# Patient Record
Sex: Female | Born: 1970 | Race: White | Hispanic: No | Marital: Married | Smoking: Former smoker
Health system: Southern US, Community
[De-identification: ages and names within clinical notes are randomized; demographics above are authoritative.]

## PROBLEM LIST (undated history)

## (undated) DIAGNOSIS — F419 Anxiety disorder, unspecified: Secondary | ICD-10-CM

## (undated) DIAGNOSIS — G510 Bell's palsy: Secondary | ICD-10-CM

## (undated) DIAGNOSIS — G43019 Migraine without aura, intractable, without status migrainosus: Principal | ICD-10-CM

## (undated) DIAGNOSIS — F32A Depression, unspecified: Secondary | ICD-10-CM

## (undated) DIAGNOSIS — G709 Myoneural disorder, unspecified: Secondary | ICD-10-CM

## (undated) DIAGNOSIS — F329 Major depressive disorder, single episode, unspecified: Secondary | ICD-10-CM

## (undated) DIAGNOSIS — J45909 Unspecified asthma, uncomplicated: Secondary | ICD-10-CM

## (undated) DIAGNOSIS — M797 Fibromyalgia: Secondary | ICD-10-CM

## (undated) DIAGNOSIS — G500 Trigeminal neuralgia: Secondary | ICD-10-CM

## (undated) DIAGNOSIS — K589 Irritable bowel syndrome without diarrhea: Secondary | ICD-10-CM

## (undated) HISTORY — PX: REDUCTION MAMMAPLASTY: SUR839

## (undated) HISTORY — PX: DILATION AND CURETTAGE OF UTERUS: SHX78

## (undated) HISTORY — PX: COLONOSCOPY: SHX174

## (undated) HISTORY — DX: Fibromyalgia: M79.7

## (undated) HISTORY — PX: WISDOM TOOTH EXTRACTION: SHX21

## (undated) HISTORY — PX: ABLATION: SHX5711

## (undated) HISTORY — DX: Migraine without aura, intractable, without status migrainosus: G43.019

## (undated) HISTORY — PX: OTHER SURGICAL HISTORY: SHX169

---

## 2006-07-11 ENCOUNTER — Emergency Department (HOSPITAL_COMMUNITY): Admission: EM | Admit: 2006-07-11 | Discharge: 2006-07-11 | Payer: Self-pay | Admitting: Emergency Medicine

## 2007-12-29 ENCOUNTER — Inpatient Hospital Stay (HOSPITAL_COMMUNITY): Admission: AD | Admit: 2007-12-29 | Discharge: 2007-12-29 | Payer: Self-pay | Admitting: Obstetrics & Gynecology

## 2008-01-01 ENCOUNTER — Inpatient Hospital Stay (HOSPITAL_COMMUNITY): Admission: AD | Admit: 2008-01-01 | Discharge: 2008-01-01 | Payer: Self-pay | Admitting: Obstetrics

## 2008-01-08 ENCOUNTER — Inpatient Hospital Stay (HOSPITAL_COMMUNITY): Admission: AD | Admit: 2008-01-08 | Discharge: 2008-01-11 | Payer: Self-pay | Admitting: Obstetrics & Gynecology

## 2008-08-03 ENCOUNTER — Encounter: Admission: RE | Admit: 2008-08-03 | Discharge: 2008-08-03 | Payer: Self-pay | Admitting: Obstetrics & Gynecology

## 2008-08-11 ENCOUNTER — Encounter: Admission: RE | Admit: 2008-08-11 | Discharge: 2008-08-11 | Payer: Self-pay | Admitting: Obstetrics & Gynecology

## 2010-04-13 ENCOUNTER — Ambulatory Visit (HOSPITAL_COMMUNITY): Admission: RE | Admit: 2010-04-13 | Discharge: 2010-04-13 | Payer: Self-pay | Admitting: Obstetrics

## 2010-09-20 ENCOUNTER — Other Ambulatory Visit: Payer: Self-pay | Admitting: Family Medicine

## 2010-09-20 ENCOUNTER — Other Ambulatory Visit
Admission: RE | Admit: 2010-09-20 | Discharge: 2010-09-20 | Payer: Self-pay | Source: Home / Self Care | Admitting: Family Medicine

## 2010-11-09 LAB — CBC
HCT: 38.1 % (ref 36.0–46.0)
MCV: 88.7 fL (ref 78.0–100.0)
RDW: 13.4 % (ref 11.5–15.5)

## 2010-11-09 LAB — PREGNANCY, URINE: Preg Test, Ur: NEGATIVE

## 2011-01-08 NOTE — Op Note (Signed)
NAMEHELI, DINO NO.:  [id number]   MEDICAL RECORD NO.:  [id number]          PATIENT TYPE:  INP   LOCATION:  9125                          FACILITY:  [facility]   PHYSICIAN:  Genia Del, M.D.DATE OF BIRTH:  1971/06/24   DATE OF PROCEDURE:  DATE OF DISCHARGE:                               OPERATIVE REPORT   PREOPERATIVE DIAGNOSES:  1. A 39+ weeks' gestation.  2. Spontaneous rupture of membranes.  3. Previous cesarean sections x2.   POSTOPERATIVE DIAGNOSES:  1. A 39+ weeks' gestation.  2. Spontaneous rupture of membranes.  3. Previous cesarean sections x2.   PROCEDURE:  Urgent repeat low-transverse cesarean section.   SURGEON:  Genia Del, MD   ANESTHESIOLOGIST:  Raul Del, MD   PROCEDURE:  Under spinal anesthesia, the patient was in 15-degree left  decubitus position.  She was prepped with Betadine on the abdominal,  suprapubic, vulvar, and vaginal areas.  The bladder was catheterized and  the Foley was left in place, and the patient was draped as usual.  The  level of anesthesia was verified and was adequate.  We infiltrated the  subcutaneous tissue with Marcaine one-quarter plain 20 mL at the site of  the previous C-section.  We make a Pfannenstiel incision at that site  with the scalpel.  We opened the adipose tissue with the electrocautery.  We opened the aponeurosis transversely with the electrocautery and the  Mayo scissors.  The recti muscles were separated from the aponeurosis on  the midline superiorly and inferiorly.  We then opened parietal  peritoneum longitudinally with Metzenbaum scissors.  We then inserted  the disposable circular retractor.  We opened the visceral peritoneum  over the lower uterine segment with Metzenbaum scissors and we reclined  the bladder downward.  A low-transverse hysterotomy was done with the  scalpel.  We extended on each side with dressing scissors.  The amniotic  fluid was clear.  The fetus was  in cephalic presentation.  A loose  nuchal cord was present.  Birth of a baby girl at 2:50.  The cord was  clamped and cut.  The baby was suctioned and given to the neonatal team.  Apgars were 8 and 9, weight was 7 pounds 3 ounces.  Extraction of the  placenta, it was complete with 3-vessel and sent to labor and delivery.  Ancef 1 g IV was given and Pitocin was started and the IV fluids.  The  uterus contracted well.  A uterine revision was done.  We then closed  the hysterotomy in a first locked running suture of Vicryl 0.  A second  plane was done in a mattress fashion with Vicryl 0, and an X stitch was  added at the right angle to complete hemostasis in a figure-of-eight.  We then confirmed that hemostasis was adequate at all levels.  Both  tubes and both ovaries were normal to inspection.  We removed the  retractor.  We then irrigated and suctioned the abdominopelvic cavities.  We closed the aponeurosis in two-half running sutures of Vicryl 0.  We  completed hemostasis at the  adipose  tissue with the electrocautery and reapproximated the skin with  staples.  A dry dressing was applied.  The count of instruments and  sponges was complete x2.  The estimated blood loss was 500 mL.  The  patient was transferred to recovery room in good stable status.  No  complications occurred.      Genia Del, M.D.  Electronically Signed     ML/MEDQ  D:  01/08/2008  T:  01/08/2008  Job:  [id number]

## 2011-01-08 NOTE — Consult Note (Signed)
NAMEJACARI, Shari NO.:  [id number]   MEDICAL RECORD NO.:  [id number]          PATIENT TYPE:  MAT   LOCATION:  MATC                          FACILITY:  [facility]   PHYSICIAN:  [place] B. Earlene Plater, M.D.  DATE OF BIRTH:  [birth year]   DATE OF CONSULTATION:  12/29/2007  DATE OF DISCHARGE:  12/29/2007                                 CONSULTATION   CHIEF COMPLAINT:  Status post fall.   HISTORY OF PRESENT ILLNESS:  A 40 year old white female gravida 4, para  3, presents at 21 weeks, status post fall.  She slept in her garage and  fell to her abdomen left and left knee and hand.  She reports general  soreness in her knee, but otherwise no complaints.  Good fetal  movements.  Occasional contractions, which is mild.  No bleeding or  leakage of fluid.  Prenatal care has otherwise been uncomplicated.   PAST MEDICAL HISTORY:  1. Anemia.  2. Irritable bowel syndrome.  3. Anxiety disorder.   PAST SURGICAL HISTORY:  C-section x2.   MEDICATIONS:  1. Advair.  2. Nexium.  3. Ventolin.  4. Prenatal vitamin.  5. Zyrtec.   ALLERGIES:  None.   SOCIAL HISTORY:  Noncontributory.   OBSTETRICAL HISTORY:  C-section x2.   PHYSICAL EXAMINATION:  The patient is afebrile.  Stable vital signs.  Fetal heart rate tracing shows fetal heart baseline of 140s, moderate  variability, and reactive tracing with an occasional contraction, which  the patient states is not painful.  Abdomen is not bruised.  There is no  fundal tenderness.  Cervix is closed and 50% effaced, vertex  presentation, no blood noted.  Knee is tender just below the patella and  soft tissue with vary subtle soft tissue swelling; otherwise, no  ligamentous tenderness or bony tenderness.  Left ankle is normal in  appearance, nontender.   LABS:  Kleihauer-Betke is negative.  X-rays of the left knee and left  ankle are normal.   ASSESSMENT:  1. Status post fall.  The patient has been monitored for 4 hours  approximately with no worsen signs, also her KB is negative.  She      is discharged home.  Bleeding and pain precautions given.  Follow      up in the office in a week.  2. Left knee and ankle pain.  No evidence of bony injury for      significant ligamentous injury on physical exam or x-rays.      Recommend ice and Tylenol.  If pain persist, recommend evaluation      in urgent care or her primary care physician's office.      Gerri Spore B. Earlene Plater, M.D.  Electronically Signed     WBD/MEDQ  D:  12/29/2007  T:  12/30/2007  Job:  [id number]

## 2011-01-11 NOTE — Discharge Summary (Signed)
NAMEERMELINDA, ECKERT NO.:  [id number]   MEDICAL RECORD NO.:  [id number]          PATIENT TYPE:  INP   LOCATION:  9125                          FACILITY:  [facility]   PHYSICIAN:  Genia Del, M.D.DATE OF BIRTH:  1970/10/10   DATE OF ADMISSION:  01/08/2008  DATE OF DISCHARGE:  01/11/2008                               DISCHARGE SUMMARY   ADMISSION DIAGNOSES:  1. 39+ weeks spontaneous rupture of membranes with spontaneous labor.  2. Previous cesarean section.   DISCHARGE DIAGNOSES:  1. 39+ weeks spontaneous rupture of membranes with spontaneous labor.  2. Previous cesarean section.   INTERVENTION:  Urgent repeat low transverse C-section on May 15,009,  birth of a baby girl.   HOSPITAL COURSE:  The patient had a repeat low transverse C-section  after rupturing her membranes resulting in spontaneous labor.  She had a  baby girl with Apgars of 8 and 9.  Weight was 7 pounds 3 ounces.  The  estimated blood loss during the surgery was 500 mL.  The patient had no  complication.  The postop evaluation was unremarkable.  She remained  stable and afebrile.  She was discharged on postop day #3 in good  status.  Postop advice were given and the patient will come back to  [facility] in 6 weeks.      Genia Del, M.D.  Electronically Signed     ML/MEDQ  D:  02/19/2008  T:  02/20/2008  Job:  [id number]

## 2011-05-22 LAB — CBC
HCT: 25 — ABNORMAL LOW
Hemoglobin: 8.6 — ABNORMAL LOW
Hemoglobin: 9.6 — ABNORMAL LOW
MCHC: 34.3
Platelets: 171
RBC: 2.99 — ABNORMAL LOW
RBC: 3.38 — ABNORMAL LOW
RDW: 12.8
WBC: 13.4 — ABNORMAL HIGH

## 2012-02-12 ENCOUNTER — Other Ambulatory Visit: Payer: Self-pay | Admitting: Family Medicine

## 2012-02-12 DIAGNOSIS — Z1231 Encounter for screening mammogram for malignant neoplasm of breast: Secondary | ICD-10-CM

## 2012-02-24 ENCOUNTER — Ambulatory Visit
Admission: RE | Admit: 2012-02-24 | Discharge: 2012-02-24 | Disposition: A | Discharge status: Home / Self Care | Payer: 59 | Source: Ambulatory Visit | Attending: Family Medicine | Admitting: Family Medicine

## 2012-02-24 DIAGNOSIS — Z1231 Encounter for screening mammogram for malignant neoplasm of breast: Secondary | ICD-10-CM

## 2013-01-21 ENCOUNTER — Other Ambulatory Visit: Payer: Self-pay

## 2013-01-21 DIAGNOSIS — Z1231 Encounter for screening mammogram for malignant neoplasm of breast: Secondary | ICD-10-CM

## 2013-02-24 ENCOUNTER — Ambulatory Visit: Payer: 59

## 2013-03-01 ENCOUNTER — Ambulatory Visit
Admission: RE | Admit: 2013-03-01 | Discharge: 2013-03-01 | Disposition: A | Discharge status: Home / Self Care | Payer: 59 | Source: Ambulatory Visit

## 2013-03-01 DIAGNOSIS — Z1231 Encounter for screening mammogram for malignant neoplasm of breast: Secondary | ICD-10-CM

## 2014-04-15 ENCOUNTER — Other Ambulatory Visit: Payer: Self-pay

## 2014-04-15 DIAGNOSIS — Z1231 Encounter for screening mammogram for malignant neoplasm of breast: Secondary | ICD-10-CM

## 2014-05-11 ENCOUNTER — Ambulatory Visit
Admission: RE | Admit: 2014-05-11 | Discharge: 2014-05-11 | Disposition: A | Discharge status: Home / Self Care | Payer: 59 | Source: Ambulatory Visit

## 2014-05-11 DIAGNOSIS — Z1231 Encounter for screening mammogram for malignant neoplasm of breast: Secondary | ICD-10-CM

## 2014-07-13 ENCOUNTER — Institutional Professional Consult (permissible substitution): Payer: 59 | Admitting: Neurology

## 2014-08-26 HISTORY — PX: ABDOMINAL HYSTERECTOMY: SHX81

## 2014-10-12 ENCOUNTER — Other Ambulatory Visit: Payer: Self-pay | Admitting: Gastroenterology

## 2014-11-04 ENCOUNTER — Other Ambulatory Visit: Payer: Self-pay | Admitting: Gastroenterology

## 2014-11-04 DIAGNOSIS — R1310 Dysphagia, unspecified: Secondary | ICD-10-CM

## 2014-11-21 ENCOUNTER — Ambulatory Visit
Admission: RE | Admit: 2014-11-21 | Discharge: 2014-11-21 | Disposition: A | Discharge status: Home / Self Care | Payer: 59 | Source: Ambulatory Visit | Attending: Gastroenterology | Admitting: Gastroenterology

## 2014-11-21 DIAGNOSIS — R1310 Dysphagia, unspecified: Secondary | ICD-10-CM

## 2015-01-03 ENCOUNTER — Other Ambulatory Visit: Payer: Self-pay | Admitting: Family Medicine

## 2015-01-03 DIAGNOSIS — R1900 Intra-abdominal and pelvic swelling, mass and lump, unspecified site: Secondary | ICD-10-CM

## 2015-01-04 ENCOUNTER — Ambulatory Visit
Admission: RE | Admit: 2015-01-04 | Discharge: 2015-01-04 | Disposition: A | Discharge status: Home / Self Care | Payer: 59 | Source: Ambulatory Visit | Attending: Family Medicine | Admitting: Family Medicine

## 2015-01-04 ENCOUNTER — Other Ambulatory Visit: Payer: Self-pay | Admitting: Family Medicine

## 2015-01-04 DIAGNOSIS — R1900 Intra-abdominal and pelvic swelling, mass and lump, unspecified site: Secondary | ICD-10-CM

## 2015-01-30 ENCOUNTER — Other Ambulatory Visit (HOSPITAL_COMMUNITY)
Admission: RE | Admit: 2015-01-30 | Discharge: 2015-01-30 | Disposition: A | Discharge status: Home / Self Care | Payer: 59 | Source: Ambulatory Visit | Attending: Family Medicine | Admitting: Family Medicine

## 2015-01-30 ENCOUNTER — Other Ambulatory Visit: Payer: Self-pay | Admitting: Family Medicine

## 2015-01-30 DIAGNOSIS — Z124 Encounter for screening for malignant neoplasm of cervix: Secondary | ICD-10-CM | POA: Insufficient documentation

## 2015-02-03 LAB — CYTOLOGY - PAP

## 2015-04-26 ENCOUNTER — Ambulatory Visit
Admission: RE | Admit: 2015-04-26 | Discharge: 2015-04-26 | Disposition: A | Discharge status: Home / Self Care | Payer: PRIVATE HEALTH INSURANCE | Source: Ambulatory Visit | Attending: Family Medicine | Admitting: Family Medicine

## 2015-04-26 ENCOUNTER — Other Ambulatory Visit: Payer: Self-pay | Admitting: Family Medicine

## 2015-04-26 DIAGNOSIS — M25552 Pain in left hip: Secondary | ICD-10-CM

## 2015-04-26 DIAGNOSIS — M545 Low back pain: Secondary | ICD-10-CM

## 2015-05-02 ENCOUNTER — Other Ambulatory Visit: Payer: Self-pay | Admitting: Family Medicine

## 2015-05-02 DIAGNOSIS — M545 Low back pain, unspecified: Secondary | ICD-10-CM

## 2015-05-10 ENCOUNTER — Other Ambulatory Visit: Payer: Self-pay

## 2015-05-17 ENCOUNTER — Ambulatory Visit
Admission: RE | Admit: 2015-05-17 | Discharge: 2015-05-17 | Disposition: A | Discharge status: Home / Self Care | Payer: PRIVATE HEALTH INSURANCE | Source: Ambulatory Visit | Attending: Family Medicine | Admitting: Family Medicine

## 2015-05-17 DIAGNOSIS — M545 Low back pain, unspecified: Secondary | ICD-10-CM

## 2015-05-26 ENCOUNTER — Other Ambulatory Visit: Payer: Self-pay | Admitting: Obstetrics

## 2015-06-07 ENCOUNTER — Encounter (HOSPITAL_COMMUNITY)
Admission: RE | Admit: 2015-06-07 | Discharge: 2015-06-07 | Disposition: A | Discharge status: Home / Self Care | Payer: 59 | Source: Ambulatory Visit | Attending: Obstetrics | Admitting: Obstetrics

## 2015-06-07 ENCOUNTER — Encounter (HOSPITAL_COMMUNITY): Payer: Self-pay

## 2015-06-07 DIAGNOSIS — Z01818 Encounter for other preprocedural examination: Secondary | ICD-10-CM | POA: Insufficient documentation

## 2015-06-07 DIAGNOSIS — R102 Pelvic and perineal pain: Secondary | ICD-10-CM | POA: Insufficient documentation

## 2015-06-07 HISTORY — DX: Bell's palsy: G51.0

## 2015-06-07 HISTORY — DX: Unspecified asthma, uncomplicated: J45.909

## 2015-06-07 HISTORY — DX: Anxiety disorder, unspecified: F41.9

## 2015-06-07 HISTORY — DX: Myoneural disorder, unspecified: G70.9

## 2015-06-07 LAB — TYPE AND SCREEN
ABO/RH(D): O POS
ANTIBODY SCREEN: NEGATIVE

## 2015-06-07 LAB — ABO/RH: ABO/RH(D): O POS

## 2015-06-07 LAB — CBC
HCT: 40.7 % (ref 36.0–46.0)
HEMOGLOBIN: 13.7 g/dL (ref 12.0–15.0)
MCH: 29.8 pg (ref 26.0–34.0)
MCHC: 33.7 g/dL (ref 30.0–36.0)
MCV: 88.5 fL (ref 78.0–100.0)
Platelets: 222 10*3/uL (ref 150–400)
RBC: 4.6 MIL/uL (ref 3.87–5.11)
RDW: 13 % (ref 11.5–15.5)
WBC: 7 10*3/uL (ref 4.0–10.5)

## 2015-06-07 NOTE — Patient Instructions (Signed)
Your procedure is scheduled on:  June 12, 2015  Enter through the Main Entrance of [facility] at: 11:30 am   Pick up the phone at the desk and dial [phone].  Call this number if you have problems the morning of surgery: [phone].  Remember: Do NOT eat food: after midnight on Sunday  Do NOT drink clear liquids after:  9:00 am day of surgery  Take these medicines the morning of surgery with a SIP OF WATER:  paxil    Do NOT wear jewelry (body piercing), metal hair clips/bobby pins, make-up, or nail polish. Do NOT wear lotions, powders, or perfumes.  You may wear deoderant. Do NOT shave for 48 hours prior to surgery. Do NOT bring valuables to the hospital. Contacts, dentures, or bridgework may not be worn into surgery. Leave suitcase in car.  After surgery it may be brought to your room.  For patients admitted to the hospital, checkout time is 11:00 AM the day of discharge.

## 2015-06-11 NOTE — H&P (Signed)
CC: here for TLH and b/l salpingectomy due to persistent LLQ pain and possible hematosalpynx.  HPI: 44 yo [id number] non-pregnant pt who has developed cyclic and severe LLQ pain over the past 5 months. Pt has a history of an endometrial ablation in 2011, after a normal endometrial biopsy. Pt has been contracepting with vasectomy and has been amenorrheic since ablation. Pt had a normal pelvic/ back MRI to evaluate her pain. She also had a pelvic u/s when the cyclic pain was at it's worst with the finding or a 3x3x3 cm thick walled cystic mass in L adnexa, abutting the L ovary. Normal R adnexa and normal uterus 7x4x3 cm, 40 cc. Pt has a normal pap with PCP 6/16. She remains tender in LLQ on exam.   Meds: Paxil. MVI, Zyrtec  All: no latex allergies, Phenergan  PE: Filed Vitals:   06/12/15 1144  BP: 100/68  Pulse: 63  Temp: 97.9 F (36.6 C)  TempSrc: Oral  Resp: 16  SpO2: 100%   Gen: well appearing, no distress Abd: soft, NT, Nt GU: def to OR LE: NT, no edema  CBC    Component Value Date/Time   WBC 7.0 06/07/2015 1125   RBC 4.60 06/07/2015 1125   HGB 13.7 06/07/2015 1125   HCT 40.7 06/07/2015 1125   PLT 222 06/07/2015 1125   MCV 88.5 06/07/2015 1125   MCH 29.8 06/07/2015 1125   MCHC 33.7 06/07/2015 1125   RDW 13.0 06/07/2015 1125      A/P: 44 yo G3P3 with h/o endometrial ablation and now 5 yrs later, development of cyclic LLQ pain and 3 cm thick walled mass in LLQ, 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 R/B surgery d/w pt. Will plan hysterectomy as definitive treatment for future hematometra and cyclic pain due to menstrual cycle. Plan b/l salpingectomy with ovarian retention. If left ovary involved in mass will plan LSO.   Marcelis Wissner A. 06/12/2015 1:15 PM

## 2015-06-12 ENCOUNTER — Ambulatory Visit (HOSPITAL_COMMUNITY): Payer: 59 | Admitting: Anesthesiology

## 2015-06-12 ENCOUNTER — Encounter (HOSPITAL_COMMUNITY): Payer: Self-pay

## 2015-06-12 ENCOUNTER — Ambulatory Visit (HOSPITAL_COMMUNITY)
Admission: AD | Admit: 2015-06-12 | Discharge: 2015-06-13 | Disposition: A | Discharge status: Home / Self Care | Payer: 59 | Source: Ambulatory Visit | Attending: Obstetrics | Admitting: Obstetrics

## 2015-06-12 ENCOUNTER — Encounter (HOSPITAL_COMMUNITY)
Admission: AD | Disposition: A | Discharge status: Home / Self Care | Payer: Self-pay | Source: Ambulatory Visit | Attending: Obstetrics

## 2015-06-12 DIAGNOSIS — N836 Hematosalpinx: Secondary | ICD-10-CM | POA: Insufficient documentation

## 2015-06-12 DIAGNOSIS — N838 Other noninflammatory disorders of ovary, fallopian tube and broad ligament: Secondary | ICD-10-CM | POA: Insufficient documentation

## 2015-06-12 DIAGNOSIS — Z90710 Acquired absence of both cervix and uterus: Secondary | ICD-10-CM | POA: Diagnosis present

## 2015-06-12 DIAGNOSIS — I959 Hypotension, unspecified: Secondary | ICD-10-CM | POA: Diagnosis not present

## 2015-06-12 DIAGNOSIS — R1032 Left lower quadrant pain: Secondary | ICD-10-CM | POA: Diagnosis present

## 2015-06-12 DIAGNOSIS — R102 Pelvic and perineal pain: Secondary | ICD-10-CM | POA: Diagnosis not present

## 2015-06-12 DIAGNOSIS — F17210 Nicotine dependence, cigarettes, uncomplicated: Secondary | ICD-10-CM | POA: Diagnosis not present

## 2015-06-12 HISTORY — PX: CYSTOSCOPY: SHX5120

## 2015-06-12 HISTORY — PX: ROBOTIC ASSISTED LAPAROSCOPIC LYSIS OF ADHESION: SHX6080

## 2015-06-12 LAB — PREGNANCY, URINE: Preg Test, Ur: NEGATIVE

## 2015-06-12 SURGERY — ROBOTIC ASSISTED TOTAL HYSTERECTOMY WITH SALPINGECTOMY
Anesthesia: General | Site: Urethra

## 2015-06-12 MED ORDER — LACTATED RINGERS IV BOLUS (SEPSIS)
500.0000 mL | Freq: Once | INTRAVENOUS | Status: AC
  Administered 2015-06-12: 500 mL via INTRAVENOUS

## 2015-06-12 MED ORDER — ROCURONIUM BROMIDE 100 MG/10ML IV SOLN
INTRAVENOUS | Status: DC | PRN
  Administered 2015-06-12: 10 mg via INTRAVENOUS
  Administered 2015-06-12: 40 mg via INTRAVENOUS
  Administered 2015-06-12 (×3): 10 mg via INTRAVENOUS

## 2015-06-12 MED ORDER — FENTANYL CITRATE (PF) 100 MCG/2ML IJ SOLN
INTRAMUSCULAR | Status: AC
  Filled 2015-06-12: qty 2

## 2015-06-12 MED ORDER — CEFAZOLIN SODIUM-DEXTROSE 2-3 GM-% IV SOLR
2.0000 g | INTRAVENOUS | Status: AC
  Administered 2015-06-12: 2 g via INTRAVENOUS

## 2015-06-12 MED ORDER — ONDANSETRON HCL 4 MG/2ML IJ SOLN
INTRAMUSCULAR | Status: DC | PRN
  Administered 2015-06-12: 4 mg via INTRAVENOUS

## 2015-06-12 MED ORDER — BUPIVACAINE HCL (PF) 0.25 % IJ SOLN
INTRAMUSCULAR | Status: DC | PRN
  Administered 2015-06-12: 7 mL

## 2015-06-12 MED ORDER — SCOPOLAMINE 1 MG/3DAYS TD PT72
1.0000 | MEDICATED_PATCH | Freq: Once | TRANSDERMAL | Status: DC
  Administered 2015-06-12: 1.5 mg via TRANSDERMAL

## 2015-06-12 MED ORDER — ONDANSETRON HCL 4 MG PO TABS: 4.0000 mg | ORAL_TABLET | Freq: Four times a day (QID) | ORAL | Status: DC | PRN

## 2015-06-12 MED ORDER — STERILE WATER FOR IRRIGATION IR SOLN
Status: DC | PRN
  Administered 2015-06-12: 3000 mL

## 2015-06-12 MED ORDER — IBUPROFEN 600 MG PO TABS
600.0000 mg | ORAL_TABLET | Freq: Four times a day (QID) | ORAL | Status: DC | PRN
  Administered 2015-06-13: 600 mg via ORAL
  Filled 2015-06-12: qty 1

## 2015-06-12 MED ORDER — ROPIVACAINE HCL 5 MG/ML IJ SOLN
INTRAMUSCULAR | Status: AC
  Filled 2015-06-12: qty 30

## 2015-06-12 MED ORDER — KETOROLAC TROMETHAMINE 30 MG/ML IJ SOLN: 30.0000 mg | Freq: Four times a day (QID) | INTRAMUSCULAR | Status: DC

## 2015-06-12 MED ORDER — DEXAMETHASONE SODIUM PHOSPHATE 10 MG/ML IJ SOLN
INTRAMUSCULAR | Status: DC | PRN
  Administered 2015-06-12: 4 mg via INTRAVENOUS

## 2015-06-12 MED ORDER — EPHEDRINE 5 MG/ML INJ
INTRAVENOUS | Status: AC
  Filled 2015-06-12: qty 10

## 2015-06-12 MED ORDER — SCOPOLAMINE 1 MG/3DAYS TD PT72
MEDICATED_PATCH | TRANSDERMAL | Status: DC
Start: 2015-06-12 — End: 2015-06-13
  Administered 2015-06-12: 1.5 mg via TRANSDERMAL
  Filled 2015-06-12: qty 1

## 2015-06-12 MED ORDER — LACTATED RINGERS IR SOLN
Status: DC | PRN
  Administered 2015-06-12: 3000 mL

## 2015-06-12 MED ORDER — KETOROLAC TROMETHAMINE 30 MG/ML IJ SOLN
30.0000 mg | Freq: Four times a day (QID) | INTRAMUSCULAR | Status: DC
  Administered 2015-06-12: 30 mg via INTRAVENOUS
  Filled 2015-06-12 (×2): qty 1

## 2015-06-12 MED ORDER — PANTOPRAZOLE SODIUM 40 MG PO TBEC
40.0000 mg | DELAYED_RELEASE_TABLET | Freq: Every day | ORAL | Status: DC
Start: 2015-06-13 — End: 2015-06-13

## 2015-06-12 MED ORDER — SODIUM CHLORIDE 0.9 % IJ SOLN
INTRAMUSCULAR | Status: AC
  Filled 2015-06-12: qty 50

## 2015-06-12 MED ORDER — PROPOFOL 10 MG/ML IV BOLUS
INTRAVENOUS | Status: AC
  Filled 2015-06-12: qty 20

## 2015-06-12 MED ORDER — MIDAZOLAM HCL 2 MG/2ML IJ SOLN
INTRAMUSCULAR | Status: AC
  Filled 2015-06-12: qty 4

## 2015-06-12 MED ORDER — CLONAZEPAM 0.5 MG PO TABS: 0.5000 mg | ORAL_TABLET | Freq: Two times a day (BID) | ORAL | Status: DC | PRN

## 2015-06-12 MED ORDER — LIDOCAINE HCL (CARDIAC) 20 MG/ML IV SOLN
INTRAVENOUS | Status: DC | PRN
  Administered 2015-06-12: 100 mg via INTRAVENOUS
  Administered 2015-06-12: 50 mg via INTRAVENOUS

## 2015-06-12 MED ORDER — LIDOCAINE HCL (CARDIAC) 20 MG/ML IV SOLN
INTRAVENOUS | Status: AC
  Filled 2015-06-12: qty 5

## 2015-06-12 MED ORDER — BUPIVACAINE HCL (PF) 0.75 % IJ SOLN
INTRAMUSCULAR | Status: AC
  Filled 2015-06-12: qty 30

## 2015-06-12 MED ORDER — NEOSTIGMINE METHYLSULFATE 10 MG/10ML IV SOLN
INTRAVENOUS | Status: DC | PRN
  Administered 2015-06-12: 2 mg via INTRAVENOUS
  Administered 2015-06-12: 1 mg via INTRAVENOUS

## 2015-06-12 MED ORDER — KETOROLAC TROMETHAMINE 30 MG/ML IJ SOLN
INTRAMUSCULAR | Status: DC | PRN
  Administered 2015-06-12: 30 mg via INTRAVENOUS

## 2015-06-12 MED ORDER — OXYCODONE-ACETAMINOPHEN 5-325 MG PO TABS
1.0000 | ORAL_TABLET | ORAL | Status: DC | PRN
  Administered 2015-06-13 (×2): 1 via ORAL
  Filled 2015-06-12 (×2): qty 1

## 2015-06-12 MED ORDER — LACTATED RINGERS IV SOLN
INTRAVENOUS | Status: DC
  Administered 2015-06-12 (×3): via INTRAVENOUS

## 2015-06-12 MED ORDER — FENTANYL CITRATE (PF) 100 MCG/2ML IJ SOLN
INTRAMUSCULAR | Status: DC | PRN
  Administered 2015-06-12: 100 ug via INTRAVENOUS
  Administered 2015-06-12 (×2): 50 ug via INTRAVENOUS

## 2015-06-12 MED ORDER — ONDANSETRON HCL 4 MG/2ML IJ SOLN: 4.0000 mg | Freq: Four times a day (QID) | INTRAMUSCULAR | Status: DC | PRN

## 2015-06-12 MED ORDER — EPHEDRINE SULFATE 50 MG/ML IJ SOLN
INTRAMUSCULAR | Status: DC | PRN
  Administered 2015-06-12: 5 mg via INTRAVENOUS
  Administered 2015-06-12 (×2): 10 mg via INTRAVENOUS

## 2015-06-12 MED ORDER — DEXAMETHASONE SODIUM PHOSPHATE 4 MG/ML IJ SOLN
INTRAMUSCULAR | Status: AC
  Filled 2015-06-12: qty 1

## 2015-06-12 MED ORDER — GLYCOPYRROLATE 0.2 MG/ML IJ SOLN
INTRAMUSCULAR | Status: DC | PRN
Start: 2015-06-12 — End: 2015-06-12
  Administered 2015-06-12: 0.1 mg via INTRAVENOUS
  Administered 2015-06-12: 0.2 mg via INTRAVENOUS
  Administered 2015-06-12: 0.4 mg via INTRAVENOUS
  Administered 2015-06-12: 0.1 mg via INTRAVENOUS

## 2015-06-12 MED ORDER — ARTIFICIAL TEARS OP OINT
TOPICAL_OINTMENT | OPHTHALMIC | Status: AC
  Filled 2015-06-12: qty 3.5

## 2015-06-12 MED ORDER — CEFAZOLIN SODIUM-DEXTROSE 2-3 GM-% IV SOLR
INTRAVENOUS | Status: AC
  Filled 2015-06-12: qty 50

## 2015-06-12 MED ORDER — ROCURONIUM BROMIDE 100 MG/10ML IV SOLN
INTRAVENOUS | Status: AC
  Filled 2015-06-12: qty 1

## 2015-06-12 MED ORDER — PROPOFOL 10 MG/ML IV BOLUS
INTRAVENOUS | Status: DC | PRN
  Administered 2015-06-12: 180 mg via INTRAVENOUS
  Administered 2015-06-12: 100 mg via INTRAVENOUS

## 2015-06-12 MED ORDER — BUPIVACAINE HCL (PF) 0.25 % IJ SOLN
INTRAMUSCULAR | Status: AC
  Filled 2015-06-12: qty 30

## 2015-06-12 MED ORDER — SIMETHICONE 80 MG PO CHEW: 80.0000 mg | CHEWABLE_TABLET | Freq: Four times a day (QID) | ORAL | Status: DC | PRN

## 2015-06-12 MED ORDER — ONDANSETRON HCL 4 MG/2ML IJ SOLN
INTRAMUSCULAR | Status: AC
  Filled 2015-06-12: qty 2

## 2015-06-12 MED ORDER — FENTANYL CITRATE (PF) 100 MCG/2ML IJ SOLN
25.0000 ug | INTRAMUSCULAR | Status: DC | PRN
  Administered 2015-06-12 (×2): 25 ug via INTRAVENOUS
  Administered 2015-06-12: 50 ug via INTRAVENOUS

## 2015-06-12 MED ORDER — MIDAZOLAM HCL 2 MG/2ML IJ SOLN
INTRAMUSCULAR | Status: DC | PRN
  Administered 2015-06-12: 2 mg via INTRAVENOUS

## 2015-06-12 MED ORDER — GLYCOPYRROLATE 0.2 MG/ML IJ SOLN
INTRAMUSCULAR | Status: AC
  Filled 2015-06-12: qty 1

## 2015-06-12 MED ORDER — SODIUM CHLORIDE 0.9 % IV SOLN: INTRAVENOUS | Status: DC

## 2015-06-12 MED ORDER — FENTANYL CITRATE (PF) 250 MCG/5ML IJ SOLN
INTRAMUSCULAR | Status: AC
  Filled 2015-06-12: qty 25

## 2015-06-12 MED ORDER — HYDROMORPHONE HCL 1 MG/ML IJ SOLN
1.0000 mg | INTRAMUSCULAR | Status: DC | PRN
  Administered 2015-06-12: 1 mg via INTRAVENOUS
  Filled 2015-06-12: qty 1

## 2015-06-12 MED ORDER — MENTHOL 3 MG MT LOZG
1.0000 | LOZENGE | OROMUCOSAL | Status: DC | PRN
Start: 2015-06-12 — End: 2015-06-13

## 2015-06-12 MED ORDER — ONDANSETRON HCL 4 MG/2ML IJ SOLN: 4.0000 mg | Freq: Once | INTRAMUSCULAR | Status: DC | PRN

## 2015-06-12 SURGICAL SUPPLY — 55 items
BARRIER ADHS 3X4 INTERCEED (GAUZE/BANDAGES/DRESSINGS) ×5 IMPLANT
CATH FOLEY 3WAY  5CC 16FR (CATHETERS) ×2
CATH FOLEY 3WAY 5CC 16FR (CATHETERS) ×3 IMPLANT
CLOTH BEACON ORANGE TIMEOUT ST (SAFETY) ×5 IMPLANT
CONT PATH 16OZ SNAP LID 3702 (MISCELLANEOUS) ×5 IMPLANT
COVER BACK TABLE 60X90IN (DRAPES) ×10 IMPLANT
COVER TIP SHEARS 8 DVNC (MISCELLANEOUS) ×3 IMPLANT
COVER TIP SHEARS 8MM DA VINCI (MISCELLANEOUS) ×2
DECANTER SPIKE VIAL GLASS SM (MISCELLANEOUS) ×5 IMPLANT
DRAPE WARM FLUID 44X44 (DRAPE) ×5 IMPLANT
DURAPREP 26ML APPLICATOR (WOUND CARE) ×5 IMPLANT
ELECT REM PT RETURN 9FT ADLT (ELECTROSURGICAL) ×5
ELECTRODE REM PT RTRN 9FT ADLT (ELECTROSURGICAL) ×3 IMPLANT
GAUZE VASELINE 3X9 (GAUZE/BANDAGES/DRESSINGS) IMPLANT
GLOVE BIO SURGEON STRL SZ 6.5 (GLOVE) ×4 IMPLANT
GLOVE BIO SURGEONS STRL SZ 6.5 (GLOVE) ×1
GLOVE BIOGEL PI IND STRL 7.0 (GLOVE) ×9 IMPLANT
GLOVE BIOGEL PI INDICATOR 7.0 (GLOVE) ×6
HEMOSTAT SURGICEL 2X14 (HEMOSTASIS) ×5 IMPLANT
KIT ACCESSORY DA VINCI DISP (KITS) ×2
KIT ACCESSORY DVNC DISP (KITS) ×3 IMPLANT
LEGGING LITHOTOMY PAIR STRL (DRAPES) ×5 IMPLANT
LIQUID BAND (GAUZE/BANDAGES/DRESSINGS) ×10 IMPLANT
OCCLUDER COLPOPNEUMO (BALLOONS) ×5 IMPLANT
PACK ROBOT WH (CUSTOM PROCEDURE TRAY) ×5 IMPLANT
PACK ROBOTIC GOWN (GOWN DISPOSABLE) ×5 IMPLANT
PAD POSITIONING PINK XL (MISCELLANEOUS) ×5 IMPLANT
PAD PREP 24X48 CUFFED NSTRL (MISCELLANEOUS) ×10 IMPLANT
PORT ACCESS TROCAR AIRSEAL 5 (TROCAR) ×5 IMPLANT
SET CYSTO W/LG BORE CLAMP LF (SET/KITS/TRAYS/PACK) ×5 IMPLANT
SET IRRIG TUBING LAPAROSCOPIC (IRRIGATION / IRRIGATOR) ×5 IMPLANT
SET TRI-LUMEN FLTR TB AIRSEAL (TUBING) ×5 IMPLANT
SUT VIC AB 0 CT1 27 (SUTURE) ×4
SUT VIC AB 0 CT1 27XBRD ANBCTR (SUTURE) ×6 IMPLANT
SUT VIC AB 4-0 PS2 27 (SUTURE) ×10 IMPLANT
SUT VICRYL 0 UR6 27IN ABS (SUTURE) ×10 IMPLANT
SUT VICRYL 4-0 PS2 18IN ABS (SUTURE) ×10 IMPLANT
SUT VLOC 180 0 9IN  GS21 (SUTURE) ×2
SUT VLOC 180 0 9IN GS21 (SUTURE) ×3 IMPLANT
SYR 50ML LL SCALE MARK (SYRINGE) ×5 IMPLANT
SYSTEM CONVERTIBLE TROCAR (TROCAR) ×5 IMPLANT
TIP RUMI ORANGE 6.7MMX12CM (TIP) IMPLANT
TIP UTERINE 5.1X6CM LAV DISP (MISCELLANEOUS) ×5 IMPLANT
TIP UTERINE 6.7X10CM GRN DISP (MISCELLANEOUS) IMPLANT
TIP UTERINE 6.7X6CM WHT DISP (MISCELLANEOUS) IMPLANT
TIP UTERINE 6.7X8CM BLUE DISP (MISCELLANEOUS) IMPLANT
TOWEL OR 17X24 6PK STRL BLUE (TOWEL DISPOSABLE) ×15 IMPLANT
TROCAR 12M 150ML BLUNT (TROCAR) ×5 IMPLANT
TROCAR DISP BLADELESS 8 DVNC (TROCAR) ×3 IMPLANT
TROCAR DISP BLADELESS 8MM (TROCAR) ×2
TROCAR HASSON GELL 12X100 (TROCAR) IMPLANT
TROCAR PORT AIRSEAL 8X100 (TROCAR) ×5 IMPLANT
TROCAR XCEL 12X100 BLDLESS (ENDOMECHANICALS) ×5 IMPLANT
WARMER LAPAROSCOPE (MISCELLANEOUS) ×5 IMPLANT
WATER STERILE IRR 1000ML POUR (IV SOLUTION) ×15 IMPLANT

## 2015-06-12 NOTE — Anesthesia Postprocedure Evaluation (Signed)
  Anesthesia Post-op Note  Patient: Shari Booth  Procedure(s) Performed: Procedure(s) (LRB): ROBOTIC ASSISTED TOTAL HYSTERECTOMY WITH BILATERAL SALPINGECTOMY (Bilateral) CYSTOSCOPY (N/A) ROBOTIC ASSISTED LAPAROSCOPIC LYSIS OF ADHESION  Patient Location: PACU  Anesthesia Type: General  Level of Consciousness: awake and alert   Airway and Oxygen Therapy: Patient Spontanous Breathing  Post-op Pain: mild  Post-op Assessment: Post-op Vital signs reviewed, Patient's Cardiovascular Status Stable, Respiratory Function Stable, Patent Airway and No signs of Nausea or vomiting  Last Vitals:  Filed Vitals:   06/12/15 1815  BP: 91/44  Pulse: 74  Temp:   Resp: 23    Post-op Vital Signs: stable   Complications: No apparent anesthesia complications

## 2015-06-12 NOTE — Anesthesia Preprocedure Evaluation (Addendum)
Anesthesia Evaluation  Patient identified by MRN, date of birth, ID band Patient awake    Reviewed: Allergy & Precautions, NPO status , Patient's Chart, lab work & pertinent test results  History of Anesthesia Complications Negative for: history of anesthetic complications  Airway Mallampati: II  TM Distance: >3 FB Neck ROM: Full    Dental no notable dental hx. (+) Dental Advisory Given   Pulmonary asthma , Current Smoker,    Pulmonary exam normal breath sounds clear to auscultation       Cardiovascular negative cardio ROS Normal cardiovascular exam Rhythm:Regular Rate:Normal     Neuro/Psych negative neurological ROS  negative psych ROS   GI/Hepatic negative GI ROS, Neg liver ROS,   Endo/Other  obesity  Renal/GU negative Renal ROS  negative genitourinary   Musculoskeletal negative musculoskeletal ROS (+)   Abdominal   Peds negative pediatric ROS (+)  Hematology negative hematology ROS (+)   Anesthesia Other Findings   Reproductive/Obstetrics negative OB ROS                            Anesthesia Physical Anesthesia Plan  ASA: II  Anesthesia Plan: General   Post-op Pain Management:    Induction: Intravenous  Airway Management Planned: Oral ETT  Additional Equipment:   Intra-op Plan:   Post-operative Plan: Extubation in OR  Informed Consent: I have reviewed the patients History and Physical, chart, labs and discussed the procedure including the risks, benefits and alternatives for the proposed anesthesia with the patient or authorized representative who has indicated his/her understanding and acceptance.   Dental advisory given  Plan Discussed with: CRNA  Anesthesia Plan Comments:         Anesthesia Quick Evaluation

## 2015-06-12 NOTE — Brief Op Note (Signed)
06/12/2015  5:40 PM  PATIENT:  Shari Booth  44 y.o. female  PRE-OPERATIVE DIAGNOSIS: Cyclic Pelvic Pain, presumed left hematosalpinx,  prior cesarean section 2, ,   POST-OPERATIVE DIAGNOSIS:  Cyclic pelvic pain, left hematosalpinx, bladder adhesions   PROCEDURE:  Procedure(s): ROBOTIC ASSISTED TOTAL HYSTERECTOMY WITH BILATERAL SALPINGECTOMY (Bilateral) CYSTOSCOPY (N/A) ROBOTIC ASSISTED LAPAROSCOPIC LYSIS OF ADHESION  SURGEON:  Surgeon(s) and Role:    * Aloha Gell, MD - Primary    * Azucena Fallen, MD - Assisting  PHYSICIAN ASSISTANT:   ASSISTANTSBenjie Karvonen, MD   ANESTHESIA:   local and general  EBL:  Total I/O In: 2000 [I.V.:2000] Out: 380 [Urine:305; Blood:75]  BLOOD ADMINISTERED:none  DRAINS: Urinary Catheter (Foley)   LOCAL MEDICATIONS USED:  MARCAINE     SPECIMEN:  Source of Specimen:  Uterus cervix bilateral fallopian tubes  DISPOSITION OF SPECIMEN:  PATHOLOGY  COUNTS:  YES  TOURNIQUET:  * No tourniquets in log *  DICTATION: .Note written in EPIC  PLAN OF CARE: Admit for overnight observation  PATIENT DISPOSITION:  PACU - hemodynamically stable.   Delay start of Pharmacological VTE agent (>24hrs) due to surgical blood loss or risk of bleeding: yes

## 2015-06-12 NOTE — Anesthesia Procedure Notes (Signed)
Procedure Name: Intubation Date/Time: 06/12/2015 1:28 PM Performed by: Casimer Lanius A Pre-anesthesia Checklist: Patient identified, Emergency Drugs available, Suction available and Patient being monitored Patient Re-evaluated:Patient Re-evaluated prior to inductionOxygen Delivery Method: Circle system utilized and Simple face mask Preoxygenation: Pre-oxygenation with 100% oxygen Intubation Type: IV induction Ventilation: Mask ventilation without difficulty Laryngoscope Size: Mac and 3 Grade View: Grade II Tube type: Oral Tube size: 7.0 mm Number of attempts: 1 Airway Equipment and Method: Stylet Placement Confirmation: ETT inserted through vocal cords under direct vision,  positive ETCO2 and breath sounds checked- equal and bilateral Secured at: 20 (right lip) cm Tube secured with: Tape Dental Injury: Teeth and Oropharynx as per pre-operative assessment

## 2015-06-12 NOTE — Op Note (Signed)
06/12/2015  5:40 PM  PATIENT:  Shari Booth  44 y.o. female  PRE-OPERATIVE DIAGNOSIS: Cyclic Pelvic Pain, presumed left hematosalpinx,  prior cesarean section 2, ,   POST-OPERATIVE DIAGNOSIS:  Cyclic pelvic pain, left hematosalpinx, bladder adhesions   PROCEDURE:  Procedure(s): ROBOTIC ASSISTED TOTAL HYSTERECTOMY WITH BILATERAL SALPINGECTOMY (Bilateral) CYSTOSCOPY (N/A) ROBOTIC ASSISTED LAPAROSCOPIC LYSIS OF ADHESION  SURGEON:  Surgeon(s) and Role:    * Aloha Gell, MD - Primary    * Azucena Fallen, MD - Assisting  PHYSICIAN ASSISTANT:   ASSISTANTSBenjie Karvonen, MD   ANESTHESIA:   local and general  EBL:  Total I/O In: 2000 [I.V.:2000] Out: 380 [Urine:305; Blood:75]  BLOOD ADMINISTERED:none  DRAINS: Urinary Catheter (Foley)   LOCAL MEDICATIONS USED:  MARCAINE     SPECIMEN:  Source of Specimen:  Uterus cervix bilateral fallopian tubes  DISPOSITION OF SPECIMEN:  PATHOLOGY  COUNTS:  YES  TOURNIQUET:  * No tourniquets in log *  DICTATION: .Note written in EPIC  PLAN OF CARE: Admit for overnight observation  PATIENT DISPOSITION:  PACU - hemodynamically stable.   Delay start of Pharmacological VTE agent (>24hrs) due to surgical blood loss or risk of bleeding: yes  Procedure:Robotic-assisted total laparoscopic hysterectomy, bilateral salpingectomy, lysis of adhesions, cystoscopy Complications: none Antibiotics: 2 g Ancef Findings: nl liver edge, normal uterus with significant anterior adhesions to the bladder and the anterior pelvic wall, normal right tube with simple paratubal cyst and normal right ovary, dilated left tube filled with old blood consistent with mesosalpinx with significant adhesions to the left ovary .  Hemostasis post-procedure with peristalsis of bilateral ureters and bilateral ureteral jets by cystoscopy  Indications: This is a 44 year old multiparous nonpregnant patient status post endometrial ablation who developed cyclic left lower quadrant  pain and ultrasound findings consistent with hematosalpinx. Options of expectant versus hormonal management discussed with patient who wants definitive surgical management.  Procedure: After informed consent and discussion of alternatives to hysterectomy, the patient was taken to the operating room where general anesthesia was initiated without difficulty. She was prepped and draped in normal sterile fashion in the dorsal supine lithotomy position. A Foley catheter was inserted sterilely into the bladder. A bimanual examination was done to assess the size and position of the uterus. A weighted speculum was placed in the vagina and deaver retractors were used on the anterior vaginal wall. Excessive posterior vaginal mucosal tissue was noted which limited easy visualization of the cervix..The cervix was grasped with tenaculum and a suture was placed on the anterior cervical lip to assist in placement of the uterine manipulator. The cervix was sounded to 5 cm. given her prior endometrial ablation significant intrauterine adhesions were noted. The  cervix was assessed to identify the Rumi-Co size.  A small cup and an 6 cm shaft was used. The uterine balloon was inflated but was only able to accommodate 2-1/2 cc of fluid due to intrauterine adhesions.  Gloved were changed. Attention was then turned to the patient's abdomen. 0.5 % marcaine was used prior to all incision. A total of 10 cc of marcaine was used.  A 10 mm incision was made in the umbilicus and blunt and sharp dissection was done until the fascia was identified. This was then grasped with Kocher clamps x2 and entered sharply. A pursestring suture of 0 Vicryl was then placed along the incision and a non-bladed Sheryle Hail was inserted into the peritoneal cavity. Intraperitoneal placement was confirmed with the use of the camera and pneumoperitoneum was created to  15 mm of mercury. The pursestring suture was secured around the port and pneumoperitoneum was  maintained. Brief survey of the abdomen and pelvis was done with findings as above. The abdominal wall was assessed and additional port sites were marked. 8 mm incisions were placed in the right (two) and left lower quadrants (2) and non-bladed ports were placed under direct visualization. The robot was brought to the patient's side and attached with the right side docking. The robotic instruments were placed under direct visualization until proper placement just over the uterus.  I then went to the robotic console. Brief survey of the patient's abdomen and pelvis revealed  findings as above. Significant adhesions were noted between the left tube and ovary and also between the uterus and the anterior abdominal wall. A small adhesion was noted between the left ovary and the bowel. This was transected sharply with the monopolar scissors. Additional adhesions were noted between the left bowel and the left mesial salpinx. These adhesions were dissected with monopolar scissors and limited use of cautery. It was important to dissect this away to allow the bowel to be displaced superiorly and freed from the pelvic organs.   I began on the left side to assess the adhesions between the left tube and ovary. These were fairly skin no clear plane was noted between the left tube, mesial salpinx and left ovary. As it was unclear whether I would be able to preserve the left ovary due to the adhesions I decided to start on the right side of the pelvis knowing that if the right ovary was retained there would be less risk in removing the left ovary.  The right ureter was identified in the right pelvic sidewall. The right tube was serially cauterized with bipolar cautery with the use of the PK device and transected with the monopolar scissors. The  right  utero-ovarian ligament was serially cauterized with the bipolar PK and transected with the monopolar scissors. The right round ligament was cauterized and divided.  The  anterior leaf of the broad ligament was dissected bluntly then monopolar cautery was used to separate the anterior and posterior leafs of the broad ligament. I was only able to carry this dissection a very short distance due to the significant anterior bladder wall adhesions.   The bladder was filled with 300 cc of fluid to better identify the borders of the bladder. Peritoneal adhesions were noted and these were taken down with sharp scissors and monopolar cautery. This allowed the uterus to have somewhat more mobility however the bladder was still densely adherent to the anterior uterine wall. Decision was made to focus on the adnexa and then return for the bladder dissection.  The left ureter was identified. Attempt was made to bluntly dissect between the left tube and the left ovary however these adhesions were quite dense. I then transected the left uterine ovarian ligament with first bipolar cautery then sharp scissors. This allowed a small window to be realized between the left fallopian tube and right ovary. Sharp dissection was done between the tube and the ovary and the mesial salpinx and eventually presented itself. The left infundibulopelvic ligament was followed and the attachment to the left ovary was noted. Starting distal to this connection the mesosalpinx was serially cauterized and transected. The dissection of the left tube was carried out with bipolar then sharp scissors. The mesosalpinx was dissected out to the prior dissection of the uterine ovarian ligament. About 20 minutes was spent dissecting the left tube  from the left ovary in order to allow ovarian retention. The left round ligament was then grasped placed on tension and transected the anterior posterior leafs of the left broad ligament were dissected with cautery and sharp scissors. Similar to the patient's right side this dissection was limited by the significant anterior bladder adhesions.  Placing the uterus on stretch in a  retroverted position the bladder was dissected free from the anterior abdominal wall. The bladder was filled 2 times during this long 45 minute process of freeing the bladder from the anterior uterine wall. Due to her dense scar tissue in prior cesarean section 2 great care was taken to avoid any bladder entry. Once the bladder was adequately dissected inferiorly securing of the uterine vessels was begun. Of Note this dissection took significantly longer than a standard case.    The right then left uterine artery and then serially cauterized with the PK and transected with the monopolar scissors. The uterus was placed on stretch to allow better visualization of the arteries. The bladder flap was further taken down both sharply and with cautery. Cautery was used for hemostasis on several places along the bladder flap. At this point with the pressure inward on the Rumi, the anterior colpotomy was attempted however poor identification of the Rumi cup cause dissection and transection of the cervix in the mid cervix. I then decided to create the posterior  colpotomy was made with the monopolar scissors. The proper level was realized on the posterior colpotomy. This was carried around to the patient's right side. Additional bipolar cautery was used on the  both angles of the cuff- this was carried out with the PK bipolar. The uterus was then positioned  posteriorly  to allow easy access to the  anterior  Colpotomy with great care to keep the dissection above the level of the bladder dissection. This was carried out with the monopolar scissors. Some bleeding was noted along the right angle of the incision and this was controlled with bipolar cautery.  Good hemostasis was then noted along the angles of the incision.  With manipulation of the specimen, the uterus, cervix and bilateral tubes were pulled out through the vagina.   Irrigation was used and the vaginal cuff appeared hemostatic. A 9 inch V-lock suture was  placed though the umbilical port. Instruments were changed to allow a suture cut needle driver through the #1 port and and a long-tipped forceps through the #2 port. The right angle was closed and locked with the V-lock suture. Running suture was carried out tothe L angle.  A more superficial layer of suture was placed travelling back to the right angle. The suture was cut and removed through the left lower quadrant port.    The utero-ovarian ligaments were reassessed also found to be hemostatic. The left ovary was reinspected and found to be hemostatic . All free fluid was removed from the abdomen. The robotic portion was completed. The robot was undocked.   By standard laparoscopy the pelvis was irrigated and evaluated.  The patient was placed in reverse Trendelenburg, all additional fluid was suctioned from the abdomen and pelvis the cuff was reinspected and  found to be hemostatic. A small amount of bleeding was noted along area of bladder dissection. a piece of Surgicel was placed on the area of bladder dissection. All pedicles were also found to be hemostatic.  All pedicles were also found to be hemostatic. Marland Kitchen Under direct visualization the ports were removed. Pneumoperitoneum was released and  the umbilical port was removed.  The  pursestring suture at the umbilicus was then closed. No additional fascial incisions were closed due to the small size and the nondilated nature of these incisions. A 4-0 Vicryl was used to close the additional laparoscopic ports sites. Good hemostasis was noted.  Cystoscopy was then carried out to ensure intact bladder and bilateral ureteral jets.Marland Kitchen    Sponge lap and needle counts were correct x3 the patient was woken from general anesthesia having tolerated the procedure well and taken to the recovery room in a stable fashion.

## 2015-06-12 NOTE — Transfer of Care (Signed)
Immediate Anesthesia Transfer of Care Note  Patient: Shari Booth  Procedure(s) Performed: Procedure(s): ROBOTIC ASSISTED TOTAL HYSTERECTOMY WITH BILATERAL SALPINGECTOMY (Bilateral) CYSTOSCOPY (N/A) ROBOTIC ASSISTED LAPAROSCOPIC LYSIS OF ADHESION  Patient Location: PACU  Anesthesia Type:General  Level of Consciousness: awake  Airway & Oxygen Therapy: Patient Spontanous Breathing  Post-op Assessment: Report given to PACU RN  Post vital signs: stable  Filed Vitals:   06/12/15 1144  BP: 100/68  Pulse: 63  Temp: 36.6 C  Resp: 16    Complications: No apparent anesthesia complications

## 2015-06-13 ENCOUNTER — Encounter (HOSPITAL_COMMUNITY): Payer: Self-pay | Admitting: Obstetrics

## 2015-06-13 DIAGNOSIS — R102 Pelvic and perineal pain: Secondary | ICD-10-CM | POA: Diagnosis not present

## 2015-06-13 LAB — CBC
HCT: 34.7 % — ABNORMAL LOW (ref 36.0–46.0)
HEMOGLOBIN: 11.6 g/dL — AB (ref 12.0–15.0)
MCH: 29.5 pg (ref 26.0–34.0)
MCHC: 33.4 g/dL (ref 30.0–36.0)
MCV: 88.3 fL (ref 78.0–100.0)
PLATELETS: 184 10*3/uL (ref 150–400)
RBC: 3.93 MIL/uL (ref 3.87–5.11)
RDW: 13.2 % (ref 11.5–15.5)
WBC: 10 10*3/uL (ref 4.0–10.5)

## 2015-06-13 MED ORDER — OXYCODONE-ACETAMINOPHEN 5-325 MG PO TABS: 1.0000 | ORAL_TABLET | ORAL | Status: DC | PRN

## 2015-06-13 NOTE — Discharge Summary (Signed)
Shari Booth MRN: [id number] DOB/AGE: May 20, 1971 44 y.o.  Admit date: 06/12/2015 Discharge date: 06/13/15  Admission Diagnoses: Pelvic Pain  Discharge Diagnoses: Pelvic Pain        Active Problems:   S/P laparoscopic hysterectomy   Discharged Condition: stable  Hospital Course: pt admitted for robotic assisted TLH and b/l salpingectomy. Significant bladder adhesions noted and LOA and cystoscopy added to OR plan. Post op did well with stable exam, vitals (asymptomatic hypotension) and CBC.   On POD# 1 pt notes pain controlled with minimal pain med use. No CP/ SOB. Out of bed x 2 w/o problems. Minimal vaginal spotting. Foley just removed and awaiting void. Poor sleep.   PE:  Filed Vitals:   06/12/15 2148 06/12/15 2332 06/13/15 0128 06/13/15 0515  BP: 86/41  93/40 79/45  Pulse: 79  66 66  Temp: 99.2 F (37.3 C)  98.1 F (36.7 C) 98.6 F (37 C)  TempSrc: Oral  Oral Oral  Resp: 20  18 16   Height:  5\' 4"  (1.626 m)    Weight:  181 lb 8 oz (82.328 kg)    SpO2: 94% 95% 98% 98%   Gen: well appearing, no distress CV: RRR Pulm: CTAB Abd: soft, approp tender, ND Inc: C/D/I0 dermabond GU: def LE: NT, no edema  CBC    Component Value Date/Time   WBC 10.0 06/13/2015 0513   RBC 3.93 06/13/2015 0513   HGB 11.6* 06/13/2015 0513   HCT 34.7* 06/13/2015 0513   PLT 184 06/13/2015 0513   MCV 88.3 06/13/2015 0513   MCH 29.5 06/13/2015 0513   MCHC 33.4 06/13/2015 0513   RDW 13.2 06/13/2015 0513      Consults: None  Treatments: surgery: TLH, b/l salpingectomy, LOA, cystoscopy  Disposition: to home     Medication List    TAKE these medications        clonazePAM 0.5 MG tablet  Commonly known as:  KLONOPIN  Take 0.5 mg by mouth daily as needed for anxiety.     ibuprofen 200 MG tablet  Commonly known as:  ADVIL,MOTRIN  Take 400-600 mg by mouth every 6 (six) hours as needed for moderate pain (depends on pain if patient takes 400mg  or 600mg ).     loratadine 10 MG  tablet  Commonly known as:  CLARITIN  Take 10 mg by mouth daily.     multivitamin with minerals Tabs tablet  Take 1 tablet by mouth daily.     oxyCODONE-acetaminophen 5-325 MG tablet  Commonly known as:  PERCOCET/ROXICET  Take 1-2 tablets by mouth every 4 (four) hours as needed for severe pain (moderate to severe pain (when tolerating fluids)).     PARoxetine 10 MG tablet  Commonly known as:  PAXIL  Take 10 mg by mouth daily.     PROBIOTIC PO  Take 1 tablet by mouth daily.         Signed: Ala Dach., MD 06/13/2015, 7:44 AM

## 2015-06-13 NOTE — Progress Notes (Signed)
Pt verbalizes understanding of d/c instructions, medications, follow up appointments, when to seek medical treatment and belongings policy. IV was removed by NT without complications. Pt has no questions at this time. Pt ambulated to main entrance, accompanied by NT. Pts husband is driving her home. Marry Guan

## 2015-06-13 NOTE — Anesthesia Postprocedure Evaluation (Signed)
  Anesthesia Post-op Note  Patient: Shari Booth DOBBIN  Procedure(s) Performed: Procedure(s): ROBOTIC ASSISTED TOTAL HYSTERECTOMY WITH BILATERAL SALPINGECTOMY (Bilateral) CYSTOSCOPY (N/A) ROBOTIC ASSISTED LAPAROSCOPIC LYSIS OF ADHESION  Patient Location: [facility]  Anesthesia Type:General  Level of Consciousness: awake, alert , oriented and patient cooperative  Airway and Oxygen Therapy: Patient Spontanous Breathing  Post-op Pain: mild  Post-op Assessment: Post-op Vital signs reviewed, Patient's Cardiovascular Status Stable, Respiratory Function Stable, Patent Airway, No signs of Nausea or vomiting, Adequate PO intake and Pain level controlled              Post-op Vital Signs: Reviewed and stable  Last Vitals:  Filed Vitals:   06/13/15 0515  BP: 79/45  Pulse: 66  Temp: 37 C  Resp: 16    Complications: No apparent anesthesia complications

## 2015-06-13 NOTE — Addendum Note (Signed)
Addendum  created 06/13/15 7416 by Brock Ra, CRNA   Modules edited: Notes Section   Notes Section:  File: [id number]

## 2015-12-27 ENCOUNTER — Emergency Department (HOSPITAL_BASED_OUTPATIENT_CLINIC_OR_DEPARTMENT_OTHER): Payer: 59

## 2015-12-27 ENCOUNTER — Encounter (HOSPITAL_BASED_OUTPATIENT_CLINIC_OR_DEPARTMENT_OTHER): Payer: Self-pay | Admitting: *Deleted

## 2015-12-27 ENCOUNTER — Emergency Department (HOSPITAL_BASED_OUTPATIENT_CLINIC_OR_DEPARTMENT_OTHER)
Admission: EM | Admit: 2015-12-27 | Discharge: 2015-12-27 | Disposition: A | Discharge status: Home / Self Care | Payer: 59 | Attending: Emergency Medicine | Admitting: Emergency Medicine

## 2015-12-27 DIAGNOSIS — J45909 Unspecified asthma, uncomplicated: Secondary | ICD-10-CM | POA: Insufficient documentation

## 2015-12-27 DIAGNOSIS — R079 Chest pain, unspecified: Secondary | ICD-10-CM | POA: Insufficient documentation

## 2015-12-27 DIAGNOSIS — F17210 Nicotine dependence, cigarettes, uncomplicated: Secondary | ICD-10-CM | POA: Insufficient documentation

## 2015-12-27 LAB — CBC WITH DIFFERENTIAL/PLATELET
Basophils Absolute: 0 10*3/uL (ref 0.0–0.1)
Basophils Relative: 0 %
EOS ABS: 0.3 10*3/uL (ref 0.0–0.7)
Eosinophils Relative: 3 %
HEMATOCRIT: 39.3 % (ref 36.0–46.0)
HEMOGLOBIN: 13.3 g/dL (ref 12.0–15.0)
LYMPHS ABS: 1.8 10*3/uL (ref 0.7–4.0)
LYMPHS PCT: 24 %
MCH: 29.7 pg (ref 26.0–34.0)
MCHC: 33.8 g/dL (ref 30.0–36.0)
MCV: 87.7 fL (ref 78.0–100.0)
Monocytes Absolute: 0.7 10*3/uL (ref 0.1–1.0)
Monocytes Relative: 10 %
NEUTROS ABS: 4.9 10*3/uL (ref 1.7–7.7)
NEUTROS PCT: 63 %
Platelets: 210 10*3/uL (ref 150–400)
RBC: 4.48 MIL/uL (ref 3.87–5.11)
RDW: 13.3 % (ref 11.5–15.5)
WBC: 7.8 10*3/uL (ref 4.0–10.5)

## 2015-12-27 LAB — BASIC METABOLIC PANEL
ANION GAP: 4 — AB (ref 5–15)
BUN: 10 mg/dL (ref 6–20)
CHLORIDE: 107 mmol/L (ref 101–111)
CO2: 27 mmol/L (ref 22–32)
Calcium: 8.6 mg/dL — ABNORMAL LOW (ref 8.9–10.3)
Creatinine, Ser: 0.79 mg/dL (ref 0.44–1.00)
GFR calc non Af Amer: >60 mL/min (ref >60)
Glucose, Bld: 96 mg/dL (ref 65–99)
POTASSIUM: 4.1 mmol/L (ref 3.5–5.1)
SODIUM: 138 mmol/L (ref 135–145)

## 2015-12-27 LAB — TROPONIN I: Troponin I: <0.03 ng/mL (ref <0.031)

## 2015-12-27 NOTE — ED Notes (Signed)
Pt c/o left side chest pain which radiation to back and  left arm x 3 weeks " off and on"

## 2015-12-27 NOTE — ED Provider Notes (Signed)
CSN: [id number]     Arrival date & time 12/27/15  1336 History   First MD Initiated Contact with Patient 12/27/15 1407     Chief Complaint  Patient presents with  . Chest Pain    Shari Booth is a 45 y.o. female who presents to the ED complaining of 3 weeks of intermittent left-sided chest pain. Patient reports she's been having left-sided chest pain and pressure intermittently for 3 weeks. She complains of 3 out of 10 left-sided chest pain currently. She reports sometimes her pain can be worse with walking. She also reports she has some intermittent tingling in her left arm today. No left arm pain. No arm weakness or numbness. She reports she did not have chest pain when she woke up this morning but it returned around 11 AM today. Patient is taking nothing for treatment of her symptoms today. She reports she is a smoker. She has a positive family history with her father having heart attack around the age of 61. She denies fevers, cough, shortness of breath, wheezing, leg pain, leg swelling, recent long travel, history of PEs or DVTs, endogenous estrogen use, neck pain, numbness, weakness, abdominal pain, nausea, vomiting or diarrhea. No personal or close family history of blood clotting disorders such as factor V Leiden, protein C or S deficiency.  The history is provided by the patient. No language interpreter was used.    Past Medical History  Diagnosis Date  . Asthma     with pregnancy only   . Anxiety   . Neuromuscular disorder ([place])     trigeminal nuralga   . Bell's palsy    Past Surgical History  Procedure Laterality Date  . Dilation and curettage of uterus    . Ceserean section x 3    . Ablation    . Cystoscopy N/A 06/12/2015    Procedure: CYSTOSCOPY;  Surgeon: Aloha Gell, MD;  Location: [facility];  Service: Gynecology;  Laterality: N/A;  . Robotic assisted laparoscopic lysis of adhesion  06/12/2015    Procedure: ROBOTIC ASSISTED LAPAROSCOPIC LYSIS OF ADHESION;  Surgeon:  Aloha Gell, MD;  Location: [facility];  Service: Gynecology;;  . Abdominal hysterectomy     History reviewed. No pertinent family history. Social History  Substance Use Topics  . Smoking status: Current Some Day Smoker    Types: Cigarettes  . Smokeless tobacco: Never Used  . Alcohol Use: Yes     Comment: social    OB History    No data available     Review of Systems  Constitutional: Negative for fever and chills.  HENT: Negative for congestion and sore throat.   Eyes: Negative for visual disturbance.  Respiratory: Negative for cough and shortness of breath.   Cardiovascular: Positive for chest pain. Negative for palpitations and leg swelling.  Gastrointestinal: Negative for nausea, vomiting, abdominal pain and diarrhea.  Genitourinary: Negative for dysuria.  Musculoskeletal: Negative for back pain and neck pain.  Skin: Negative for rash.  Neurological: Negative for dizziness, syncope, weakness, light-headedness and headaches.      Allergies  Review of patient's allergies indicates no known allergies.  Home Medications   Prior to Admission medications   Medication Sig Start Date End Date Taking? Authorizing Provider  clonazePAM (KLONOPIN) 0.5 MG tablet Take 0.5 mg by mouth daily as needed for anxiety.    Historical Provider, MD  ibuprofen (ADVIL,MOTRIN) 200 MG tablet Take 400-600 mg by mouth every 6 (six) hours as needed for moderate pain (depends on  pain if patient takes 400mg  or 600mg ).    Historical Provider, MD  loratadine (CLARITIN) 10 MG tablet Take 10 mg by mouth daily.    Historical Provider, MD  Multiple Vitamin (MULTIVITAMIN WITH MINERALS) TABS tablet Take 1 tablet by mouth daily.    Historical Provider, MD  oxyCODONE-acetaminophen (PERCOCET/ROXICET) 5-325 MG tablet Take 1-2 tablets by mouth every 4 (four) hours as needed for severe pain (moderate to severe pain (when tolerating fluids)). 06/13/15   Aloha Gell, MD  PARoxetine (PAXIL) 10 MG tablet Take 10 mg by  mouth daily.    Historical Provider, MD  Probiotic Product (PROBIOTIC PO) Take 1 tablet by mouth daily.    Historical Provider, MD   BP 102/70 mmHg  Pulse 57  Temp(Src) 98.1 F (36.7 C)  Resp 16  Ht 5\' 4"  (1.626 m)  Wt 81.647 kg  BMI 30.88 kg/m2  SpO2 100% Physical Exam  Constitutional: She appears well-developed and well-nourished. No distress.  Nontoxic appearing.  HENT:  Head: Normocephalic and atraumatic.  Mouth/Throat: Oropharynx is clear and moist.  Eyes: Conjunctivae are normal. Pupils are equal, round, and reactive to light. Right eye exhibits no discharge. Left eye exhibits no discharge.  Neck: Normal range of motion. Neck supple. No JVD present. No tracheal deviation present.  Cardiovascular: Normal rate, regular rhythm, normal heart sounds and intact distal pulses.  Exam reveals no gallop and no friction rub.   No murmur heard. Bilateral radial and posterior tibialis pulses are  intact.  Pulmonary/Chest: Effort normal and breath sounds normal. No stridor. No respiratory distress. She has no wheezes. She has no rales. She exhibits no tenderness.  Lungs are clear to auscultation bilaterally. No chest wall tenderness.  Abdominal: Soft. There is no tenderness. There is no guarding.  Musculoskeletal: She exhibits no edema or tenderness.  No lower extremity edema or tenderness.  Lymphadenopathy:    She has no cervical adenopathy.  Neurological: She is alert. Coordination normal.  Skin: Skin is warm and dry. No rash noted. She is not diaphoretic. No erythema. No pallor.  Psychiatric: She has a normal mood and affect. Her behavior is normal.  Nursing note and vitals reviewed.   ED Course  Procedures (including critical care time) Labs Review Labs Reviewed  BASIC METABOLIC PANEL - Abnormal; Notable for the following:    Calcium 8.6 (*)    Anion gap 4 (*)    All other components within normal limits  CBC WITH DIFFERENTIAL/PLATELET  TROPONIN I    Imaging Review Dg  Chest 2 View  12/27/2015  CLINICAL DATA:  Left-sided chest pain for 2-3 hours. EXAM: CHEST  2 VIEW COMPARISON:  None. FINDINGS: The cardiac silhouette, mediastinal and hilar contours are normal. The lungs are clear. No pleural effusion. The bony thorax is intact. IMPRESSION: Normal chest x-ray. Electronically Signed   By: Marijo Sanes M.D.   On: 12/27/2015 14:39   I have personally reviewed and evaluated these images and lab results as part of my medical decision-making.   EKG Interpretation   Date/Time:  Wednesday Dec 27 2015 13:57:50 EDT Ventricular Rate:  68 PR Interval:  174 QRS Duration: 91 QT Interval:  381 QTC Calculation: 405 R Axis:   57 Text Interpretation:  Sinus rhythm RSR' in V1 or V2, probably normal  variant Confirmed by [facility] MD, JASON [phone]) on 12/27/2015 2:41:24 PM      Filed Vitals:   12/27/15 1346 12/27/15 1500 12/27/15 1600  BP: 108/57 94/67 102/70  Pulse: 72 55 57  Temp: 98.1 F (36.7 C)    Resp: 16 17 16   Height: 5\' 4"  (1.626 m)    Weight: 81.647 kg    SpO2: 100% 94% 100%     MDM   Meds given in ED:  Medications - No data to display  New Prescriptions   No medications on file    Final diagnoses:  Chest pain, unspecified chest pain type   This is a 45 y.o. female who presents to the ED complaining of 3 weeks of intermittent left-sided chest pain. Patient reports she's been having left-sided chest pain and pressure intermittently for 3 weeks. She complains of 3 out of 10 left-sided chest pain currently. She reports sometimes her pain can be worse with walking. She also reports she has some intermittent tingling in her left arm today. No left arm pain. No arm weakness or numbness. She reports she did not have chest pain when she woke up this morning but it returned around 11 AM today. On exam the patient is afebrile nontoxic appearing. No tracheal deviation, no JVD or new murmur, RRR, breath sounds equal bilaterally, EKG without acute abnormalities,  negative troponin, and negative CXR. HEART score is 2. She has had no shortness of breath. No tachypnea, tachycardia or hypoxia. I have low suspicion for PE.  As her pain returned today around 11 will check a delta troponin. I suspected the patient still troponin is not elevated she can be discharged with follow-up by primary care and cardiology for stress test.  I discussed this plan with the patient and she agrees.  At shift change patient is awaiting delta troponin, patient care handed off to Domenic Moras, PA-C at shift change who will disposition the patient.   This patient was discussed with Dr. Dayna Barker who agrees with assessment and plan.     Waynetta Pean, PA-C 12/27/15 [id number]  Merrily Pew, MD 12/28/15 1754

## 2015-12-27 NOTE — Discharge Instructions (Signed)
Nonspecific Chest Pain  °Chest pain can be caused by many different conditions. There is always a chance that your pain could be related to something serious, such as a heart attack or a blood clot in your lungs. Chest pain can also be caused by conditions that are not life-threatening. If you have chest pain, it is very important to follow up with your health care provider. °CAUSES  °Chest pain can be caused by: °· Heartburn. °· Pneumonia or bronchitis. °· Anxiety or stress. °· Inflammation around your heart (pericarditis) or lung (pleuritis or pleurisy). °· A blood clot in your lung. °· A collapsed lung (pneumothorax). It can develop suddenly on its own (spontaneous pneumothorax) or from trauma to the chest. °· Shingles infection (varicella-zoster virus). °· Heart attack. °· Damage to the bones, muscles, and cartilage that make up your chest wall. This can include: °¨ Bruised bones due to injury. °¨ Strained muscles or cartilage due to frequent or repeated coughing or overwork. °¨ Fracture to one or more ribs. °¨ Sore cartilage due to inflammation (costochondritis). °RISK FACTORS  °Risk factors for chest pain may include: °· Activities that increase your risk for trauma or injury to your chest. °· Respiratory infections or conditions that cause frequent coughing. °· Medical conditions or overeating that can cause heartburn. °· Heart disease or family history of heart disease. °· Conditions or health behaviors that increase your risk of developing a blood clot. °· Having had chicken pox (varicella zoster). °SIGNS AND SYMPTOMS °Chest pain can feel like: °· Burning or tingling on the surface of your chest or deep in your chest. °· Crushing, pressure, aching, or squeezing pain. °· Dull or sharp pain that is worse when you move, cough, or take a deep breath. °· Pain that is also felt in your back, neck, shoulder, or arm, or pain that spreads to any of these areas. °Your chest pain may come and go, or it may stay  constant. °DIAGNOSIS °Lab tests or other studies may be needed to find the cause of your pain. Your health care provider may have you take a test called an ambulatory ECG (electrocardiogram). An ECG records your heartbeat patterns at the time the test is performed. You may also have other tests, such as: °· Transthoracic echocardiogram (TTE). During echocardiography, sound waves are used to create a picture of all of the heart structures and to look at how blood flows through your heart. °· Transesophageal echocardiogram (TEE). This is a more advanced imaging test that obtains images from inside your body. It allows your health care provider to see your heart in finer detail. °· Cardiac monitoring. This allows your health care provider to monitor your heart rate and rhythm in real time. °· Holter monitor. This is a portable device that records your heartbeat and can help to diagnose abnormal heartbeats. It allows your health care provider to track your heart activity for several days, if needed. °· Stress tests. These can be done through exercise or by taking medicine that makes your heart beat more quickly. °· Blood tests. °· Imaging tests. °TREATMENT  °Your treatment depends on what is causing your chest pain. Treatment may include: °· Medicines. These may include: °¨ Acid blockers for heartburn. °¨ Anti-inflammatory medicine. °¨ Pain medicine for inflammatory conditions. °¨ Antibiotic medicine, if an infection is present. °¨ Medicines to dissolve blood clots. °¨ Medicines to treat coronary artery disease. °· Supportive care for conditions that do not require medicines. This may include: °¨ Resting. °¨ Applying heat   or cold packs to injured areas. °¨ Limiting activities until pain decreases. °HOME CARE INSTRUCTIONS °· If you were prescribed an antibiotic medicine, finish it all even if you start to feel better. °· Avoid any activities that bring on chest pain. °· Do not use any tobacco products, including  cigarettes, chewing tobacco, or electronic cigarettes. If you need help quitting, ask your health care provider. °· Do not drink alcohol. °· Take medicines only as directed by your health care provider. °· Keep all follow-up visits as directed by your health care provider. This is important. This includes any further testing if your chest pain does not go away. °· If heartburn is the cause for your chest pain, you may be told to keep your head raised (elevated) while sleeping. This reduces the chance that acid will go from your stomach into your esophagus. °· Make lifestyle changes as directed by your health care provider. These may include: °¨ Getting regular exercise. Ask your health care provider to suggest some activities that are safe for you. °¨ Eating a heart-healthy diet. A registered dietitian can help you to learn healthy eating options. °¨ Maintaining a healthy weight. °¨ Managing diabetes, if necessary. °¨ Reducing stress. °SEEK MEDICAL CARE IF: °· Your chest pain does not go away after treatment. °· You have a rash with blisters on your chest. °· You have a fever. °SEEK IMMEDIATE MEDICAL CARE IF:  °· Your chest pain is worse. °· You have an increasing cough, or you cough up blood. °· You have severe abdominal pain. °· You have severe weakness. °· You faint. °· You have chills. °· You have sudden, unexplained chest discomfort. °· You have sudden, unexplained discomfort in your arms, back, neck, or jaw. °· You have shortness of breath at any time. °· You suddenly start to sweat, or your skin gets clammy. °· You feel nauseous or you vomit. °· You suddenly feel light-headed or dizzy. °· Your heart begins to beat quickly, or it feels like it is skipping beats. °These symptoms may represent a serious problem that is an emergency. Do not wait to see if the symptoms will go away. Get medical help right away. Call your local emergency services (911 in the [place] Do not drive yourself to the hospital. °  °This  information is not intended to replace advice given to you by your health care provider. Make sure you discuss any questions you have with your health care provider. °  °Document Released: 05/22/2005 Document Revised: 09/02/2014 Document Reviewed: 03/18/2014 °Elsevier Interactive Patient Education ©2016 [employer] ° °

## 2016-01-10 DIAGNOSIS — K219 Gastro-esophageal reflux disease without esophagitis: Secondary | ICD-10-CM | POA: Insufficient documentation

## 2016-01-10 DIAGNOSIS — R5383 Other fatigue: Secondary | ICD-10-CM | POA: Insufficient documentation

## 2016-01-10 DIAGNOSIS — F988 Other specified behavioral and emotional disorders with onset usually occurring in childhood and adolescence: Secondary | ICD-10-CM | POA: Insufficient documentation

## 2016-07-15 ENCOUNTER — Ambulatory Visit: Payer: 59 | Admitting: Neurology

## 2016-07-29 ENCOUNTER — Ambulatory Visit (INDEPENDENT_AMBULATORY_CARE_PROVIDER_SITE_OTHER): Payer: 59 | Admitting: Neurology

## 2016-07-29 ENCOUNTER — Encounter: Payer: Self-pay | Admitting: Neurology

## 2016-07-29 DIAGNOSIS — G43711 Chronic migraine without aura, intractable, with status migrainosus: Secondary | ICD-10-CM | POA: Insufficient documentation

## 2016-07-29 DIAGNOSIS — G43019 Migraine without aura, intractable, without status migrainosus: Secondary | ICD-10-CM | POA: Diagnosis not present

## 2016-07-29 HISTORY — DX: Migraine without aura, intractable, without status migrainosus: G43.019

## 2016-07-29 MED ORDER — TOPIRAMATE 25 MG PO TABS: ORAL_TABLET | ORAL | 3 refills | Status: DC

## 2016-07-29 NOTE — Progress Notes (Signed)
Reason for visit: Headache  Referring physician: Dr. Domenica Reamer is a 45 y.o. female  History of present illness:  [name] is a 45 year old right-handed white female with a history of chronic daily headaches. The patient has been having headaches off and on since she was in her 60s, but the headaches have gone from occurring 3 or 4 times a week to daily over the last 2-3 months. The headaches are bifrontal and bitemporal in nature and are associated with a throbbing aching pain that is better in the morning, worse as the day goes on. The patient may have some photophobia and phonophobia with the headache, but no nausea or vomiting. She may have some difficulty with concentration. She will lie down with the headache, and take Advil which seems to help within about 45 minutes. The patient may miss some work because of the headache, the patient works part-time. The patient does have a family history of migraine in the mother and in the daughter. She indicates that she is sleeping well. 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 The patient denies any neck stiffness, or allergy symptoms. She reports no numbness or weakness of the face, arms, or legs. She denies any issues with balance or difficulty controlling the bowels or the bladder. She is sent to this office for an evaluation.  Past Medical History:  Diagnosis Date  . Anxiety   . Asthma    with pregnancy only   . Bell's palsy   . Fibromyalgia   . Neuromuscular disorder ([place])    trigeminal nuralga     Past Surgical History:  Procedure Laterality Date  . ABDOMINAL HYSTERECTOMY    . ABLATION    . ceserean section x 3    . CYSTOSCOPY N/A 06/12/2015   Procedure: CYSTOSCOPY;  Surgeon: Aloha Gell, MD;  Location: [facility];  Service: Gynecology;  Laterality: N/A;    . DILATION AND CURETTAGE OF UTERUS    . ROBOTIC ASSISTED LAPAROSCOPIC LYSIS OF ADHESION  06/12/2015   Procedure: ROBOTIC ASSISTED LAPAROSCOPIC LYSIS OF ADHESION;  Surgeon: Aloha Gell, MD;  Location: [facility];  Service: Gynecology;;    Family History  Problem Relation Age of Onset  . High blood pressure Mother   . High Cholesterol Father   . Aortic stenosis Father   . Arrhythmia Father     Pacemaker  . Breast cancer Maternal Grandmother   . Breast cancer Maternal Aunt   . Lung cancer Maternal Aunt     Social history:  reports that she has been smoking Cigarettes.  She has never used smokeless tobacco. She reports that she drinks alcohol. She reports that she does not use drugs.  Medications:  Prior to Admission medications   Medication Sig Start Date End Date Taking? Authorizing Provider  clonazePAM (KLONOPIN) 0.5 MG tablet Take 0.5 mg by mouth daily as needed for anxiety.   Yes Historical Provider, MD  ibuprofen (ADVIL,MOTRIN) 200 MG tablet Take 400-600 mg by mouth every 6 (six) hours as needed for moderate pain (depends on pain if patient takes 400mg  or 600mg ).   Yes Historical Provider, MD  loratadine (CLARITIN) 10 MG tablet Take 10 mg by mouth daily.   Yes Historical Provider, MD  Multiple Vitamin (MULTIVITAMIN WITH MINERALS) TABS tablet Take 1 tablet by mouth daily.  Yes Historical Provider, MD  PARoxetine (PAXIL) 10 MG tablet Take 10 mg by mouth daily.   Yes Historical Provider, MD  Probiotic Product (PROBIOTIC PO) Take 1 tablet by mouth daily.   Yes Historical Provider, MD     No Known Allergies  ROS:  Out of a complete 14 system review of symptoms, the patient complains only of the following symptoms, and all other reviewed systems are negative.  Fatigue Feeling hot, cold Achy muscles Allergies Memory loss, headache, numbness, weakness, dizziness Anxiety, too much sleep, not enough sleep, decreased energy Insomnia, sleepiness  Blood pressure 103/73, pulse 61,  height 5' 4.5" (1.638 m), weight 165 lb (74.8 kg).  Physical Exam  General: The patient is alert and cooperative at the time of the examination.  Eyes: Pupils are equal, round, and reactive to light. Discs are flat bilaterally.  Neck: The neck is supple, no carotid bruits are noted.  Respiratory: The respiratory examination is clear.  Cardiovascular: The cardiovascular examination reveals a regular rate and rhythm, no obvious murmurs or rubs are noted.  Skin: Extremities are without significant edema.  Neurologic Exam  Mental status: The patient is alert and oriented x 3 at the time of the examination. The patient has apparent normal recent and remote memory, with an apparently normal attention span and concentration ability.  Cranial nerves: Facial symmetry is present. There is good sensation of the face to pinprick and soft touch bilaterally. The strength of the facial muscles and the muscles to head turning and shoulder shrug are normal bilaterally. Speech is well enunciated, no aphasia or dysarthria is noted. Extraocular movements are full. Visual fields are full. The tongue is midline, and the patient has symmetric elevation of the soft palate. No obvious hearing deficits are noted.  Motor: The motor testing reveals 5 over 5 strength of all 4 extremities. Good symmetric motor tone is noted throughout.  Sensory: Sensory testing is intact to pinprick, soft touch, vibration sensation, and position sense on all 4 extremities. No evidence of extinction is noted.  Coordination: Cerebellar testing reveals good finger-nose-finger and heel-to-shin bilaterally.  Gait and station: Gait is normal. Tandem gait is normal. Romberg is negative. No drift is seen.  Reflexes: Deep tendon reflexes are symmetric and normal bilaterally. Toes are downgoing bilaterally.   Assessment/Plan:  1. Intractable headache, and migraine  2. History of right V3 distribution trigeminal neuralgia  The patient  is having daily headaches at this time, she will be started on Topamax, working up on the dose. The patient is taking Advil for the headache with some benefit, she will continue doing this. She will follow-up in 3 or 4 months, she will call for dose adjustments of the Topamax or if she is not tolerating the medication.  Jill Alexanders MD 07/29/2016 9:00 AM  [facility] [address] [place], [place] [address]  Phone [phone] Fax [phone]

## 2016-07-29 NOTE — Patient Instructions (Signed)
   Topamax (topiramate) is a seizure medication that has an FDA approval for seizures and for migraine headache. Potential side effects of this medication include weight loss, cognitive slowing, tingling in the fingers and toes, and carbonated drinks will taste bad. If any significant side effects are noted on this drug, please contact our office.  

## 2016-08-29 ENCOUNTER — Telehealth: Payer: Self-pay | Admitting: Neurology

## 2016-08-29 MED ORDER — ZONISAMIDE 25 MG PO CAPS: ORAL_CAPSULE | ORAL | 2 refills | Status: DC

## 2016-08-29 NOTE — Telephone Encounter (Signed)
Pt w/ h/o headaches and trigeminal neuralgia was seen 1 month ago and started on Topamax. Now c/o side effects of numbness/tingling in hands and feet.

## 2016-08-29 NOTE — Telephone Encounter (Signed)
Patient called with concerns about Topamax stating she is having finger and toe numbness more on the left side over the past week.  Please call

## 2016-08-29 NOTE — Telephone Encounter (Signed)
I called patient. The patient is getting paresthesias on the Topamax, she feels that she cannot tolerate the side effect. She has already gone down to 1 tablet of Topamax daily, she will stop the medication after 5 days. We'll start Zonegran to replace the Topamax.

## 2016-08-29 NOTE — Addendum Note (Signed)
Addended by: Margette Fast on: 08/29/2016 05:26 PM   Modules accepted: Orders

## 2016-10-10 ENCOUNTER — Other Ambulatory Visit: Payer: Self-pay | Admitting: Family Medicine

## 2016-10-10 DIAGNOSIS — Z1231 Encounter for screening mammogram for malignant neoplasm of breast: Secondary | ICD-10-CM

## 2016-10-14 DIAGNOSIS — L739 Follicular disorder, unspecified: Secondary | ICD-10-CM | POA: Diagnosis not present

## 2016-10-23 ENCOUNTER — Ambulatory Visit
Admission: RE | Admit: 2016-10-23 | Discharge: 2016-10-23 | Disposition: A | Discharge status: Home / Self Care | Payer: 59 | Source: Ambulatory Visit | Attending: Family Medicine | Admitting: Family Medicine

## 2016-10-23 DIAGNOSIS — Z1231 Encounter for screening mammogram for malignant neoplasm of breast: Secondary | ICD-10-CM

## 2017-02-10 DIAGNOSIS — A084 Viral intestinal infection, unspecified: Secondary | ICD-10-CM | POA: Diagnosis not present

## 2017-02-10 DIAGNOSIS — R110 Nausea: Secondary | ICD-10-CM | POA: Diagnosis not present

## 2017-04-01 ENCOUNTER — Encounter: Payer: Self-pay | Admitting: Adult Health

## 2017-04-01 ENCOUNTER — Ambulatory Visit (INDEPENDENT_AMBULATORY_CARE_PROVIDER_SITE_OTHER): Payer: 59 | Admitting: Adult Health

## 2017-04-01 VITALS — BP 102/68 | HR 62 | Ht 64.0 in | Wt 180.0 lb

## 2017-04-01 DIAGNOSIS — R51 Headache: Secondary | ICD-10-CM | POA: Diagnosis not present

## 2017-04-01 DIAGNOSIS — G43719 Chronic migraine without aura, intractable, without status migrainosus: Secondary | ICD-10-CM | POA: Diagnosis not present

## 2017-04-01 DIAGNOSIS — R519 Headache, unspecified: Secondary | ICD-10-CM

## 2017-04-01 NOTE — Progress Notes (Signed)
PATIENT: JOHNELLE TAFOLLA DOB: Nov 12, 1970  REASON FOR VISIT: follow up- chronic daily headache HISTORY FROM: patient  HISTORY OF PRESENT ILLNESS: Today 04/01/17 Ms. Gingerich is a 46 year old female with a history of chronic daily headaches. She returns today for follow-up. She states that she continues to have a daily headache located in the temporal region bilaterally. She states that she also has developed sharp shooting pain in the right side of the head. She states these will occur for only seconds and then resolved. She reports that it does occur daily. She reports that with her headaches she does have photophobia but denies nausea and vomiting. She also has some neck discomfort. She was started on Topamax but developed paresthesias. She was then switched to [place]. However she did not try this medication. She states that she does have some residual numbness on the right side of the face after having 2 episodes of Bell's palsy in the past. She returns today for an evaluation.   HISTORY 07/29/16: Ms. Becht is a 46 year old right-handed white female with a history of chronic daily headaches. The patient has been having headaches off and on since she was in her 13s, but the headaches have gone from occurring 3 or 4 times a week to daily over the last 2-3 months. The headaches are bifrontal and bitemporal in nature and are associated with a throbbing aching pain that is better in the morning, worse as the day goes on. The patient may have some photophobia and phonophobia with the headache, but no nausea or vomiting. She may have some difficulty with concentration. She will lie down with the headache, and take Advil which seems to help within about 45 minutes. The patient may miss some work because of the headache, the patient works part-time. The patient does have a family history of migraine in the mother and in the daughter. She indicates that she is sleeping well. She has a history of  trigeminal neuralgia on the right V3 distribution, this has responded in the past to gabapentin, she may have an occasional twinge of pain, but this is not a daily issue for her. The patient denies any neck stiffness, or allergy symptoms. She reports no numbness or weakness of the face, arms, or legs. She denies any issues with balance or difficulty controlling the bowels or the bladder. She is sent to this office for an evaluation.   REVIEW OF SYSTEMS: Out of a complete 14 system review of symptoms, the patient complains only of the following symptoms, and all other reviewed systems are negative.  See history of present illness  ALLERGIES: Allergies  Allergen Reactions  . Topamax [Topiramate]     Tingling    HOME MEDICATIONS: Outpatient Medications Prior to Visit  Medication Sig Dispense Refill  . clonazePAM (KLONOPIN) 0.5 MG tablet Take 0.5 mg by mouth daily as needed for anxiety.    Marland Kitchen ibuprofen (ADVIL,MOTRIN) 200 MG tablet Take 400-600 mg by mouth every 6 (six) hours as needed for moderate pain (depends on pain if patient takes 400mg  or 600mg ).    . loratadine (CLARITIN) 10 MG tablet Take 10 mg by mouth daily.    . Multiple Vitamin (MULTIVITAMIN WITH MINERALS) TABS tablet Take 1 tablet by mouth daily.    . Probiotic Product (PROBIOTIC PO) Take 1 tablet by mouth daily.    Marland Kitchen PARoxetine (PAXIL) 10 MG tablet Take 10 mg by mouth daily.    Marland Kitchen zonisamide (ZONEGRAN) 25 MG capsule One capsule twice daily  for one week, then take 2 capsules twice daily 120 capsule 2   No facility-administered medications prior to visit.     PAST MEDICAL HISTORY: Past Medical History:  Diagnosis Date  . Anxiety   . Asthma    with pregnancy only   . Bell's palsy   . Common migraine with intractable migraine 07/29/2016  . Fibromyalgia   . Neuromuscular disorder ([place])    trigeminal nuralga     PAST SURGICAL HISTORY: Past Surgical History:  Procedure Laterality Date  . ABDOMINAL HYSTERECTOMY    .  ABLATION    . ceserean section x 3    . CYSTOSCOPY N/A 06/12/2015   Procedure: CYSTOSCOPY;  Surgeon: Aloha Gell, MD;  Location: [facility];  Service: Gynecology;  Laterality: N/A;  . DILATION AND CURETTAGE OF UTERUS    . ROBOTIC ASSISTED LAPAROSCOPIC LYSIS OF ADHESION  06/12/2015   Procedure: ROBOTIC ASSISTED LAPAROSCOPIC LYSIS OF ADHESION;  Surgeon: Aloha Gell, MD;  Location: [facility];  Service: Gynecology;;    FAMILY HISTORY: Family History  Problem Relation Age of Onset  . High blood pressure Mother   . Migraines Mother   . High Cholesterol Father   . Aortic stenosis Father   . Arrhythmia Father        Pacemaker  . Breast cancer Maternal Grandmother   . Breast cancer Maternal Aunt   . Lung cancer Maternal Aunt   . Mental retardation Daughter     SOCIAL HISTORY: Social History   Social History  . Marital status: Married    Spouse name: N/A  . Number of children: 4  . Years of education: Master's   Occupational History  . [employer]    Social History Main Topics  . Smoking status: Current Some Day Smoker    Types: Cigarettes  . Smokeless tobacco: Never Used  . Alcohol use Yes     Comment: 1-2 glasses per week  . Drug use: No  . Sexual activity: Yes    Birth control/ protection: Surgical   Other Topics Concern  . Not on file   Social History Narrative   Lives at home w/ her husband and children   Right-handed   Caffeine: 1-2 cups per day      PHYSICAL EXAM  Vitals:   04/01/17 0743  BP: 102/68  Pulse: 62  Weight: 180 lb (81.6 kg)  Height: 5\' 4"  (1.626 m)   Body mass index is 30.9 kg/m.  Generalized: Well developed, in no acute distress   Neurological examination  Mentation: Alert oriented to time, place, history taking. Follows all commands speech and language fluent Cranial nerve II-XII: Pupils were equal round reactive to light. Extraocular movements were full, visual field were full on confrontational test. Facial  sensation and strength were normal. Uvula tongue midline. Head turning and shoulder shrug  were normal and symmetric. Motor: The motor testing reveals 5 over 5 strength of all 4 extremities. Good symmetric motor tone is noted throughout.  Sensory: Sensory testing is intact to soft touch on all 4 extremities. No evidence of extinction is noted.  Coordination: Cerebellar testing reveals good finger-nose-finger and heel-to-shin bilaterally.  Gait and station: Gait is normal. Tandem gait is normal. Romberg is negative. No drift is seen.  Reflexes: Deep tendon reflexes are symmetric and normal bilaterally.   DIAGNOSTIC DATA (LABS, IMAGING, TESTING) - I reviewed patient records, labs, notes, testing and imaging myself where available.  Lab Results  Component Value Date   WBC  7.8 12/27/2015   HGB 13.3 12/27/2015   HCT 39.3 12/27/2015   MCV 87.7 12/27/2015   PLT 210 12/27/2015      Component Value Date/Time   NA 138 12/27/2015 1430   K 4.1 12/27/2015 1430   CL 107 12/27/2015 1430   CO2 27 12/27/2015 1430   GLUCOSE 96 12/27/2015 1430   BUN 10 12/27/2015 1430   CREATININE 0.79 12/27/2015 1430   CALCIUM 8.6 (L) 12/27/2015 1430   GFRNONAA >60 12/27/2015 1430   GFRAA >60 12/27/2015 1430      ASSESSMENT AND PLAN 46 y.o. year old female  has a past medical history of Anxiety; Asthma; Bell's palsy; Common migraine with intractable migraine (07/29/2016); Fibromyalgia; and Neuromuscular disorder ([place]). here with:  1. Chronic daily headache 2. Intractable migraine  Patient will try Zonegran. Because her headache quality has changed we will send her for an MRI of the brain with and without contrast. Patient is amenable to this plan. If her symptoms worsen or she develops new symptoms she she'll let us know. She will follow-up in 6 months or sooner if needed.    Ward Givens, MSN, NP-C 04/01/2017, 7:59 AM [facility] [address] [place], [place] [address] [phone]

## 2017-04-01 NOTE — Patient Instructions (Signed)
Start Zonegran MRI brain  If your symptoms worsen or you develop new symptoms please let us know.

## 2017-04-01 NOTE — Progress Notes (Signed)
I have read the note, and I agree with the clinical assessment and plan.  Shari Booth,Shari Booth   

## 2017-04-09 ENCOUNTER — Ambulatory Visit (INDEPENDENT_AMBULATORY_CARE_PROVIDER_SITE_OTHER): Payer: 59

## 2017-04-09 DIAGNOSIS — G43719 Chronic migraine without aura, intractable, without status migrainosus: Secondary | ICD-10-CM

## 2017-04-09 DIAGNOSIS — R519 Headache, unspecified: Secondary | ICD-10-CM

## 2017-04-09 DIAGNOSIS — R51 Headache: Secondary | ICD-10-CM

## 2017-04-09 MED ORDER — GADOPENTETATE DIMEGLUMINE 469.01 MG/ML IV SOLN: 15.0000 mL | Freq: Once | INTRAVENOUS | Status: DC | PRN

## 2017-04-14 ENCOUNTER — Telehealth: Payer: Self-pay | Admitting: *Deleted

## 2017-04-14 NOTE — Telephone Encounter (Signed)
Advised patient of normal MRI per previous message. Patient would like to know what the next step will be.

## 2017-04-14 NOTE — Telephone Encounter (Signed)
LMVM for pt on mobile that MRI result normal study.  She is to call back if questions.

## 2017-04-14 NOTE — Telephone Encounter (Signed)
Yes she can try Zonegran

## 2017-04-14 NOTE — Telephone Encounter (Signed)
-----   Message from Ward Givens, NP sent at 04/14/2017 11:11 AM EDT ----- MRI is normal. Please call patient.

## 2017-04-15 MED ORDER — PREDNISONE 5 MG PO TABS: ORAL_TABLET | ORAL | 0 refills | Status: DC

## 2017-04-15 NOTE — Addendum Note (Signed)
Addended by: Trudie Buckler on: 04/15/2017 05:07 PM   Modules accepted: Orders

## 2017-04-15 NOTE — Telephone Encounter (Signed)
I called the patient. She continues to have a daily headache. Within the last week she's worked up to Zonegran 50 mg twice a day. She reports that she keeps a generalized headache. She reports that her headache typically is on the right side in the temporal and parietal region. She reports photophobia and phonophobia. At this point I do not think we need to do an MRI of the cervical spine. She should continue on Zonegran 50 mg twice a day. I will give her a prednisone Dosepak to hopefully break the cycle of her headache. If her symptoms worsen or she develops new symptoms she will give [place] a call.

## 2017-04-15 NOTE — Telephone Encounter (Signed)
Called pt and relayed that zonegran was the next thing to try and she has been on this since seen last 04-01-17 and she has not noted difference.  She states that she asking about getting an MRI cervical spine, which her neck might be  (cause of her headaches). I told her that would be glad to ask.

## 2017-04-16 NOTE — Telephone Encounter (Signed)
Pt called the clinic she has been itching all over for the past 3 days. She did not realize it could be the medication until this morning. Please call to discuss

## 2017-04-16 NOTE — Telephone Encounter (Signed)
Spoke to pt and she is having increased generalized itching for the last 3 days, no rash. No change in laundry detergents. She has been on a new antidepressant (trintellix) for the last month.  She feels like the itching is related to the increased dose of zonegran.  I relayed that per MM/NP since she has been on this for since 04-01-17 with no issues, does not feel like it is related to zonegran.  Pt is having the pharmacist check to see if any drug interactions.  She does not want to continue to take this if causing itching.  I instructed that the prednisone will help (since anti-inflammatory) , will cause increased appetite, energy and insomnia.  I relayed that she can decrease dose of zonegran to lower dose and see if improvement if not can go off completely and see if improvement.  If improvement, she may start again , as a  test , and see if causes same reaction.  She will let us know how she does.

## 2017-04-16 NOTE — Telephone Encounter (Signed)
Called pt and LMVM for her that received message about itching for the last 3 days.

## 2017-04-16 NOTE — Telephone Encounter (Signed)
Patient called office returning RN's call.  Please call °

## 2017-04-16 NOTE — Telephone Encounter (Signed)
noted 

## 2017-04-21 ENCOUNTER — Encounter: Payer: Self-pay | Admitting: Adult Health

## 2017-04-21 MED ORDER — GABAPENTIN 100 MG PO CAPS: ORAL_CAPSULE | ORAL | 5 refills | Status: DC

## 2017-04-21 NOTE — Telephone Encounter (Signed)
I called the patient. She reports that her itching has subsided since she stopped Zonegran. She continues to have a daily headache. We will try gabapentin 100 mg at bedtime for 1 week then increasing to 200 mg the second week and then 300 mg thereafter. She has been on gabapentin for and tolerating it well. However that time she was taking it for trigeminal neuralgia. She is advised of the side effects that are associated with gabapentin.

## 2017-04-23 ENCOUNTER — Telehealth: Payer: Self-pay | Admitting: Adult Health

## 2017-04-23 NOTE — Telephone Encounter (Signed)
I called her back and relayed to take the gabapetin as she suggested.  She states she can't wait until this builds in her system.  She is asking if there is something else?  She has oxycontin that she has from some surgery in the past, I told her that I cannot tell her to take this, as we did not prescribe.

## 2017-04-23 NOTE — Telephone Encounter (Signed)
Spoke to pt after consulting with MM/NP about pt and her request.  Ok to come in and try depacon infusion 500mg  IV and repeat x 1.  Start taking gapentin tonight.  Pt verbalized understanding and will be here tomorrow at 0900 for infusion with Otila Kluver in [facility].

## 2017-04-23 NOTE — Telephone Encounter (Signed)
Patient should try gabapentin as I suggested. If gabapentin is not helpful then we can try infusion

## 2017-04-23 NOTE — Telephone Encounter (Signed)
Pt called she's had a HA for the past weekend with no relief. She said Jinny Blossom has talked about an infusion. She has not tried Neurontin yet, she jsut picked it up today. She is wanting something to take effect right away the HA is interfering with her job. Please call

## 2017-04-23 NOTE — Telephone Encounter (Signed)
Spoke to pt and she has had since weekend migraine headache that won't go way.  Level 7-8.  Does not have much nausea.  Has photo and phono sensitivity.  She stated that steroids did not work the last time she got them.  She stated you spoke of an infusion.  She just picked up her gabapentin and has not started this.  Would like to try infusion.

## 2017-04-24 ENCOUNTER — Telehealth: Payer: Self-pay

## 2017-04-24 ENCOUNTER — Encounter: Payer: Self-pay | Admitting: Adult Health

## 2017-04-24 MED ORDER — BUTALBITAL-APAP-CAFFEINE 50-325-40 MG PO TABS: 1.0000 | ORAL_TABLET | Freq: Four times a day (QID) | ORAL | 0 refills | Status: DC | PRN

## 2017-04-24 NOTE — Telephone Encounter (Signed)
I called the patient. She reports that she is back in discomfort. She states she feels as if her trigeminal neuralgia is returning. I spoke to Dr. Lavell Anchors. She will come in tomorrow at 12:00 for trigger point injections with Dr. Jaynee Eagles. I will also call in Fioricet for the patient. She states she will only use this if the trigger point injection does not offer her any benefit. I reviewed side effects of Fioricet with patient. She is advised that if her symptoms worsen she should let us know. Of course if she has a severe headache she should go to the  emergency room

## 2017-04-24 NOTE — Telephone Encounter (Signed)
Rx of floricet fax to [facility] at [phone]. Fax confirmed.It was fax twice and confirmed.

## 2017-04-24 NOTE — Telephone Encounter (Signed)
Pt calling back re: the infusion that she had earlier, she said it did help her to sleep but that she is having pain .  Its on her left side of neck and face.  Pt asking for a call back as to what to do at this point

## 2017-04-25 ENCOUNTER — Encounter: Payer: Self-pay | Admitting: Neurology

## 2017-04-25 ENCOUNTER — Ambulatory Visit (INDEPENDENT_AMBULATORY_CARE_PROVIDER_SITE_OTHER): Payer: 59 | Admitting: Neurology

## 2017-04-25 VITALS — BP 115/69 | HR 61 | Ht 64.0 in | Wt 176.6 lb

## 2017-04-25 DIAGNOSIS — M542 Cervicalgia: Secondary | ICD-10-CM | POA: Diagnosis not present

## 2017-04-25 DIAGNOSIS — R519 Headache, unspecified: Secondary | ICD-10-CM

## 2017-04-25 DIAGNOSIS — R51 Headache: Secondary | ICD-10-CM | POA: Diagnosis not present

## 2017-04-25 DIAGNOSIS — G8929 Other chronic pain: Secondary | ICD-10-CM

## 2017-04-29 ENCOUNTER — Encounter: Payer: Self-pay | Admitting: Neurology

## 2017-04-29 ENCOUNTER — Other Ambulatory Visit: Payer: Self-pay | Admitting: Adult Health

## 2017-04-29 DIAGNOSIS — M542 Cervicalgia: Secondary | ICD-10-CM | POA: Diagnosis not present

## 2017-04-29 DIAGNOSIS — R51 Headache: Secondary | ICD-10-CM | POA: Diagnosis not present

## 2017-04-29 DIAGNOSIS — R6884 Jaw pain: Secondary | ICD-10-CM | POA: Diagnosis not present

## 2017-04-29 NOTE — Progress Notes (Signed)
Performed by Dr. Jaynee Eagles M.D. Lidocaine 1%,Marcaine 0.5% was used. All procedures a documented below were medically necessary, reasonable and appropriate based on the patient's history, medical diagnosis and physician opinion. Verbal informed consent was obtained from the patient, patient was informed of potential risk of procedure, including bruising, bleeding, hematoma formation, infection, muscle weakness, muscle pain, numbness, transient hypertension, transient hyperglycemia and transient insomnia among others. All areas injected were topically clean with isopropyl rubbing alcohol. Nonsterile nonlatex gloves were worn during the procedure.  1. Auriculotemporal nerve block (78478): The Auriculotemporal nerve site was identified along the posterior margin of the sternocleidomastoid muscle toward the base of the ear. Medication was injected into the left and right radicular temporal nerve areas. Patient's condition is associated with inflammation of the Auriculotemporal Nerve and associated muscle groups. Injection was deemed medically necessary, reasonable and appropriate. Injection represents a separate and unique surgical service.  2. Supraorbital nerve block (64400): Supraorbital nerve site was identified along the incision of the frontal bone on the orbital/supraorbital ridge. Medication was injected into the left and right supraorbital nerve areas. Patient's condition is associated with inflammation of the supraorbital and associated muscle groups. Injection was deemed medically necessary, reasonable and appropriate. Injection represents a separate and unique surgical service.  5. Trigger point injections (41282): Bilateral trapezius, semispinalis capitis, levator scapula, paraspinals.

## 2017-04-30 ENCOUNTER — Encounter: Payer: Self-pay | Admitting: Adult Health

## 2017-04-30 ENCOUNTER — Encounter: Payer: Self-pay | Admitting: Neurology

## 2017-04-30 NOTE — Telephone Encounter (Signed)
I will not refill fioricet as it can cause rebound headaches. It was just prescribed a week ago. Not sure that a rescue medication is going to be helpful if she is having a continuous headache? Maybe try increasing gabapentin first. I will include Dr. Jaynee Eagles on the message for her input as well.

## 2017-05-01 ENCOUNTER — Telehealth: Payer: Self-pay | Admitting: *Deleted

## 2017-05-01 MED ORDER — SUMATRIPTAN SUCCINATE 100 MG PO TABS: 100.0000 mg | ORAL_TABLET | Freq: Once | ORAL | 2 refills | Status: DC | PRN

## 2017-05-01 NOTE — Telephone Encounter (Signed)
Shari Booth, at this point if she is still having migraines after having infusions, nerve blocks, prednisone packs then she needs to be admitted to the hospital for DHE protocol. Shari Booth can you call patient and get an update on her condition? We could try another infusion.

## 2017-05-01 NOTE — Telephone Encounter (Signed)
Spoke to pt relating to appointment per Dr. Jaynee Eagles request and placed appt for 05-05-17 at 1630.  Pt to arrive 1615.  Pt also agreeable for imitrex prescription.  Prescription placed for [facility] in [facility] for imitrex 100mg  tabs Take one onset of migraine prn and may repeat in 2 hours if partial relief.  # 10 with 2 refills. (max of 2 tabs in 24 hours).

## 2017-05-02 NOTE — Telephone Encounter (Signed)
Fax confirmation received imitrex [facility] [phone].  Appt made for pt on 05-05-17 at 1630. Pt confirmed.

## 2017-05-02 NOTE — Telephone Encounter (Signed)
Spoke to pt yesterday 05-01-17 and made appt for Monday 05-05-17 at 1630.  Medication, imitrex prescription was faxed to [facility] yesterday for pt.  She was doing better, but still had headache.

## 2017-05-05 ENCOUNTER — Ambulatory Visit (INDEPENDENT_AMBULATORY_CARE_PROVIDER_SITE_OTHER): Payer: 59 | Admitting: Neurology

## 2017-05-05 ENCOUNTER — Encounter: Payer: Self-pay | Admitting: Neurology

## 2017-05-05 ENCOUNTER — Telehealth: Payer: Self-pay | Admitting: Neurology

## 2017-05-05 VITALS — BP 105/67 | HR 59 | Ht 64.5 in | Wt 180.0 lb

## 2017-05-05 DIAGNOSIS — G43711 Chronic migraine without aura, intractable, with status migrainosus: Secondary | ICD-10-CM | POA: Diagnosis not present

## 2017-05-05 MED ORDER — GABAPENTIN (ONCE-DAILY) 300 & 600 MG PO MISC: 600.0000 mg | Freq: Every day | ORAL | 11 refills | Status: DC

## 2017-05-05 MED ORDER — ELETRIPTAN HYDROBROMIDE 40 MG PO TABS: 40.0000 mg | ORAL_TABLET | ORAL | 11 refills | Status: DC | PRN

## 2017-05-05 NOTE — Progress Notes (Signed)
[facility]    Provider:  Dr Jaynee Eagles Referring Provider: Kathyrn Lass, MD Primary Care Physician:  Kathyrn Lass, MD  CC:  Migraines  HPI:  Shari Booth is a 46 y.o. female here as a referral from Dr. Sabra Heck for chronic daily headache. Headaches daily since she was in her 20s headaches are bifrontal and bitemporal in nature and associated with a throbbing aching pain that is better in the morning, worse as the day goes on. Endorses photophobia and phonophobia but no nausea or vomiting. Advil appears to help. She also has a past medical history of trigeminal neuralgia on the right V3 distribution and has responded in the past to gabapentin. Started on Topamax by Dr. Jannifer Franklin. Patient could not tolerate the side effects of Topamax. Zonegran was started. Marland Kitchen MRI of the brain was normal. She has daily headaches. At least half of them are migrainous (15-20 in a month). No medication overuse. No aura. Migraines and headaches can last up to 24 hours and have frequently lasted will days in a row. Has had daily frequency for over a year.Headaches can last up to 24 hours.Worsening of migraines. One cup of coffee daily, no other caffeine, no artificial sweeteners, she has examined her diet, stays well-hydrated, sleeps well.  Medications tried: Topamax (did not tolerate due to paresthesias), Zonegran to 50 mg twice daily but developed itching on increased dose, prednisone dosepak, gabapentin, Imitrex(had reaction and pain), lexapro, propranolol, paxil  Reviewed notes, labs and imaging from outside physicians, which showed:  CBC and BMP unremarkable May 2017  Personally reviewed MRI of the brain images which were normal  Personally reviewed notes from [facility], this is documented in the history of present illness.   Review of Systems: Patient complains of symptoms per HPI as well as the following symptoms: no CP, no SOB. Pertinent negatives and positives per HPI.  All others negative.   Social History   Social History  . Marital status: Married    Spouse name: N/A  . Number of children: 4  . Years of education: Master's   Occupational History  . [employer]    Social History Main Topics  . Smoking status: Current Some Day Smoker    Types: Cigarettes  . Smokeless tobacco: Never Used  . Alcohol use Yes     Comment: 1-2 glasses per week  . Drug use: No  . Sexual activity: Yes    Birth control/ protection: Surgical   Other Topics Concern  . Not on file   Social History Narrative   Lives at home w/ her husband and children   Right-handed   Caffeine: 1-2 cups per day    Family History  Problem Relation Age of Onset  . High blood pressure Mother   . Migraines Mother   . High Cholesterol Father   . Aortic stenosis Father   . Arrhythmia Father        Pacemaker  . Breast cancer Maternal Grandmother   . Breast cancer Maternal Aunt   . Lung cancer Maternal Aunt   . Mental retardation Daughter     Past Medical History:  Diagnosis Date  . Anxiety   . Asthma    with pregnancy only   . Bell's palsy   . Common migraine with intractable migraine 07/29/2016  . Fibromyalgia   . Neuromuscular disorder ([place])    trigeminal nuralga     Past Surgical History:  Procedure Laterality Date  . ABDOMINAL HYSTERECTOMY    .  ABLATION    . ceserean section x 3    . CYSTOSCOPY N/A 06/12/2015   Procedure: CYSTOSCOPY;  Surgeon: Aloha Gell, MD;  Location: [facility];  Service: Gynecology;  Laterality: N/A;  . DILATION AND CURETTAGE OF UTERUS    . ROBOTIC ASSISTED LAPAROSCOPIC LYSIS OF ADHESION  06/12/2015   Procedure: ROBOTIC ASSISTED LAPAROSCOPIC LYSIS OF ADHESION;  Surgeon: Aloha Gell, MD;  Location: [facility];  Service: Gynecology;;    Current Outpatient Prescriptions  Medication Sig Dispense Refill  . clonazePAM (KLONOPIN) 0.5 MG tablet Take 0.5 mg by mouth daily as needed for anxiety.    Marland Kitchen ibuprofen (ADVIL,MOTRIN)  200 MG tablet Take 400-600 mg by mouth every 6 (six) hours as needed for moderate pain (depends on pain if patient takes 400mg  or 600mg ).    . loratadine (CLARITIN) 10 MG tablet Take 10 mg by mouth daily.    . Multiple Vitamin (MULTIVITAMIN WITH MINERALS) TABS tablet Take 1 tablet by mouth daily.    . Probiotic Product (PROBIOTIC PO) Take 1 tablet by mouth daily.    . TRINTELLIX 10 MG TABS Take 5 mg by mouth daily.     Marland Kitchen eletriptan (RELPAX) 40 MG tablet Take 1 tablet (40 mg total) by mouth as needed for migraine or headache. May repeat in 2 hours if headache persists or recurs. 10 tablet 11  . Gabapentin, Once-Daily, (GRALISE STARTER) 300 & 600 MG MISC Take 600 mg by mouth at bedtime. 30 each 11   No current facility-administered medications for this visit.    Facility-Administered Medications Ordered in Other Visits  Medication Dose Route Frequency Provider Last Rate Last Dose  . gadopentetate dimeglumine (MAGNEVIST) injection 15 mL  15 mL Intravenous Once PRN Ward Givens, NP        Allergies as of 05/05/2017 - Review Complete 05/05/2017  Allergen Reaction Noted  . Topamax [topiramate]  08/29/2016  . Zonegran [zonisamide]  04/23/2017    Vitals: BP 105/67   Pulse (!) 59   Ht 5' 4.5" (1.638 m)   Wt 180 lb (81.6 kg)   BMI 30.42 kg/m  Last Weight:  Wt Readings from Last 1 Encounters:  05/05/17 180 lb (81.6 kg)   Last Height:   Ht Readings from Last 1 Encounters:  05/05/17 5' 4.5" (1.638 m)   Physical exam: Exam: Gen: NAD, conversant, well nourised, obese, well groomed                     CV: RRR, no MRG. No Carotid Bruits. No peripheral edema, warm, nontender Eyes: Conjunctivae clear without exudates or hemorrhage  Neuro: Detailed Neurologic Exam  Speech:    Speech is normal; fluent and spontaneous with normal comprehension.  Cognition:    The patient is oriented to person, place, and time;     recent and remote memory intact;     language fluent;     normal  attention, concentration,     fund of knowledge Cranial Nerves:    The pupils are equal, round, and reactive to light. The fundi are normal and spontaneous venous pulsations are present. Visual fields are full to finger confrontation. Extraocular movements are intact. Trigeminal sensation is intact and the muscles of mastication are normal. The face is symmetric. The palate elevates in the midline. Hearing intact. Voice is normal. Shoulder shrug is normal. The tongue has normal motion without fasciculations.   Coordination:    Normal finger to nose and heel to shin. Normal rapid alternating movements.  Gait:    Heel-toe and tandem gait are normal.   Motor Observation:    No asymmetry, no atrophy, and no involuntary movements noted. Tone:    Normal muscle tone.    Posture:    Posture is normal. normal erect    Strength:    Strength is V/V in the upper and lower limbs.      Sensation: intact to LT     Reflex Exam:  DTR's:    Deep tendon reflexes in the upper and lower extremities are normal bilaterally.   Toes:    The toes are downgoing bilaterally.   Clonus:    Clonus is absent.    Assessment/Plan:  46 year old patient with chronic migraines without aura intractable with status migrainosus who has failed multiple preventative medications, no medication overuse, daily headaches with at least 20 a month migrainous for over a year. Had a long discussion about migraine management including acute and chronic management. At this point I do feel that she would be a good candidate for Botox having failed multiple medications and medication classes. Also discussed with new CGRP medications for migraine preventative. At this point we'll increase her gabapentin in an extended release formulation start with 300 mg and increase to 600 mg at bedtime. Also discussed possibly starting candesartan which has recently shown strong evidence as migraine preventative however her blood pressure is on the  lower side, we'll need to watch that. Carefully, discussed side effects such as lightheadedness, dizziness, chest pain and risks for bradycardia and arrhythmias.  Preventatives: Botox for migraines: will request Aimovig: discussed, something to consider Gralise instead of gabapentin, stop generic gabapentin and start extended release Gralisein the evenings before bed Candesartan 4mg  We'll check a TSH as hyperthyroidism can cause chronic headaches  Acute Management: Not using acute medications more than 10 times a month, patient denies any medication overuse We'll try Relpax acutely  Discussed: To prevent or relieve headaches, try the following: Cool Compress. Lie down and place a cool compress on your head.  Avoid headache triggers. If certain foods or odors seem to have triggered your migraines in the past, avoid them. A headache diary might help you identify triggers.  Include physical activity in your daily routine. Try a daily walk or other moderate aerobic exercise.  Manage stress. Find healthy ways to cope with the stressors, such as delegating tasks on your to-do list.  Practice relaxation techniques. Try deep breathing, yoga, massage and visualization.  Eat regularly. Eating regularly scheduled meals and maintaining a healthy diet might help prevent headaches. Also, drink plenty of fluids.  Follow a regular sleep schedule. Sleep deprivation might contribute to headaches Consider biofeedback. With this mind-body technique, you learn to control certain bodily functions - such as muscle tension, heart rate and blood pressure - to prevent headaches or reduce headache pain.    Proceed to emergency room if you experience new or worsening symptoms or symptoms do not resolve, if you have new neurologic symptoms or if headache is severe, or for any concerning symptom.   Provided education and documentation from American headache Society toolbox including articles on: chronic migraine  medication overuse headache, chronic migraines, prevention of migraines, behavioral and other nonpharmacologic treatments for headache.    Sarina Ill, MD  [facility] [address] [place], [place] [address]  Phone [phone] Fax [phone]  A total of 40  minutes was spent face-to-face with this patient. Over half this time was spent on counseling patient on the chronic  migraine diagnosis and different diagnostic and therapeutic options available.

## 2017-05-05 NOTE — Patient Instructions (Signed)
Candesartan tablets What is this medicine? CANDESARTAN (kan des AR tan) is used to treat high blood pressure in children and adults. This drug is also used to treat adults with heart failure. This medicine may be used for other purposes; ask your health care provider or pharmacist if you have questions. COMMON BRAND NAME(S): Atacand What should I tell my health care provider before I take this medicine? They need to know if you have any of these conditions: -heart failure -kidney or liver disease -if you are on a special diet, such as a low salt diet -an unusual or allergic reaction to candesartan, other medicines, foods, dyes, or preservatives -pregnant or trying to get pregnant -breast-feeding How should I use this medicine? Take this medicine by mouth with a glass of water. Follow the directions on the prescription label. This medicine can be taken with or without food. Take your doses at regular intervals. Do not take your medicine more often than directed. Talk to your pediatrician regarding the use of this medicine in children. While this drug may be prescribed for children as young as 1 year for selected conditions, precautions do apply. For children who are unable to swallow tablets, the pharmacist can prepare the medicine in an oral suspension. Overdosage: If you think you have taken too much of this medicine contact a poison control center or emergency room at once. NOTE: This medicine is only for you. Do not share this medicine with others. What if I miss a dose? If you miss a dose, take it as soon as you can. If it is almost time for your next dose, take only that dose. Do not take double or extra doses. What may interact with this medicine? -blood pressure medicines -diuretics, especially triamterene, spironolactone, or amiloride -lithium -NSAIDs, medicines for pain and inflammation, like ibuprofen or naproxen -potassium salts or potassium supplements This list may not  describe all possible interactions. Give your health care provider a list of all the medicines, herbs, non-prescription drugs, or dietary supplements you use. Also tell them if you smoke, drink alcohol, or use illegal drugs. Some items may interact with your medicine. What should I watch for while using this medicine? Visit your doctor or health care professional for regular checks on your progress. Check your blood pressure as directed. Ask your doctor or health care professional what your blood pressure should be and when you should contact him or her. Call your doctor or health care professional if you notice an irregular or fast heart beat. Women should inform their doctor if they wish to become pregnant or think they might be pregnant. There is a potential for serious side effects to an unborn child, particularly in the second or third trimester. Talk to your health care professional or pharmacist for more information. You may get drowsy or dizzy. Do not drive, use machinery, or do anything that needs mental alertness until you know how this drug affects you. Do not stand or sit up quickly, especially if you are an older patient. This reduces the risk of dizzy or fainting spells. Avoid salt substitutes unless you are told otherwise by your doctor or health care professional. Do not treat yourself for coughs, colds, or pain while you are taking this medicine without asking your doctor or health care professional for advice. Some ingredients may increase your blood pressure. What side effects may I notice from receiving this medicine? Side effects that you should report to your doctor or health care professional  as soon as possible: -allergic reactions like skin rash, itching or hives, swelling of the face, lips, or tongue -breathing problems -chest pain -decreased amount of urine passed -fast or irregular heart beat -feeling faint or lightheaded, falls -swelling of your hands or feet Side effects  that usually do not require medical attention (report to your doctor or health care professional if they continue or are bothersome): -change in sex drive or performance -cough -headache -nausea or stomach pain This list may not describe all possible side effects. Call your doctor for medical advice about side effects. You may report side effects to FDA at 1-800-FDA-1088. Where should I keep my medicine? Keep out of the reach of children. Store at room temperature between 15 and 30 degrees C (59 and 86 degrees F). Protect from light. Keep container tightly closed. Throw away any unused medicine after the expiration date. NOTE: This sheet is a summary. It may not cover all possible information. If you have questions about this medicine, talk to your doctor, pharmacist, or health care provider.  2018 Elsevier/Gold Standard (2008-06-21 10:07:40)    Gabapentin extended-release tablets (Gralise) What is this medicine? GABAPENTIN (GA ba pen tin) is used to treat certain types of nerve pain. This medicine may be used for other purposes; ask your health care provider or pharmacist if you have questions. COMMON BRAND NAME(S): Gralise What should I tell my health care provider before I take this medicine? They need to know if you have any of these conditions: -kidney disease -seizures -suicidal thoughts, plans, or attempt; a previous suicide attempt by you or a family member -an unusual or allergic reaction to gabapentin, other medicines, foods, dyes, or preservatives -pregnant or trying to get pregnant -breast-feeding How should I use this medicine? Take this medicine by mouth with a glass of water. Follow the directions on the prescription label. Take this medicine with your evening meal. Do not cut, crush, or chew this medicine. Take your medicine at regular intervals. Do not take it more often than directed. Do not stop taking except on your doctor's advice. A special MedGuide will be given  to you by the pharmacist with each prescription and refill. Be sure to read this information carefully each time. Talk to your pediatrician regarding the use of this medicine in children. Special care may be needed. Overdosage: If you think you have taken too much of this medicine contact a poison control center or emergency room at once. NOTE: This medicine is only for you. Do not share this medicine with others. What if I miss a dose? If you miss a dose, take it as soon as you can. If it is almost time for your next dose, take only that dose. Do not take double or extra doses. What may interact with this medicine? Do not take this medicine with any of the following medications: -other gabapentin products (Horizant, Neurontin) This medicine may also interact with the following medications: -alcohol -antacids -antihistamines for allergy, cough and cold -certain medicines for anxiety or sleep -certain medicines for depression or psychotic disturbances -homatropine; hydrocodone -naproxen -narcotic medicines (opiates) for pain -phenothiazines like chlorpromazine, mesoridazine, prochlorperazine, thioridazine This list may not describe all possible interactions. Give your health care provider a list of all the medicines, herbs, non-prescription drugs, or dietary supplements you use. Also tell them if you smoke, drink alcohol, or use illegal drugs. Some items may interact with your medicine. What should I watch for while using this medicine? Tell your doctor or healthcare  professional if your symptoms do not start to get better or if they get worse. Do not stop taking except on your doctor's advice. You may develop a severe reaction. Your doctor will tell you how much medicine to take. You may get drowsy or dizzy. Do not drive, use machinery, or do anything that needs mental alertness until you know how this medicine affects you. Do not stand or sit up quickly, especially if you are an older patient.  This reduces the risk of dizzy or fainting spells. Alcohol may interfere with the effect of this medicine. Avoid alcoholic drinks. The use of this medicine may increase the chance of suicidal thoughts or actions. Pay special attention to how you are responding while on this medicine. Any worsening of mood, or thoughts of suicide or dying should be reported to your health care professional right away. Your mouth may get dry. Chewing sugarless gum or sucking hard candy, and drinking plenty of water may help. Contact your doctor if the problem does not go away or is severe. What side effects may I notice from receiving this medicine? Side effects that you should report to your doctor or health care professional as soon as possible: -allergic reactions like skin rash, itching or hives, swelling of the face, lips, or tongue -suicidal thoughts or other mood changes Side effects that usually do not require medical attention (report to your doctor or health care professional if they continue or are bothersome): -confusion -diarrhea -dizziness -dry mouth -loss of balance or coordination -nausea -tiredness -tremors -weight gain This list may not describe all possible side effects. Call your doctor for medical advice about side effects. You may report side effects to FDA at 1-800-FDA-1088. Where should I keep my medicine? Keep out of the reach of children. This medicine may cause accidental overdose and death if it taken by other adults, children, or pets. Mix any unused medicine with a substance like cat litter or coffee grounds. Then throw the medicine away in a sealed container like a sealed bag or a coffee can with a lid. Do not use the medicine after the expiration date. Store at room temperature between 15 and 30 degrees C (59 and 86 degrees F). NOTE: This sheet is a summary. It may not cover all possible information. If you have questions about this medicine, talk to your doctor, pharmacist, or  health care provider.  2018 Elsevier/Gold Standard (2016-01-26 10:14:51)

## 2017-05-05 NOTE — Telephone Encounter (Signed)
Danielle, I am requesting botox for migraine for patient G43.711. Would you give her a call please and discuss?  Lovey Newcomer, could you give me a botox request form please tomorrow? thanks

## 2017-05-06 NOTE — Telephone Encounter (Signed)
Shari Booth, left form on your desk not sure if you spoke with patient but I believe you did that day thanks

## 2017-05-07 NOTE — Telephone Encounter (Signed)
I called and spoke with the patient, scheduled botox injection.

## 2017-05-08 DIAGNOSIS — M542 Cervicalgia: Secondary | ICD-10-CM | POA: Diagnosis not present

## 2017-05-08 DIAGNOSIS — R51 Headache: Secondary | ICD-10-CM | POA: Diagnosis not present

## 2017-05-08 DIAGNOSIS — R6884 Jaw pain: Secondary | ICD-10-CM | POA: Diagnosis not present

## 2017-05-14 DIAGNOSIS — G43711 Chronic migraine without aura, intractable, with status migrainosus: Secondary | ICD-10-CM | POA: Diagnosis not present

## 2017-05-14 NOTE — Telephone Encounter (Signed)
Noted, thank you

## 2017-05-14 NOTE — Telephone Encounter (Signed)
Shari Booth/[facility] [phone] called said the botox shipment will go out today and will rec'd at clinic tomorrow between 8-12

## 2017-05-15 ENCOUNTER — Ambulatory Visit (INDEPENDENT_AMBULATORY_CARE_PROVIDER_SITE_OTHER): Payer: 59 | Admitting: Neurology

## 2017-05-15 ENCOUNTER — Encounter: Payer: Self-pay | Admitting: Neurology

## 2017-05-15 VITALS — BP 118/80 | HR 59

## 2017-05-15 DIAGNOSIS — G43711 Chronic migraine without aura, intractable, with status migrainosus: Secondary | ICD-10-CM

## 2017-05-15 NOTE — Progress Notes (Signed)
Botox 100units, 2 vials. Expires 11/2019.Lot number is [id number]. [id number]

## 2017-05-16 DIAGNOSIS — R6884 Jaw pain: Secondary | ICD-10-CM | POA: Diagnosis not present

## 2017-05-16 DIAGNOSIS — N644 Mastodynia: Secondary | ICD-10-CM | POA: Diagnosis not present

## 2017-05-16 DIAGNOSIS — R51 Headache: Secondary | ICD-10-CM | POA: Diagnosis not present

## 2017-05-16 DIAGNOSIS — M542 Cervicalgia: Secondary | ICD-10-CM | POA: Diagnosis not present

## 2017-05-18 NOTE — Progress Notes (Signed)

## 2017-05-19 DIAGNOSIS — R6884 Jaw pain: Secondary | ICD-10-CM | POA: Diagnosis not present

## 2017-05-19 DIAGNOSIS — M542 Cervicalgia: Secondary | ICD-10-CM | POA: Diagnosis not present

## 2017-05-19 DIAGNOSIS — R51 Headache: Secondary | ICD-10-CM | POA: Diagnosis not present

## 2017-05-22 DIAGNOSIS — R51 Headache: Secondary | ICD-10-CM | POA: Diagnosis not present

## 2017-05-22 DIAGNOSIS — M542 Cervicalgia: Secondary | ICD-10-CM | POA: Diagnosis not present

## 2017-05-22 DIAGNOSIS — R6884 Jaw pain: Secondary | ICD-10-CM | POA: Diagnosis not present

## 2017-05-28 DIAGNOSIS — R6884 Jaw pain: Secondary | ICD-10-CM | POA: Diagnosis not present

## 2017-05-28 DIAGNOSIS — R51 Headache: Secondary | ICD-10-CM | POA: Diagnosis not present

## 2017-05-28 DIAGNOSIS — M542 Cervicalgia: Secondary | ICD-10-CM | POA: Diagnosis not present

## 2017-05-29 DIAGNOSIS — R51 Headache: Secondary | ICD-10-CM | POA: Diagnosis not present

## 2017-05-29 DIAGNOSIS — R6884 Jaw pain: Secondary | ICD-10-CM | POA: Diagnosis not present

## 2017-05-29 DIAGNOSIS — M542 Cervicalgia: Secondary | ICD-10-CM | POA: Diagnosis not present

## 2017-06-02 ENCOUNTER — Telehealth: Payer: Self-pay | Admitting: Neurology

## 2017-06-02 DIAGNOSIS — R6884 Jaw pain: Secondary | ICD-10-CM | POA: Diagnosis not present

## 2017-06-02 DIAGNOSIS — M542 Cervicalgia: Secondary | ICD-10-CM | POA: Diagnosis not present

## 2017-06-02 DIAGNOSIS — R51 Headache: Secondary | ICD-10-CM | POA: Diagnosis not present

## 2017-06-02 MED ORDER — GABAPENTIN (ONCE-DAILY) 600 MG PO TABS: 600.0000 mg | ORAL_TABLET | Freq: Every day | ORAL | 2 refills | Status: DC

## 2017-06-02 NOTE — Telephone Encounter (Signed)
We usually send it through specialty pharmacy to get this approved, if she is denied they give it ot her anyway. See me tomorrow and I will show you and you can call patient about it thanks!!

## 2017-06-02 NOTE — Telephone Encounter (Signed)
Pt request new RX for Gabapentin, Once-Daily, (GRALISE STARTER) 300 & 600 MG MISC sent to [facility]. She is taking 600mg /day. Pharmacist has advised her he has known of one other patient that was able to get this medication approved thru insurance.

## 2017-06-03 ENCOUNTER — Other Ambulatory Visit: Payer: Self-pay | Admitting: *Deleted

## 2017-06-03 MED ORDER — GABAPENTIN (ONCE-DAILY) 600 MG PO TABS: 600.0000 mg | ORAL_TABLET | Freq: Every day | ORAL | 2 refills | Status: DC

## 2017-06-03 NOTE — Telephone Encounter (Signed)
Per vo by Dr. Jaynee Eagles, escribe rx to [facility].  She was informed if insurance will not cover that they can still provide rx at low to no cost.  Patient is aware to expect a call from [facility] for patient enrollment and instructions on home delivery.

## 2017-06-04 NOTE — Telephone Encounter (Signed)
Received a PA from [facility] for Gralise 600 mg tablets. We are under the assumption that [facility] will be supplying this medication. I called and LVM w/ [facility] [place] to check the status.

## 2017-06-09 DIAGNOSIS — R51 Headache: Secondary | ICD-10-CM | POA: Diagnosis not present

## 2017-06-09 DIAGNOSIS — M542 Cervicalgia: Secondary | ICD-10-CM | POA: Diagnosis not present

## 2017-06-09 DIAGNOSIS — R6884 Jaw pain: Secondary | ICD-10-CM | POA: Diagnosis not present

## 2017-06-11 ENCOUNTER — Ambulatory Visit: Payer: 59 | Admitting: Neurology

## 2017-06-19 DIAGNOSIS — R6884 Jaw pain: Secondary | ICD-10-CM | POA: Diagnosis not present

## 2017-06-19 DIAGNOSIS — R51 Headache: Secondary | ICD-10-CM | POA: Diagnosis not present

## 2017-06-19 DIAGNOSIS — M542 Cervicalgia: Secondary | ICD-10-CM | POA: Diagnosis not present

## 2017-06-25 DIAGNOSIS — Z13220 Encounter for screening for lipoid disorders: Secondary | ICD-10-CM | POA: Diagnosis not present

## 2017-06-25 DIAGNOSIS — Z23 Encounter for immunization: Secondary | ICD-10-CM | POA: Diagnosis not present

## 2017-06-25 DIAGNOSIS — Z0000 Encounter for general adult medical examination without abnormal findings: Secondary | ICD-10-CM | POA: Diagnosis not present

## 2017-06-25 DIAGNOSIS — Z131 Encounter for screening for diabetes mellitus: Secondary | ICD-10-CM | POA: Diagnosis not present

## 2017-06-25 DIAGNOSIS — Z01419 Encounter for gynecological examination (general) (routine) without abnormal findings: Secondary | ICD-10-CM | POA: Diagnosis not present

## 2017-06-27 ENCOUNTER — Other Ambulatory Visit: Payer: Self-pay | Admitting: Obstetrics

## 2017-06-27 DIAGNOSIS — R51 Headache: Secondary | ICD-10-CM | POA: Diagnosis not present

## 2017-06-27 DIAGNOSIS — N644 Mastodynia: Secondary | ICD-10-CM

## 2017-06-27 DIAGNOSIS — M542 Cervicalgia: Secondary | ICD-10-CM | POA: Diagnosis not present

## 2017-06-27 DIAGNOSIS — R6884 Jaw pain: Secondary | ICD-10-CM | POA: Diagnosis not present

## 2017-07-02 ENCOUNTER — Ambulatory Visit: Payer: 59 | Admitting: Adult Health

## 2017-07-03 ENCOUNTER — Ambulatory Visit
Admission: RE | Admit: 2017-07-03 | Discharge: 2017-07-03 | Disposition: A | Discharge status: Home / Self Care | Payer: 59 | Source: Ambulatory Visit | Attending: Obstetrics | Admitting: Obstetrics

## 2017-07-03 DIAGNOSIS — N644 Mastodynia: Secondary | ICD-10-CM | POA: Diagnosis not present

## 2017-07-03 DIAGNOSIS — R928 Other abnormal and inconclusive findings on diagnostic imaging of breast: Secondary | ICD-10-CM | POA: Diagnosis not present

## 2017-07-08 DIAGNOSIS — R6884 Jaw pain: Secondary | ICD-10-CM | POA: Diagnosis not present

## 2017-07-08 DIAGNOSIS — M542 Cervicalgia: Secondary | ICD-10-CM | POA: Diagnosis not present

## 2017-07-08 DIAGNOSIS — R51 Headache: Secondary | ICD-10-CM | POA: Diagnosis not present

## 2017-07-15 DIAGNOSIS — R51 Headache: Secondary | ICD-10-CM | POA: Diagnosis not present

## 2017-07-15 DIAGNOSIS — R6884 Jaw pain: Secondary | ICD-10-CM | POA: Diagnosis not present

## 2017-07-15 DIAGNOSIS — M542 Cervicalgia: Secondary | ICD-10-CM | POA: Diagnosis not present

## 2017-07-22 DIAGNOSIS — R51 Headache: Secondary | ICD-10-CM | POA: Diagnosis not present

## 2017-07-22 DIAGNOSIS — M542 Cervicalgia: Secondary | ICD-10-CM | POA: Diagnosis not present

## 2017-07-22 DIAGNOSIS — R6884 Jaw pain: Secondary | ICD-10-CM | POA: Diagnosis not present

## 2017-07-24 DIAGNOSIS — R51 Headache: Secondary | ICD-10-CM | POA: Diagnosis not present

## 2017-07-24 DIAGNOSIS — M542 Cervicalgia: Secondary | ICD-10-CM | POA: Diagnosis not present

## 2017-07-24 DIAGNOSIS — R6884 Jaw pain: Secondary | ICD-10-CM | POA: Diagnosis not present

## 2017-07-31 DIAGNOSIS — R51 Headache: Secondary | ICD-10-CM | POA: Diagnosis not present

## 2017-07-31 DIAGNOSIS — M542 Cervicalgia: Secondary | ICD-10-CM | POA: Diagnosis not present

## 2017-07-31 DIAGNOSIS — R6884 Jaw pain: Secondary | ICD-10-CM | POA: Diagnosis not present

## 2017-08-06 ENCOUNTER — Telehealth: Payer: Self-pay | Admitting: Neurology

## 2017-08-06 DIAGNOSIS — M542 Cervicalgia: Secondary | ICD-10-CM | POA: Diagnosis not present

## 2017-08-06 DIAGNOSIS — R6884 Jaw pain: Secondary | ICD-10-CM | POA: Diagnosis not present

## 2017-08-06 DIAGNOSIS — R51 Headache: Secondary | ICD-10-CM | POA: Diagnosis not present

## 2017-08-06 NOTE — Telephone Encounter (Signed)
Shari Booth/[facility] [phone] scheduled delivery of botox for 08/12/17.

## 2017-08-07 DIAGNOSIS — G43711 Chronic migraine without aura, intractable, with status migrainosus: Secondary | ICD-10-CM | POA: Diagnosis not present

## 2017-08-07 NOTE — Telephone Encounter (Signed)
Noted, thank you

## 2017-08-14 ENCOUNTER — Encounter: Payer: Self-pay | Admitting: Neurology

## 2017-08-14 ENCOUNTER — Ambulatory Visit (INDEPENDENT_AMBULATORY_CARE_PROVIDER_SITE_OTHER): Payer: 59 | Admitting: Neurology

## 2017-08-14 ENCOUNTER — Telehealth: Payer: Self-pay | Admitting: Neurology

## 2017-08-14 VITALS — BP 108/67 | HR 76

## 2017-08-14 DIAGNOSIS — G43711 Chronic migraine without aura, intractable, with status migrainosus: Secondary | ICD-10-CM

## 2017-08-14 NOTE — Progress Notes (Signed)
Botox- 100 units x 2 vials Lot: [id number] Expiration: 11/2019 NDC: 8891-6945-03   Bacteriostatic 0.9% Sodium Chloride- 15mL total Lot: [id number] Expiration: 12/25/2018 NDC: 0349-1791-50  Dx: V69.794 S/P //BCrn

## 2017-08-14 NOTE — Telephone Encounter (Signed)
Pt. Needs botox in 12 weeks.

## 2017-08-15 DIAGNOSIS — M542 Cervicalgia: Secondary | ICD-10-CM | POA: Diagnosis not present

## 2017-08-15 DIAGNOSIS — R51 Headache: Secondary | ICD-10-CM | POA: Diagnosis not present

## 2017-08-15 DIAGNOSIS — R6884 Jaw pain: Secondary | ICD-10-CM | POA: Diagnosis not present

## 2017-08-15 NOTE — Telephone Encounter (Signed)
I called and scheduled the patient for her next injection.  °

## 2017-08-20 NOTE — Progress Notes (Signed)

## 2017-08-26 HISTORY — PX: OTHER SURGICAL HISTORY: SHX169

## 2017-09-02 DIAGNOSIS — Z111 Encounter for screening for respiratory tuberculosis: Secondary | ICD-10-CM | POA: Diagnosis not present

## 2017-09-10 ENCOUNTER — Telehealth: Payer: Self-pay | Admitting: Neurology

## 2017-09-10 NOTE — Telephone Encounter (Signed)
Yes please refill. thanks 

## 2017-09-10 NOTE — Telephone Encounter (Signed)
Patient requesting refill of Gralise called to [facility].

## 2017-09-11 ENCOUNTER — Encounter: Payer: Self-pay | Admitting: *Deleted

## 2017-09-11 MED ORDER — GABAPENTIN (ONCE-DAILY) 600 MG PO TABS: 600.0000 mg | ORAL_TABLET | Freq: Every day | ORAL | 2 refills | Status: DC

## 2017-09-11 NOTE — Addendum Note (Signed)
Addended by: Gildardo Griffes on: 09/11/2017 08:20 AM   Modules accepted: Orders

## 2017-09-11 NOTE — Telephone Encounter (Signed)
Gralise 600 mg tablet sent to [facility] (no longer using [facility] for [employer] [employer]). Receipt confirmed by pharmacy.

## 2017-09-17 DIAGNOSIS — Z111 Encounter for screening for respiratory tuberculosis: Secondary | ICD-10-CM | POA: Diagnosis not present

## 2017-10-14 DIAGNOSIS — J0120 Acute ethmoidal sinusitis, unspecified: Secondary | ICD-10-CM | POA: Diagnosis not present

## 2017-10-15 DIAGNOSIS — H31093 Other chorioretinal scars, bilateral: Secondary | ICD-10-CM | POA: Diagnosis not present

## 2017-10-15 DIAGNOSIS — H43391 Other vitreous opacities, right eye: Secondary | ICD-10-CM | POA: Diagnosis not present

## 2017-10-15 DIAGNOSIS — H33321 Round hole, right eye: Secondary | ICD-10-CM | POA: Diagnosis not present

## 2017-10-20 DIAGNOSIS — M545 Low back pain: Secondary | ICD-10-CM | POA: Diagnosis not present

## 2017-10-20 DIAGNOSIS — R1032 Left lower quadrant pain: Secondary | ICD-10-CM | POA: Diagnosis not present

## 2017-10-25 DIAGNOSIS — M542 Cervicalgia: Secondary | ICD-10-CM | POA: Diagnosis not present

## 2017-10-25 DIAGNOSIS — M545 Low back pain: Secondary | ICD-10-CM | POA: Diagnosis not present

## 2017-10-29 ENCOUNTER — Other Ambulatory Visit: Payer: Self-pay | Admitting: Orthopedic Surgery

## 2017-10-29 DIAGNOSIS — M545 Low back pain, unspecified: Secondary | ICD-10-CM

## 2017-10-29 DIAGNOSIS — M542 Cervicalgia: Secondary | ICD-10-CM

## 2017-10-30 DIAGNOSIS — G43711 Chronic migraine without aura, intractable, with status migrainosus: Secondary | ICD-10-CM | POA: Diagnosis not present

## 2017-10-31 DIAGNOSIS — H33321 Round hole, right eye: Secondary | ICD-10-CM | POA: Diagnosis not present

## 2017-11-03 DIAGNOSIS — M545 Low back pain: Secondary | ICD-10-CM | POA: Diagnosis not present

## 2017-11-03 DIAGNOSIS — M542 Cervicalgia: Secondary | ICD-10-CM | POA: Diagnosis not present

## 2017-11-07 DIAGNOSIS — M533 Sacrococcygeal disorders, not elsewhere classified: Secondary | ICD-10-CM | POA: Diagnosis not present

## 2017-11-07 DIAGNOSIS — M542 Cervicalgia: Secondary | ICD-10-CM | POA: Diagnosis not present

## 2017-11-07 DIAGNOSIS — M545 Low back pain: Secondary | ICD-10-CM | POA: Diagnosis not present

## 2017-11-11 ENCOUNTER — Other Ambulatory Visit: Payer: Self-pay | Admitting: Orthopedic Surgery

## 2017-11-11 DIAGNOSIS — M533 Sacrococcygeal disorders, not elsewhere classified: Secondary | ICD-10-CM

## 2017-11-13 ENCOUNTER — Ambulatory Visit: Payer: 59 | Admitting: Neurology

## 2017-11-17 ENCOUNTER — Ambulatory Visit (INDEPENDENT_AMBULATORY_CARE_PROVIDER_SITE_OTHER): Payer: 59 | Admitting: Neurology

## 2017-11-17 ENCOUNTER — Telehealth: Payer: Self-pay | Admitting: Neurology

## 2017-11-17 VITALS — BP 106/72 | HR 90 | Ht 64.5 in

## 2017-11-17 DIAGNOSIS — R109 Unspecified abdominal pain: Secondary | ICD-10-CM | POA: Diagnosis not present

## 2017-11-17 DIAGNOSIS — M542 Cervicalgia: Secondary | ICD-10-CM | POA: Diagnosis not present

## 2017-11-17 DIAGNOSIS — M545 Low back pain: Secondary | ICD-10-CM | POA: Diagnosis not present

## 2017-11-17 DIAGNOSIS — R1032 Left lower quadrant pain: Secondary | ICD-10-CM | POA: Diagnosis not present

## 2017-11-17 DIAGNOSIS — G43711 Chronic migraine without aura, intractable, with status migrainosus: Secondary | ICD-10-CM | POA: Diagnosis not present

## 2017-11-17 MED ORDER — GABAPENTIN (ONCE-DAILY) 300 & 600 MG PO MISC: 1200.0000 mg | Freq: Every day | ORAL | 0 refills | Status: DC

## 2017-11-17 MED ORDER — FREMANEZUMAB-VFRM 225 MG/1.5ML ~~LOC~~ SOSY: 225.0000 mg | PREFILLED_SYRINGE | SUBCUTANEOUS | 0 refills | Status: DC

## 2017-11-17 MED ORDER — GABAPENTIN (ONCE-DAILY) 600 MG PO TABS: 1200.0000 mg | ORAL_TABLET | Freq: Every day | ORAL | 11 refills | Status: DC

## 2017-11-17 NOTE — Progress Notes (Signed)
Interval history:  Baseline headache frequency is She has daily headaches. At least half of them are migrainous (15-20 in a month).  She has done tremendously well with only 5-10 headache days a month, headaches can be migrainous or tension headache and shooting pain on the right.  >50% reduction in frequency and severity of headaches and migraines. +masseters, +eyes, +lateral corrugators, +levator scapulae.      Consent Form Botulism Toxin Injection For Chronic Migraine  Botulism toxin has been approved by the Federal drug administration for treatment of chronic migraine. Botulism toxin does not cure chronic migraine and it may not be effective in some patients.  The administration of botulism toxin is accomplished by injecting a small amount of toxin into the muscles of the neck and head. Dosage must be titrated for each individual. Any benefits resulting from botulism toxin tend to wear off after 3 months with a repeat injection required if benefit is to be maintained. Injections are usually done every 3-4 months with maximum effect peak achieved by about 2 or 3 weeks. Botulism toxin is expensive and you should be sure of what costs you will incur resulting from the injection.  The side effects of botulism toxin use for chronic migraine may include:   -Transient, and usually mild, facial weakness with facial injections  -Transient, and usually mild, head or neck weakness with head/neck injections  -Reduction or loss of forehead facial animation due to forehead muscle              weakness  -Eyelid drooping  -Dry eye  -Pain at the site of injection or bruising at the site of injection  -Double vision  -Potential unknown long term risks  Contraindications: You should not have Botox if you are pregnant, nursing, allergic to albumin, have an infection, skin condition, or muscle weakness at the site of the injection, or have myasthenia gravis, Lambert-Eaton syndrome, or ALS.  It is also  possible that as with any injection, there may be an allergic reaction or no effect from the medication. Reduced effectiveness after repeated injections is sometimes seen and rarely infection at the injection site may occur. All care will be taken to prevent these side effects. If therapy is given over a long time, atrophy and wasting in the muscle injected may occur. Occasionally the patient's become refractory to treatment because they develop antibodies to the toxin. In this event, therapy needs to be modified.  I have read the above information and consent to the administration of botulism toxin.    ______________  _____   _________________  Patient signature     Date   Witness signature       BOTOX PROCEDURE NOTE FOR MIGRAINE HEADACHE    Contraindications and precautions discussed with patient(above). Aseptic procedure was observed and patient tolerated procedure. Procedure performed by Dr. Georgia Dom  The condition has existed for more than 6 months, and pt does not have a diagnosis of ALS, Myasthenia Gravis or Lambert-Eaton Syndrome. Risks and benefits of injections discussed and pt agrees to proceed with the procedure. Written consent obtained  These injections are medically necessary. He receives good benefits from these injections. These injections do not cause sedations or hallucinations which the oral therapies may cause.  Indication/Diagnosis: chronic migraine BOTOX(J0585) injection was performed according to protocol by [employer]. 200 units of BOTOX was dissolved into 4 cc NS.  NDC: [address]-40  Type of toxin: Botox  Description of procedure:  The patient was placed in  a sitting position. The standard protocol was used for Botox as follows, with 5 units of Botox injected at each site:   -Procerus muscle, midline injection  -Corrugator muscle, bilateral injection  -Frontalis muscle, bilateral injection, with 2 sites each side, medial injection was performed in  the upper one third of the frontalis muscle, in the region vertical from the medial inferior edge of the superior orbital rim. The lateral injection was again in the upper one third of the forehead vertically above the lateral limbus of the cornea, 1.5 cm lateral to the medial injection site.  -Temporalis muscle injection, 4 sites, bilaterally. The first injection was 3 cm above the tragus of the ear, second injection site was 1.5 cm to 3 cm up from the first injection site in line with the tragus of the ear. The third injection site was 1.5-3 cm forward between the first 2 injection sites. The fourth injection site was 1.5 cm posterior to the second injection site.  -Occipitalis muscle injection, 3 sites, bilaterally. The first injection was done one half way between the occipital protuberance and the tip of the mastoid process behind the ear. The second injection site was done lateral and superior to the first, 1 fingerbreadth from the first injection. The third injection site was 1 fingerbreadth superiorly and medially from the first injection site.  -Cervical paraspinal muscle injection, 2 sites, bilateral knee first injection site was 1 cm from the midline of the cervical spine, 3 cm inferior to the lower border of the occipital protuberance. The second injection site was 1.5 cm superiorly and laterally to the first injection site.  -Trapezius muscle injection was performed at 3 sites, bilaterally. The first injection site was in the upper trapezius muscle halfway between the inflection point of the neck, and the acromion. The second injection site was one half way between the acromion and the first injection site. The third injection was done between the first injection site and the inflection point of the neck.   Will return for repeat injection in 3 months.   A 200 unit sof Botox was used, 155 units were injected, the rest of the Botox was wasted. The patient tolerated the procedure well, there  were no complications of the above procedure.

## 2017-11-17 NOTE — Progress Notes (Signed)
Botox- 100 units x 2 vials Lot: [id number] Expiration: 03/2020 NDC: 7096-4383-81  0.9% Sodium Chloride- 25mL total Lot: [id number] Expiration: 02/2019 HKG:67703-403-52 Dx: Y81.859 S/P

## 2017-11-17 NOTE — Telephone Encounter (Signed)
Patient needs 12 week Botox appointment.

## 2017-11-18 NOTE — Telephone Encounter (Signed)
I called to schedule the patient, she did not answer so I left a VM asking her to call back.

## 2017-11-24 ENCOUNTER — Ambulatory Visit
Admission: RE | Admit: 2017-11-24 | Discharge: 2017-11-24 | Disposition: A | Discharge status: Home / Self Care | Payer: 59 | Source: Ambulatory Visit | Attending: Orthopedic Surgery | Admitting: Orthopedic Surgery

## 2017-11-24 ENCOUNTER — Other Ambulatory Visit: Payer: Self-pay

## 2017-11-24 DIAGNOSIS — M533 Sacrococcygeal disorders, not elsewhere classified: Secondary | ICD-10-CM

## 2017-11-25 ENCOUNTER — Other Ambulatory Visit: Payer: Self-pay | Admitting: Orthopedic Surgery

## 2017-11-25 DIAGNOSIS — M533 Sacrococcygeal disorders, not elsewhere classified: Secondary | ICD-10-CM

## 2017-11-26 ENCOUNTER — Other Ambulatory Visit: Payer: Self-pay

## 2017-11-26 ENCOUNTER — Other Ambulatory Visit: Payer: Self-pay | Admitting: Obstetrics

## 2017-11-26 ENCOUNTER — Telehealth: Payer: Self-pay | Admitting: Neurology

## 2017-11-26 DIAGNOSIS — Z1231 Encounter for screening mammogram for malignant neoplasm of breast: Secondary | ICD-10-CM

## 2017-11-26 NOTE — Telephone Encounter (Signed)
Pt requesting a refill for Fremanezumab-vfrm (AJOVY) 225 MG/1.5ML SOSY sent to [facility]. Also stating Avella will need a note stating why pt will need a double dose of Gralise.

## 2017-11-27 ENCOUNTER — Other Ambulatory Visit: Payer: Self-pay | Admitting: Neurology

## 2017-11-27 MED ORDER — FREMANEZUMAB-VFRM 225 MG/1.5ML ~~LOC~~ SOSY: 225.0000 mg | PREFILLED_SYRINGE | SUBCUTANEOUS | 11 refills | Status: DC

## 2017-11-27 NOTE — Telephone Encounter (Signed)
Spoke with the patient. She is aware that Ajovy was prescribed to her pharmacy. She also stated that she is doing well on the Gralise 1200 mg and Avella needed office notes to support the increase in the dose from 600 mg. She stated that [facility] faxed [place] twice. Have not seen any faxes.

## 2017-11-27 NOTE — Telephone Encounter (Signed)
Yes, I'll order the Ajovy. If she is doing well on the 600 she can stay on the 600 thanks

## 2017-11-28 DIAGNOSIS — R200 Anesthesia of skin: Secondary | ICD-10-CM | POA: Diagnosis not present

## 2017-11-28 DIAGNOSIS — R1032 Left lower quadrant pain: Secondary | ICD-10-CM | POA: Diagnosis not present

## 2017-11-28 DIAGNOSIS — N390 Urinary tract infection, site not specified: Secondary | ICD-10-CM | POA: Diagnosis not present

## 2017-11-28 DIAGNOSIS — R109 Unspecified abdominal pain: Secondary | ICD-10-CM | POA: Diagnosis not present

## 2017-12-01 DIAGNOSIS — M542 Cervicalgia: Secondary | ICD-10-CM | POA: Diagnosis not present

## 2017-12-01 DIAGNOSIS — M545 Low back pain: Secondary | ICD-10-CM | POA: Diagnosis not present

## 2017-12-01 NOTE — Telephone Encounter (Addendum)
Faxed recent office procedure note to [facility] for support of increase in Gralise dose to 1200 mg daily at bedtime vs. 600 mg daily at bedtime. Received a receipt of confirmation.

## 2017-12-03 DIAGNOSIS — M542 Cervicalgia: Secondary | ICD-10-CM | POA: Diagnosis not present

## 2017-12-03 DIAGNOSIS — M545 Low back pain: Secondary | ICD-10-CM | POA: Diagnosis not present

## 2017-12-05 ENCOUNTER — Other Ambulatory Visit: Payer: Self-pay | Admitting: Gastroenterology

## 2017-12-05 DIAGNOSIS — R1032 Left lower quadrant pain: Secondary | ICD-10-CM

## 2017-12-05 DIAGNOSIS — R634 Abnormal weight loss: Secondary | ICD-10-CM

## 2017-12-08 ENCOUNTER — Ambulatory Visit
Admission: RE | Admit: 2017-12-08 | Discharge: 2017-12-08 | Disposition: A | Discharge status: Home / Self Care | Payer: 59 | Source: Ambulatory Visit | Attending: Orthopedic Surgery | Admitting: Orthopedic Surgery

## 2017-12-08 DIAGNOSIS — M533 Sacrococcygeal disorders, not elsewhere classified: Secondary | ICD-10-CM

## 2017-12-08 MED ORDER — METHYLPREDNISOLONE ACETATE 40 MG/ML INJ SUSP (RADIOLOG: 120.0000 mg | Freq: Once | INTRAMUSCULAR | Status: DC

## 2017-12-09 DIAGNOSIS — R109 Unspecified abdominal pain: Secondary | ICD-10-CM | POA: Diagnosis not present

## 2017-12-09 DIAGNOSIS — R350 Frequency of micturition: Secondary | ICD-10-CM | POA: Diagnosis not present

## 2017-12-09 DIAGNOSIS — R319 Hematuria, unspecified: Secondary | ICD-10-CM | POA: Diagnosis not present

## 2017-12-10 ENCOUNTER — Other Ambulatory Visit: Payer: Self-pay | Admitting: Obstetrics

## 2017-12-10 ENCOUNTER — Ambulatory Visit
Admission: RE | Admit: 2017-12-10 | Discharge: 2017-12-10 | Disposition: A | Discharge status: Home / Self Care | Payer: 59 | Source: Ambulatory Visit | Attending: Gastroenterology | Admitting: Gastroenterology

## 2017-12-10 DIAGNOSIS — R1032 Left lower quadrant pain: Secondary | ICD-10-CM

## 2017-12-10 DIAGNOSIS — R3129 Other microscopic hematuria: Secondary | ICD-10-CM | POA: Diagnosis not present

## 2017-12-10 DIAGNOSIS — R634 Abnormal weight loss: Secondary | ICD-10-CM

## 2017-12-10 MED ORDER — IOPAMIDOL (ISOVUE-300) INJECTION 61%
100.0000 mL | Freq: Once | INTRAVENOUS | Status: AC | PRN
  Administered 2017-12-10: 100 mL via INTRAVENOUS

## 2017-12-15 ENCOUNTER — Other Ambulatory Visit: Payer: Self-pay

## 2017-12-17 DIAGNOSIS — M542 Cervicalgia: Secondary | ICD-10-CM | POA: Diagnosis not present

## 2017-12-17 DIAGNOSIS — M545 Low back pain: Secondary | ICD-10-CM | POA: Diagnosis not present

## 2017-12-29 ENCOUNTER — Ambulatory Visit
Admission: RE | Admit: 2017-12-29 | Discharge: 2017-12-29 | Disposition: A | Discharge status: Home / Self Care | Payer: 59 | Source: Ambulatory Visit | Attending: Obstetrics | Admitting: Obstetrics

## 2017-12-29 DIAGNOSIS — Z1231 Encounter for screening mammogram for malignant neoplasm of breast: Secondary | ICD-10-CM | POA: Diagnosis not present

## 2017-12-30 ENCOUNTER — Other Ambulatory Visit: Payer: Self-pay | Admitting: Obstetrics

## 2017-12-30 DIAGNOSIS — R928 Other abnormal and inconclusive findings on diagnostic imaging of breast: Secondary | ICD-10-CM

## 2018-01-01 ENCOUNTER — Ambulatory Visit
Admission: RE | Admit: 2018-01-01 | Discharge: 2018-01-01 | Disposition: A | Discharge status: Home / Self Care | Payer: 59 | Source: Ambulatory Visit | Attending: Obstetrics | Admitting: Obstetrics

## 2018-01-01 DIAGNOSIS — N6001 Solitary cyst of right breast: Secondary | ICD-10-CM | POA: Diagnosis not present

## 2018-01-01 DIAGNOSIS — R922 Inconclusive mammogram: Secondary | ICD-10-CM | POA: Diagnosis not present

## 2018-01-01 DIAGNOSIS — R928 Other abnormal and inconclusive findings on diagnostic imaging of breast: Secondary | ICD-10-CM

## 2018-01-05 ENCOUNTER — Other Ambulatory Visit: Payer: Self-pay | Admitting: Obstetrics

## 2018-01-06 NOTE — Patient Instructions (Addendum)
Your procedure is scheduled on:  Monday, May 20  Enter through the [employer] [facility] at: [place] up the phone at the desk and dial [phone].  Call this number if you have problems the morning of surgery: [phone].  Remember: Do NOT eat food after midnight Sunday  Do NOT drink clear liquids (including water) after: 7 am Monday, day of surgery.  Take these medicines the morning of surgery with a SIP OF WATER: cymbalta  Stop herbal medications, vitamin supplements, NSAIDS and ibuprofen at this time.  Do NOT wear jewelry (body piercing), metal hair clips/bobby pins, make-up, or nail polish. Do NOT wear lotions, powders, or perfumes.  You may wear deoderant. Do NOT shave for 48 hours prior to surgery. Do NOT bring valuables to the hospital.   Have a responsible adult drive you home and stay with you for 24 hours after your procedure.  Home with Husband Shari Booth cell [phone] or Parents Mr. Shari Booth cell [phone].

## 2018-01-08 ENCOUNTER — Other Ambulatory Visit: Payer: Self-pay | Admitting: Orthopedic Surgery

## 2018-01-08 DIAGNOSIS — M533 Sacrococcygeal disorders, not elsewhere classified: Secondary | ICD-10-CM

## 2018-01-09 ENCOUNTER — Ambulatory Visit (HOSPITAL_COMMUNITY)
Admission: RE | Admit: 2018-01-09 | Discharge: 2018-01-09 | Disposition: A | Discharge status: Home / Self Care | Payer: 59 | Source: Ambulatory Visit | Attending: Obstetrics | Admitting: Obstetrics

## 2018-01-09 ENCOUNTER — Other Ambulatory Visit (HOSPITAL_COMMUNITY): Payer: Self-pay

## 2018-01-09 ENCOUNTER — Encounter (HOSPITAL_COMMUNITY): Payer: Self-pay

## 2018-01-09 ENCOUNTER — Other Ambulatory Visit: Payer: Self-pay

## 2018-01-09 DIAGNOSIS — N736 Female pelvic peritoneal adhesions (postinfective): Secondary | ICD-10-CM | POA: Diagnosis not present

## 2018-01-09 DIAGNOSIS — R102 Pelvic and perineal pain: Secondary | ICD-10-CM | POA: Diagnosis present

## 2018-01-09 DIAGNOSIS — M545 Low back pain: Secondary | ICD-10-CM | POA: Diagnosis not present

## 2018-01-09 DIAGNOSIS — M542 Cervicalgia: Secondary | ICD-10-CM | POA: Diagnosis not present

## 2018-01-09 HISTORY — DX: Major depressive disorder, single episode, unspecified: F32.9

## 2018-01-09 HISTORY — DX: Irritable bowel syndrome, unspecified: K58.9

## 2018-01-09 HISTORY — DX: Depression, unspecified: F32.A

## 2018-01-09 LAB — CBC
HEMATOCRIT: 42.1 % (ref 36.0–46.0)
Hemoglobin: 13.8 g/dL (ref 12.0–15.0)
MCH: 29.9 pg (ref 26.0–34.0)
MCHC: 32.8 g/dL (ref 30.0–36.0)
MCV: 91.1 fL (ref 78.0–100.0)
Platelets: 262 10*3/uL (ref 150–400)
RBC: 4.62 MIL/uL (ref 3.87–5.11)
RDW: 13.2 % (ref 11.5–15.5)
WBC: 5.8 10*3/uL (ref 4.0–10.5)

## 2018-01-09 LAB — TYPE AND SCREEN
ABO/RH(D): O POS
Antibody Screen: NEGATIVE

## 2018-01-09 LAB — BASIC METABOLIC PANEL
Anion gap: 9 (ref 5–15)
BUN: 11 mg/dL (ref 6–20)
CHLORIDE: 103 mmol/L (ref 101–111)
CO2: 28 mmol/L (ref 22–32)
CREATININE: 0.81 mg/dL (ref 0.44–1.00)
Calcium: 8.9 mg/dL (ref 8.9–10.3)
GFR calc non Af Amer: >60 mL/min (ref >60)
Glucose, Bld: 79 mg/dL (ref 65–99)
POTASSIUM: 4 mmol/L (ref 3.5–5.1)
Sodium: 140 mmol/L (ref 135–145)

## 2018-01-11 NOTE — H&P (Signed)
CC: LLQ pain  HPI: 47 yo [id number] pt, s/p hysterectomy in 2016 presents with continued LLQ pain since her hysterectomy. 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 She proceeded to [facility] with resolution of pain for some time. At time of surgery adhesions were noted in LLQ. Sine then, slow progression of LLQ pain, which continues to worsen. Pt had nl pelvic MRI in 2016. 4/19 nl CT abdomen and pelvis and normal pelvic u/s. She has seen GI, orthopedics, PT. Her pain failed to resolve with trial of Uribel (pt with intermittent urinary frequency and hematuria), SI joint injections and dietary modification. She needs narcotics to control the pain.   Since last seen, pain still the same.    Past Medical History:  Diagnosis Date  . Anxiety   . Asthma    Hx with pregnancy only - No inhaler- no current problems  . Bell's palsy    at age 54 & age 85, no current problems  . Common migraine with intractable migraine 07/29/2016  . Depression   . Fibromyalgia   . IBS (irritable bowel syndrome)    diet controlled  . Neuromuscular disorder ([place])    trigeminal nuralga - not currently active    Past Surgical History:  Procedure Laterality Date  . ABDOMINAL HYSTERECTOMY  2016  . ABLATION    . ceserean section x 3    . COLONOSCOPY    . CYSTOSCOPY N/A 06/12/2015   Procedure: CYSTOSCOPY;  Surgeon: Aloha Gell, MD;  Location: [facility];  Service: Gynecology;  Laterality: N/A;  . DILATION AND CURETTAGE OF UTERUS    . ROBOTIC ASSISTED LAPAROSCOPIC LYSIS OF ADHESION  06/12/2015   Procedure: ROBOTIC ASSISTED LAPAROSCOPIC LYSIS OF ADHESION;  Surgeon: Aloha Gell, MD;  Location: [facility];  Service: Gynecology;;  . Arnetha Courser TOOTH EXTRACTION       All: Phenergan  PE: Vitals:   01/12/18 1215  BP: 105/65  Pulse: 75  Resp: 16  Temp: 98.1 F (36.7 C)  TempSrc: Oral  SpO2: 98%  Weight: 78.5 kg (173 lb)  Height: 5\' 4"  (1.626 m)    Gen: well appearing, no distress Abd: soft, NT, ND GU: def to OR LE: NT, no edema  A/P: 47 yo pt with continued LLQ pain for diagnostic l'scope, possible USO, possible cystoscopy, possible LOA. Pt aware R/B or surgery and that surgery may not be diagnostic or curative.

## 2018-01-12 ENCOUNTER — Encounter (HOSPITAL_COMMUNITY): Payer: Self-pay | Admitting: Emergency Medicine

## 2018-01-12 ENCOUNTER — Ambulatory Visit (HOSPITAL_COMMUNITY): Payer: 59 | Admitting: Anesthesiology

## 2018-01-12 ENCOUNTER — Encounter (HOSPITAL_COMMUNITY)
Admission: RE | Disposition: A | Discharge status: Home / Self Care | Payer: Self-pay | Source: Ambulatory Visit | Attending: Obstetrics

## 2018-01-12 ENCOUNTER — Other Ambulatory Visit: Payer: Self-pay

## 2018-01-12 ENCOUNTER — Ambulatory Visit (HOSPITAL_COMMUNITY)
Admission: RE | Admit: 2018-01-12 | Discharge: 2018-01-12 | Disposition: A | Discharge status: Home / Self Care | Payer: 59 | Source: Ambulatory Visit | Attending: Obstetrics | Admitting: Obstetrics

## 2018-01-12 DIAGNOSIS — Z8709 Personal history of other diseases of the respiratory system: Secondary | ICD-10-CM | POA: Insufficient documentation

## 2018-01-12 DIAGNOSIS — Z87891 Personal history of nicotine dependence: Secondary | ICD-10-CM | POA: Diagnosis not present

## 2018-01-12 DIAGNOSIS — F329 Major depressive disorder, single episode, unspecified: Secondary | ICD-10-CM | POA: Insufficient documentation

## 2018-01-12 DIAGNOSIS — G43011 Migraine without aura, intractable, with status migrainosus: Secondary | ICD-10-CM | POA: Diagnosis not present

## 2018-01-12 DIAGNOSIS — Z79899 Other long term (current) drug therapy: Secondary | ICD-10-CM | POA: Diagnosis not present

## 2018-01-12 DIAGNOSIS — R102 Pelvic and perineal pain: Secondary | ICD-10-CM | POA: Insufficient documentation

## 2018-01-12 DIAGNOSIS — Z90710 Acquired absence of both cervix and uterus: Secondary | ICD-10-CM | POA: Insufficient documentation

## 2018-01-12 DIAGNOSIS — F419 Anxiety disorder, unspecified: Secondary | ICD-10-CM | POA: Insufficient documentation

## 2018-01-12 DIAGNOSIS — R1032 Left lower quadrant pain: Secondary | ICD-10-CM | POA: Diagnosis not present

## 2018-01-12 DIAGNOSIS — K660 Peritoneal adhesions (postprocedural) (postinfection): Secondary | ICD-10-CM | POA: Diagnosis not present

## 2018-01-12 DIAGNOSIS — N994 Postprocedural pelvic peritoneal adhesions: Secondary | ICD-10-CM | POA: Insufficient documentation

## 2018-01-12 DIAGNOSIS — K589 Irritable bowel syndrome without diarrhea: Secondary | ICD-10-CM | POA: Diagnosis not present

## 2018-01-12 DIAGNOSIS — G500 Trigeminal neuralgia: Secondary | ICD-10-CM | POA: Diagnosis not present

## 2018-01-12 HISTORY — PX: LAPAROSCOPIC LYSIS OF ADHESIONS: SHX5905

## 2018-01-12 HISTORY — PX: LAPAROSCOPY: SHX197

## 2018-01-12 SURGERY — LAPAROSCOPY OPERATIVE
Anesthesia: General | Site: Abdomen

## 2018-01-12 SURGERY — LYSIS, ADHESIONS, ROBOT-ASSISTED, LAPAROSCOPIC
Anesthesia: General

## 2018-01-12 MED ORDER — ONDANSETRON HCL 4 MG/2ML IJ SOLN
INTRAMUSCULAR | Status: DC | PRN
  Administered 2018-01-12: 4 mg via INTRAVENOUS

## 2018-01-12 MED ORDER — DEXAMETHASONE SODIUM PHOSPHATE 10 MG/ML IJ SOLN
INTRAMUSCULAR | Status: AC
  Filled 2018-01-12: qty 1

## 2018-01-12 MED ORDER — DEXAMETHASONE SODIUM PHOSPHATE 10 MG/ML IJ SOLN
INTRAMUSCULAR | Status: DC | PRN
  Administered 2018-01-12: 10 mg via INTRAVENOUS

## 2018-01-12 MED ORDER — SCOPOLAMINE 1 MG/3DAYS TD PT72
1.0000 | MEDICATED_PATCH | Freq: Once | TRANSDERMAL | Status: DC
  Administered 2018-01-12: 1.5 mg via TRANSDERMAL

## 2018-01-12 MED ORDER — SODIUM CHLORIDE 0.9 % IJ SOLN
INTRAMUSCULAR | Status: AC
  Filled 2018-01-12: qty 20

## 2018-01-12 MED ORDER — SUGAMMADEX SODIUM 500 MG/5ML IV SOLN
INTRAVENOUS | Status: AC
  Filled 2018-01-12: qty 5

## 2018-01-12 MED ORDER — FENTANYL CITRATE (PF) 100 MCG/2ML IJ SOLN
25.0000 ug | INTRAMUSCULAR | Status: DC | PRN
  Administered 2018-01-12: 25 ug via INTRAVENOUS

## 2018-01-12 MED ORDER — PROPOFOL 10 MG/ML IV BOLUS
INTRAVENOUS | Status: DC | PRN
  Administered 2018-01-12: 150 mg via INTRAVENOUS

## 2018-01-12 MED ORDER — GLYCOPYRROLATE 0.2 MG/ML IV SOSY
PREFILLED_SYRINGE | INTRAVENOUS | Status: DC | PRN
  Administered 2018-01-12: .2 mg via INTRAVENOUS

## 2018-01-12 MED ORDER — LACTATED RINGERS IV SOLN
INTRAVENOUS | Status: DC
  Administered 2018-01-12 (×2): via INTRAVENOUS

## 2018-01-12 MED ORDER — KETOROLAC TROMETHAMINE 30 MG/ML IJ SOLN
INTRAMUSCULAR | Status: DC | PRN
  Administered 2018-01-12: 30 mg via INTRAVENOUS

## 2018-01-12 MED ORDER — FENTANYL CITRATE (PF) 100 MCG/2ML IJ SOLN
INTRAMUSCULAR | Status: AC
  Administered 2018-01-12: 25 ug via INTRAVENOUS
  Filled 2018-01-12: qty 2

## 2018-01-12 MED ORDER — SODIUM CHLORIDE 0.9 % IR SOLN
Status: DC | PRN
  Administered 2018-01-12: 3000 mL

## 2018-01-12 MED ORDER — FENTANYL CITRATE (PF) 100 MCG/2ML IJ SOLN
INTRAMUSCULAR | Status: DC | PRN
  Administered 2018-01-12 (×2): 50 ug via INTRAVENOUS

## 2018-01-12 MED ORDER — EPHEDRINE SULFATE 50 MG/ML IJ SOLN
5.0000 mg | Freq: Once | INTRAMUSCULAR | Status: AC
  Administered 2018-01-12: 5 mg via INTRAVENOUS

## 2018-01-12 MED ORDER — SODIUM CHLORIDE 0.9 % IJ SOLN
INTRAMUSCULAR | Status: DC | PRN
  Administered 2018-01-12: 10 mL via INTRAVENOUS

## 2018-01-12 MED ORDER — EPHEDRINE SULFATE 50 MG/ML IJ SOLN
INTRAMUSCULAR | Status: AC
  Administered 2018-01-12: 5 mg via INTRAVENOUS
  Filled 2018-01-12: qty 1

## 2018-01-12 MED ORDER — PROPOFOL 10 MG/ML IV BOLUS
INTRAVENOUS | Status: AC
  Filled 2018-01-12: qty 20

## 2018-01-12 MED ORDER — OXYCODONE HCL 5 MG PO TABS: 5.0000 mg | ORAL_TABLET | Freq: Once | ORAL | Status: DC | PRN

## 2018-01-12 MED ORDER — LIDOCAINE HCL (PF) 1 % IJ SOLN
INTRAMUSCULAR | Status: AC
  Filled 2018-01-12: qty 5

## 2018-01-12 MED ORDER — BUPIVACAINE HCL (PF) 0.25 % IJ SOLN
INTRAMUSCULAR | Status: AC
  Filled 2018-01-12: qty 30

## 2018-01-12 MED ORDER — ROCURONIUM BROMIDE 100 MG/10ML IV SOLN
INTRAVENOUS | Status: AC
  Filled 2018-01-12: qty 1

## 2018-01-12 MED ORDER — MIDAZOLAM HCL 2 MG/2ML IJ SOLN
INTRAMUSCULAR | Status: AC
  Filled 2018-01-12: qty 2

## 2018-01-12 MED ORDER — OXYCODONE HCL 5 MG/5ML PO SOLN: 5.0000 mg | Freq: Once | ORAL | Status: DC | PRN

## 2018-01-12 MED ORDER — ONDANSETRON HCL 4 MG/2ML IJ SOLN
INTRAMUSCULAR | Status: AC
  Filled 2018-01-12: qty 2

## 2018-01-12 MED ORDER — LIDOCAINE 2% (20 MG/ML) 5 ML SYRINGE
INTRAMUSCULAR | Status: DC | PRN
  Administered 2018-01-12: 40 mg via INTRAVENOUS

## 2018-01-12 MED ORDER — OXYCODONE-ACETAMINOPHEN 5-325 MG PO TABS: 1.0000 | ORAL_TABLET | ORAL | 0 refills | Status: AC | PRN

## 2018-01-12 MED ORDER — SUGAMMADEX SODIUM 200 MG/2ML IV SOLN
INTRAVENOUS | Status: DC | PRN
  Administered 2018-01-12: 200 mg via INTRAVENOUS

## 2018-01-12 MED ORDER — BUPIVACAINE HCL (PF) 0.25 % IJ SOLN
INTRAMUSCULAR | Status: DC | PRN
  Administered 2018-01-12: 14 mL
  Administered 2018-01-12: 16 mL

## 2018-01-12 MED ORDER — MIDAZOLAM HCL 5 MG/5ML IJ SOLN
INTRAMUSCULAR | Status: DC | PRN
  Administered 2018-01-12: 2 mg via INTRAVENOUS

## 2018-01-12 MED ORDER — EPHEDRINE SULFATE-NACL 50-0.9 MG/10ML-% IV SOSY
PREFILLED_SYRINGE | INTRAVENOUS | Status: DC | PRN
  Administered 2018-01-12 (×2): 5 mg via INTRAVENOUS

## 2018-01-12 MED ORDER — EPHEDRINE 5 MG/ML INJ
INTRAVENOUS | Status: AC
  Filled 2018-01-12: qty 10

## 2018-01-12 MED ORDER — ROCURONIUM BROMIDE 10 MG/ML (PF) SYRINGE
PREFILLED_SYRINGE | INTRAVENOUS | Status: DC | PRN
  Administered 2018-01-12: 5 mg via INTRAVENOUS
  Administered 2018-01-12: 35 mg via INTRAVENOUS

## 2018-01-12 MED ORDER — FENTANYL CITRATE (PF) 100 MCG/2ML IJ SOLN
INTRAMUSCULAR | Status: AC
  Filled 2018-01-12: qty 2

## 2018-01-12 MED ORDER — SCOPOLAMINE 1 MG/3DAYS TD PT72
MEDICATED_PATCH | TRANSDERMAL | Status: AC
  Administered 2018-01-12: 1.5 mg via TRANSDERMAL
  Filled 2018-01-12: qty 1

## 2018-01-12 MED ORDER — PROMETHAZINE HCL 25 MG/ML IJ SOLN: 6.2500 mg | INTRAMUSCULAR | Status: DC | PRN

## 2018-01-12 MED ORDER — KETOROLAC TROMETHAMINE 30 MG/ML IJ SOLN
INTRAMUSCULAR | Status: AC
  Filled 2018-01-12: qty 1

## 2018-01-12 MED ORDER — GLYCOPYRROLATE 0.2 MG/ML IJ SOLN
INTRAMUSCULAR | Status: AC
  Filled 2018-01-12: qty 1

## 2018-01-12 SURGICAL SUPPLY — 34 items
CABLE HIGH FREQUENCY MONO STRZ (ELECTRODE) ×5 IMPLANT
DECANTER SPIKE VIAL GLASS SM (MISCELLANEOUS) ×5 IMPLANT
DERMABOND ADVANCED (GAUZE/BANDAGES/DRESSINGS) ×2
DERMABOND ADVANCED .7 DNX12 (GAUZE/BANDAGES/DRESSINGS) ×3 IMPLANT
DRSG OPSITE POSTOP 3X4 (GAUZE/BANDAGES/DRESSINGS) ×5 IMPLANT
DURAPREP 26ML APPLICATOR (WOUND CARE) ×5 IMPLANT
ELECT REM PT RETURN 9FT ADLT (ELECTROSURGICAL) ×5
ELECTRODE REM PT RTRN 9FT ADLT (ELECTROSURGICAL) ×3 IMPLANT
FORCEPS CUTTING 33CM 5MM (CUTTING FORCEPS) IMPLANT
GLOVE BIO SURGEON STRL SZ 6.5 (GLOVE) ×4 IMPLANT
GLOVE BIO SURGEONS STRL SZ 6.5 (GLOVE) ×1
GLOVE BIOGEL PI IND STRL 7.0 (GLOVE) ×12 IMPLANT
GLOVE BIOGEL PI INDICATOR 7.0 (GLOVE) ×8
GOWN STRL REUS W/TWL LRG LVL3 (GOWN DISPOSABLE) ×10 IMPLANT
MANIPULATOR UTERINE 4.5 ZUMI (MISCELLANEOUS) ×5 IMPLANT
NEEDLE INSUFFLATION 120MM (ENDOMECHANICALS) ×5 IMPLANT
PACK LAPAROSCOPY BASIN (CUSTOM PROCEDURE TRAY) ×5 IMPLANT
PACK TRENDGUARD 450 HYBRID PRO (MISCELLANEOUS) ×3 IMPLANT
PENCIL BUTTON HOLSTER BLD 10FT (ELECTRODE) IMPLANT
POUCH SPECIMEN RETRIEVAL 10MM (ENDOMECHANICALS) IMPLANT
PROTECTOR NERVE ULNAR (MISCELLANEOUS) ×10 IMPLANT
SCISSORS LAP 5X35 DISP (ENDOMECHANICALS) ×5 IMPLANT
SET IRRIG TUBING LAPAROSCOPIC (IRRIGATION / IRRIGATOR) ×5 IMPLANT
SLEEVE XCEL OPT CAN 5 100 (ENDOMECHANICALS) ×10 IMPLANT
SOLUTION ELECTROLUBE (MISCELLANEOUS) IMPLANT
SUT VICRYL 0 UR6 27IN ABS (SUTURE) ×5 IMPLANT
SUT VICRYL 4-0 PS2 18IN ABS (SUTURE) ×5 IMPLANT
TOWEL OR 17X24 6PK STRL BLUE (TOWEL DISPOSABLE) ×10 IMPLANT
TRAY FOLEY W/BAG SLVR 14FR (SET/KITS/TRAYS/PACK) ×5 IMPLANT
TRENDGUARD 450 HYBRID PRO PACK (MISCELLANEOUS) ×5
TROCAR BALLN 12MMX100 BLUNT (TROCAR) IMPLANT
TROCAR XCEL NON-BLD 11X100MML (ENDOMECHANICALS) ×5 IMPLANT
TROCAR XCEL NON-BLD 5MMX100MML (ENDOMECHANICALS) ×5 IMPLANT
WARMER LAPAROSCOPE (MISCELLANEOUS) ×5 IMPLANT

## 2018-01-12 NOTE — Progress Notes (Signed)
7741 Umbilical incision with Honeycomb dressing dry & intact, no drainage noted. Rt lower abdominal puncture site dry & intact with Dermabond. Lt lower abdominal puncture site dry & intact with Dermabond. 4239 Umbilical incision with Honeycomb dressing dry & intact, no drainage noted. Rt lower abdominal puncture site dry & intact with Dermabond.  Lt lower abdominal puncture site dry & intact with Dermabond.

## 2018-01-12 NOTE — Discharge Instructions (Signed)
DISCHARGE INSTRUCTIONS: Diagnostic Laparoscopy with Lysis of Adhesions  The following instructions have been prepared to help you care for yourself upon your return home today.  **You may begin taking Ibuprofen or other NSAIDs after 6:00 pm today**  Wound care:  Do not get the incision wet for the first 24 hours. The incision should be kept clean and dry.  The belly button bandage may be removed Wednesday.  The other 2 incisions have skin glue that will begin to flake off after a week.  Should the incision become sore, red, and swollen after the first week, check with your doctor.  Personal hygiene:  Shower the day after your procedure.  Activity and limitations:  Do NOT drive or operate any equipment today.  Do NOT lift anything more than 15 pounds for 2-3 weeks after surgery.  Do NOT rest in bed all day.  Walking is encouraged. Walk each day, starting slowly with 5-minute walks 3 or 4 times a day. Slowly increase the length of your walks.  Walk up and down stairs slowly.  Do NOT do strenuous activities, such as golfing, playing tennis, bowling, running, biking, weight lifting, gardening, mowing, or vacuuming for 2-4 weeks. Ask your doctor when it is okay to start.  Diet: Eat a light meal as desired this evening. You may resume your usual diet tomorrow.  Return to work: This is dependent on the type of work you do. For the most part you can return to a desk job within a week of surgery. If you are more active at work, please discuss this with your doctor.  What to expect after your surgery: You may have a slight burning sensation when you urinate on the first day. You may have a very small amount of blood in the urine. Expect to have a small amount of vaginal discharge/light bleeding for 1-2 weeks. It is not unusual to have abdominal soreness and bruising for up to 2 weeks. You may be tired and need more rest for about 1 week. You may experience shoulder pain for 24-72 hours.  Lying flat in bed may relieve it.  Call your doctor for any of the following:  Develop a fever of 100.4 or greater  Inability to urinate 6 hours after discharge from hospital  Severe pain not relieved by pain medications  Persistent of heavy bleeding at incision site  Redness or swelling around incision site after a week  Increasing nausea or vomiting  Patient Signature________________________________________  Nurse Signature_________________________________________  Support person's signature_______________________________

## 2018-01-12 NOTE — Anesthesia Postprocedure Evaluation (Signed)
Anesthesia Post Note  Patient: Shari Booth  Procedure(s) Performed: Diagnostic Laparoscopy (N/A Abdomen) LAPAROSCOPIC LYSIS OF ADHESIONS     Patient location during evaluation: PACU Anesthesia Type: General Level of consciousness: awake and alert Pain management: pain level controlled Vital Signs Assessment: post-procedure vital signs reviewed and stable Respiratory status: spontaneous breathing, nonlabored ventilation and respiratory function stable Cardiovascular status: blood pressure returned to baseline and stable Postop Assessment: no apparent nausea or vomiting Anesthetic complications: no    Last Vitals:  Vitals:   01/12/18 1645 01/12/18 1700  BP: (!) 91/57 100/61  Pulse: 72 80  Resp: 14 14  Temp:    SpO2: 97% 99%    Last Pain:  Vitals:   01/12/18 1655  TempSrc:   PainSc: 2    Pain Goal: Patients Stated Pain Goal: 3 (01/12/18 1215)               Audry Pili

## 2018-01-12 NOTE — Brief Op Note (Signed)
01/12/2018  3:24 PM  PATIENT:  Shari Booth  47 y.o. female  PRE-OPERATIVE DIAGNOSIS:  Pelvic Pain  POST-OPERATIVE DIAGNOSIS:  pelvic pain  PROCEDURE:  Procedure(s): Diagnostic Laparoscopy (N/A) LAPAROSCOPIC LYSIS OF ADHESIONS  enterolysis  SURGEON:  Surgeon(s) and Role:    * Aloha Gell, MD - Primary    * Almquist, Earlyne Iba, MD - Assisting  PHYSICIAN ASSISTANT:   ASSISTANTSMurrell Redden, MD   ANESTHESIA:   local and general  EBL:  20 mL   BLOOD ADMINISTERED:none  DRAINS: Urinary Catheter (Foley) and removed in the OR   LOCAL MEDICATIONS USED:  MARCAINE     SPECIMEN:  No Specimen  DISPOSITION OF SPECIMEN:  N/A  COUNTS:  YES  TOURNIQUET:  * No tourniquets in log *  DICTATION: .Note written in EPIC  PLAN OF CARE: Discharge to home after PACU  PATIENT DISPOSITION:  PACU - hemodynamically stable.   Delay start of Pharmacological VTE agent (>24hrs) due to surgical blood loss or risk of bleeding: yes

## 2018-01-12 NOTE — Transfer of Care (Signed)
Immediate Anesthesia Transfer of Care Note  Patient: Shari Booth  Procedure(s) Performed: Diagnostic Laparoscopy (N/A Abdomen) LAPAROSCOPIC LYSIS OF ADHESIONS  Patient Location: PACU  Anesthesia Type:General  Level of Consciousness: sedated  Airway & Oxygen Therapy: Patient Spontanous Breathing  Post-op Assessment: Report given to RN  Post vital signs: Reviewed and stable  Last Vitals:  Vitals Value Taken Time  BP    Temp    Pulse    Resp 10 01/12/2018  3:21 PM  SpO2    Vitals shown include unvalidated device data.  Last Pain:  Vitals:   01/12/18 1215  TempSrc: Oral  PainSc: 4       Patients Stated Pain Goal: 3 (91/50/41 3643)  Complications: No apparent anesthesia complications

## 2018-01-12 NOTE — Op Note (Signed)
01/12/2018  3:24 PM  PATIENT:  Shari Booth  47 y.o. female  PRE-OPERATIVE DIAGNOSIS:  Pelvic Pain  POST-OPERATIVE DIAGNOSIS:  pelvic pain  PROCEDURE:  Procedure(s): Diagnostic Laparoscopy (N/A) LAPAROSCOPIC LYSIS OF ADHESIONS  enterolysis  SURGEON:  Surgeon(s) and Role:    * Aloha Gell, MD - Primary    * Almquist, Earlyne Iba, MD - Assisting  PHYSICIAN ASSISTANT:   ASSISTANTSMurrell Redden, MD   ANESTHESIA:   local and general  EBL:  20 mL   BLOOD ADMINISTERED:none  DRAINS: Urinary Catheter (Foley) and removed in the OR   LOCAL MEDICATIONS USED:  MARCAINE     SPECIMEN:  No Specimen  DISPOSITION OF SPECIMEN:  N/A  COUNTS:  YES  TOURNIQUET:  * No tourniquets in log *  DICTATION: .Note written in EPIC  PLAN OF CARE: Discharge to home after PACU  PATIENT DISPOSITION:  PACU - hemodynamically stable.   Delay start of Pharmacological VTE agent (>24hrs) due to surgical blood loss or risk of bleeding: yes    Antibiotics: none Complications: none  INDICATIONS: 47 year old [id number] 3 year status post robotic assisted total laparoscopic hysterectomy with bilateral salpingectomy presents with continued left lower quadrant pain. Patient has had for evaluation by radiology, GI, orthopedics, PT and has failed to find any solution to her left lower quadrant pain. Patient presents for diagnostic laparoscopy with possible  lysis of adhesions, and possible LSO or cystoscopy if indicated. Risks of procedure discussed with patient including bleeding, infection, damage to surrounding organs.     FINDINGS:  Normal liver edge, normal gallbladder, normal stomach, normal intestines and omentum. Unable to visualize the appendix. Normal right ovary. Left ovary never visualized despite extensive dissection and lysis adhesions. Presumably left ovary is in the retroperitoneal space. Adhesions between the omentum, bowel, bladder and left pelvic sidewall. Separation of the rectus diastasis.     TECHNIQUE:  The patient was taken to the operating room where general anesthesia was obtained without difficulty.  She was then placed in the dorsal lithotomy position and prepared and draped in sterile fashion.  After an adequate timeout was performed, a foley catheter was placed.   Attention was then turned to the patient's abdomen where a 5-mm skin incision was made on the umbilical fold after first injecting with half percent Marcaine.  The Veress needle was carefully introduced into the peritoneal cavity through the abdominal wall.  Intraperitoneal placement was confirmed with a saline filled syringe and by drop in intraabdominal pressure with insufflation of carbon dioxide gas.  Adequate pneumoperitoneum was obtained, and the 5-mm non-bladed optiview trocar and sleeve were then advanced without difficulty into the abdomen where intraabdominal placement was confirmed by the operative laparoscope. A survey of the patient's pelvis and abdomen revealed findings as above.  5 mm skin incisions were made in the left and right lower quadrant after injecting with anasthetic. Non-bladed trocars were placed under direct visualization. Using blunt and grasping instruments, anatomy was surveyed with findings as above.  Using countertraction and sharp scissors enteroclysis was carried out. Filmy adhesions between the bowel and pelvic sidewall were lysed. Additional adhesions between the bowel and the bladder edge were lysed. Cautious use of cautery was used. Thicker omental adhesions were removed after ensuring no bowel contents. Cautery was used on the sites. The right ovary was seen easily. After lysis of adhesions the cul-de-sac was visualized. The posterior cul-de-sac was free of adhesions. The anterior cul-de-sac and left pelvic sidewall had all of the adhesions removed. The left  ovary was not visualized. The peritoneal edge on the left was elevated to see if there was a clear dissection path to further  explore for the left ovary but none was found. The decision was made to not perform a retroperitoneal pelvic dissection in order to see the left ovary. MRI report was reviewed which stated "unremarkable ovaries". Patient had a preop pelvic ultrasound which did not see the left ovary but noted increased bowel gas in the area where the left ovary would be located. Patient has no history of left oophorectomy. Pathology from her prior hysterectomy did not show removal of the left ovary.  Copious irrigation with then a shot suction-irrigator was done. No bleeding was noted. 14 cc of half percent Marcaine mixed with 16 cc of saline was infused into the pelvis along the cut edges.  Pneumoperitoneum removed, sites still hemostatic. Instruments and ports removed.   Umbilical fascial incision was not closed to to its small size. 4-0 Monocryll used for skin incisions. Dermabond applied by nurse.   Zumi removed, Catheter removed.   The patient tolerated the procedure well.  Sponge, lap, and needle counts were correct times two.  The patient was then taken to the recovery room awake, extubated and  in stable condition.  Raekwan Spelman A. 08/10/2013 12:19 PM

## 2018-01-12 NOTE — Anesthesia Preprocedure Evaluation (Addendum)
Anesthesia Evaluation  Patient identified by MRN, date of birth, ID band Patient awake    Reviewed: Allergy & Precautions, NPO status , Patient's Chart, lab work & pertinent test results  History of Anesthesia Complications Negative for: history of anesthetic complications  Airway Mallampati: II  TM Distance: >3 FB Neck ROM: Full    Dental no notable dental hx. (+) Dental Advisory Given   Pulmonary asthma , former smoker,    Pulmonary exam normal breath sounds clear to auscultation       Cardiovascular negative cardio ROS Normal cardiovascular exam Rhythm:Regular Rate:Normal     Neuro/Psych  Headaches, Anxiety Depression Trigeminal neuralgia; hx Bell's Palsy with no residual deficits  Neuromuscular disease    GI/Hepatic Neg liver ROS, IBS   Endo/Other  negative endocrine ROS  Renal/GU negative Renal ROS  negative genitourinary   Musculoskeletal  (+) Fibromyalgia -  Abdominal   Peds negative pediatric ROS (+)  Hematology negative hematology ROS (+)   Anesthesia Other Findings   Reproductive/Obstetrics S/p hysterectomy                            Anesthesia Physical  Anesthesia Plan  ASA: II  Anesthesia Plan: General   Post-op Pain Management:    Induction: Intravenous  PONV Risk Score and Plan: 3 and Treatment may vary due to age or medical condition, Ondansetron, Dexamethasone, Midazolam and Scopolamine patch - Pre-op  Airway Management Planned: Oral ETT  Additional Equipment: None  Intra-op Plan:   Post-operative Plan: Extubation in OR  Informed Consent: I have reviewed the patients History and Physical, chart, labs and discussed the procedure including the risks, benefits and alternatives for the proposed anesthesia with the patient or authorized representative who has indicated his/her understanding and acceptance.   Dental advisory given  Plan Discussed with: CRNA  and Anesthesiologist  Anesthesia Plan Comments:         Anesthesia Quick Evaluation

## 2018-01-12 NOTE — Anesthesia Procedure Notes (Signed)
Procedure Name: Intubation Date/Time: 01/12/2018 1:48 PM Performed by: Verlin Grills, CRNA Pre-anesthesia Checklist: Patient identified, Emergency Drugs available, Suction available and Patient being monitored Patient Re-evaluated:Patient Re-evaluated prior to induction Oxygen Delivery Method: Circle system utilized Preoxygenation: Pre-oxygenation with 100% oxygen Induction Type: IV induction Ventilation: Mask ventilation without difficulty Laryngoscope Size: Miller and 2 Grade View: Grade I Tube type: Oral Tube size: 7.0 mm Number of attempts: 1 Airway Equipment and Method: Stylet Placement Confirmation: ETT inserted through vocal cords under direct vision,  positive ETCO2 and breath sounds checked- equal and bilateral Secured at: 21 cm Tube secured with: Tape Dental Injury: Teeth and Oropharynx as per pre-operative assessment  Comments: Atraumatic, teeth/lips as pre op

## 2018-01-13 ENCOUNTER — Encounter (HOSPITAL_COMMUNITY): Payer: Self-pay | Admitting: Obstetrics

## 2018-01-30 ENCOUNTER — Telehealth: Payer: Self-pay

## 2018-01-30 NOTE — Telephone Encounter (Signed)
Ajovy denied by insurance. Pt will be mailed the co payment card.

## 2018-01-30 NOTE — Telephone Encounter (Signed)
PA done on cover my meds for Ajovy.

## 2018-01-30 NOTE — Telephone Encounter (Signed)
Co payment card for [employer].

## 2018-01-30 NOTE — Telephone Encounter (Signed)
Rn call patient that her insurance denied. 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 Rn verified address of  [address] [place] [address]. Pt verbalized understanding.

## 2018-02-04 DIAGNOSIS — R109 Unspecified abdominal pain: Secondary | ICD-10-CM | POA: Diagnosis not present

## 2018-02-04 NOTE — Telephone Encounter (Signed)
As long as she can use the copay card for free

## 2018-02-09 ENCOUNTER — Ambulatory Visit
Admission: RE | Admit: 2018-02-09 | Discharge: 2018-02-09 | Disposition: A | Discharge status: Home / Self Care | Payer: 59 | Source: Ambulatory Visit | Attending: Orthopedic Surgery | Admitting: Orthopedic Surgery

## 2018-02-09 DIAGNOSIS — M533 Sacrococcygeal disorders, not elsewhere classified: Secondary | ICD-10-CM | POA: Diagnosis not present

## 2018-02-09 MED ORDER — METHYLPREDNISOLONE ACETATE 40 MG/ML INJ SUSP (RADIOLOG: 120.0000 mg | Freq: Once | INTRAMUSCULAR | Status: DC

## 2018-02-11 ENCOUNTER — Telehealth: Payer: Self-pay | Admitting: Neurology

## 2018-02-11 NOTE — Telephone Encounter (Signed)
I called to request a refill for the patients botox but it was a refill request too soon. They will initiate again in July.

## 2018-02-16 DIAGNOSIS — G43711 Chronic migraine without aura, intractable, with status migrainosus: Secondary | ICD-10-CM | POA: Diagnosis not present

## 2018-02-20 DIAGNOSIS — M533 Sacrococcygeal disorders, not elsewhere classified: Secondary | ICD-10-CM | POA: Diagnosis not present

## 2018-03-04 ENCOUNTER — Encounter: Payer: Self-pay | Admitting: Neurology

## 2018-03-04 ENCOUNTER — Ambulatory Visit (INDEPENDENT_AMBULATORY_CARE_PROVIDER_SITE_OTHER): Payer: 59 | Admitting: Neurology

## 2018-03-04 VITALS — BP 105/69 | HR 76

## 2018-03-04 DIAGNOSIS — G43711 Chronic migraine without aura, intractable, with status migrainosus: Secondary | ICD-10-CM

## 2018-03-04 NOTE — Progress Notes (Signed)
Botox- 100 units x 2 vials Lot: [id number] Expiration: 06/2020 NDC: 2620-3559-74  Bacteriostatic 0.9% Sodium Chloride- 10mL total Lot: [id number] Expiration: 12/25/2018 NDC: 3646-8032-12  Dx: Y48.250 S/P

## 2018-03-04 NOTE — Progress Notes (Signed)
Interval history:  She has done tremendously well with 1 headache days a month,. Baseline headache frequency is daily headaches with 15 migrainous.    >90% reduction in frequency and severity of headaches and migraines. +masseters, +eyes, +temples, +lateral corrugators, +levator scapulae, +spock brows, + behind ear auricular temporal.    Consent Form Botulism Toxin Injection For Chronic Migraine  Botulism toxin has been approved by the Federal drug administration for treatment of chronic migraine. Botulism toxin does not cure chronic migraine and it may not be effective in some patients.  The administration of botulism toxin is accomplished by injecting a small amount of toxin into the muscles of the neck and head. Dosage must be titrated for each individual. Any benefits resulting from botulism toxin tend to wear off after 3 months with a repeat injection required if benefit is to be maintained. Injections are usually done every 3-4 months with maximum effect peak achieved by about 2 or 3 weeks. Botulism toxin is expensive and you should be sure of what costs you will incur resulting from the injection.  The side effects of botulism toxin use for chronic migraine may include:   -Transient, and usually mild, facial weakness with facial injections  -Transient, and usually mild, head or neck weakness with head/neck injections  -Reduction or loss of forehead facial animation due to forehead muscle              weakness  -Eyelid drooping  -Dry eye  -Pain at the site of injection or bruising at the site of injection  -Double vision  -Potential unknown long term risks  Contraindications: You should not have Botox if you are pregnant, nursing, allergic to albumin, have an infection, skin condition, or muscle weakness at the site of the injection, or have myasthenia gravis, Lambert-Eaton syndrome, or ALS.  It is also possible that as with any injection, there may be an allergic reaction or no  effect from the medication. Reduced effectiveness after repeated injections is sometimes seen and rarely infection at the injection site may occur. All care will be taken to prevent these side effects. If therapy is given over a long time, atrophy and wasting in the muscle injected may occur. Occasionally the patient's become refractory to treatment because they develop antibodies to the toxin. In this event, therapy needs to be modified.  I have read the above information and consent to the administration of botulism toxin.    ______________  _____   _________________  Patient signature     Date   Witness signature       BOTOX PROCEDURE NOTE FOR MIGRAINE HEADACHE    Contraindications and precautions discussed with patient(above). Aseptic procedure was observed and patient tolerated procedure. Procedure performed by Dr. Georgia Dom  The condition has existed for more than 6 months, and pt does not have a diagnosis of ALS, Myasthenia Gravis or Lambert-Eaton Syndrome. Risks and benefits of injections discussed and pt agrees to proceed with the procedure. Written consent obtained  These injections are medically necessary. He receives good benefits from these injections. These injections do not cause sedations or hallucinations which the oral therapies may cause.  Indication/Diagnosis: chronic migraine BOTOX(J0585) injection was performed according to protocol by [employer]. 200 units of BOTOX was dissolved into 4 cc NS.  NDC: [address]-27  Type of toxin: Botox  Description of procedure:  The patient was placed in a sitting position. The standard protocol was used for Botox as follows, with 5 units of Botox  injected at each site:   -Procerus muscle, midline injection  -Corrugator muscle, bilateral injection  -Frontalis muscle, bilateral injection, with 2 sites each side, medial injection was performed in the upper one third of the frontalis muscle, in the region vertical from the  medial inferior edge of the superior orbital rim. The lateral injection was again in the upper one third of the forehead vertically above the lateral limbus of the cornea, 1.5 cm lateral to the medial injection site.  -Temporalis muscle injection, 4 sites, bilaterally. The first injection was 3 cm above the tragus of the ear, second injection site was 1.5 cm to 3 cm up from the first injection site in line with the tragus of the ear. The third injection site was 1.5-3 cm forward between the first 2 injection sites. The fourth injection site was 1.5 cm posterior to the second injection site.  -Occipitalis muscle injection, 3 sites, bilaterally. The first injection was done one half way between the occipital protuberance and the tip of the mastoid process behind the ear. The second injection site was done lateral and superior to the first, 1 fingerbreadth from the first injection. The third injection site was 1 fingerbreadth superiorly and medially from the first injection site.  -Cervical paraspinal muscle injection, 2 sites, bilateral knee first injection site was 1 cm from the midline of the cervical spine, 3 cm inferior to the lower border of the occipital protuberance. The second injection site was 1.5 cm superiorly and laterally to the first injection site.  -Trapezius muscle injection was performed at 3 sites, bilaterally. The first injection site was in the upper trapezius muscle halfway between the inflection point of the neck, and the acromion. The second injection site was one half way between the acromion and the first injection site. The third injection was done between the first injection site and the inflection point of the neck.   Will return for repeat injection in 3 months.   A 200 unit sof Botox was used, 155 units were injected, the rest of the Botox was wasted. The patient tolerated the procedure well, there were no complications of the above procedure.

## 2018-03-10 DIAGNOSIS — M461 Sacroiliitis, not elsewhere classified: Secondary | ICD-10-CM | POA: Diagnosis not present

## 2018-03-23 DIAGNOSIS — M533 Sacrococcygeal disorders, not elsewhere classified: Secondary | ICD-10-CM | POA: Diagnosis not present

## 2018-04-20 DIAGNOSIS — M533 Sacrococcygeal disorders, not elsewhere classified: Secondary | ICD-10-CM | POA: Diagnosis not present

## 2018-04-29 DIAGNOSIS — R531 Weakness: Secondary | ICD-10-CM | POA: Diagnosis not present

## 2018-04-29 DIAGNOSIS — M545 Low back pain: Secondary | ICD-10-CM | POA: Diagnosis not present

## 2018-04-29 DIAGNOSIS — M62838 Other muscle spasm: Secondary | ICD-10-CM | POA: Diagnosis not present

## 2018-05-03 ENCOUNTER — Other Ambulatory Visit: Payer: Self-pay | Admitting: Neurology

## 2018-05-04 DIAGNOSIS — M62838 Other muscle spasm: Secondary | ICD-10-CM | POA: Diagnosis not present

## 2018-05-04 DIAGNOSIS — R531 Weakness: Secondary | ICD-10-CM | POA: Diagnosis not present

## 2018-05-04 DIAGNOSIS — M545 Low back pain: Secondary | ICD-10-CM | POA: Diagnosis not present

## 2018-05-06 DIAGNOSIS — R531 Weakness: Secondary | ICD-10-CM | POA: Diagnosis not present

## 2018-05-06 DIAGNOSIS — M545 Low back pain: Secondary | ICD-10-CM | POA: Diagnosis not present

## 2018-05-06 DIAGNOSIS — M62838 Other muscle spasm: Secondary | ICD-10-CM | POA: Diagnosis not present

## 2018-05-11 DIAGNOSIS — M545 Low back pain: Secondary | ICD-10-CM | POA: Diagnosis not present

## 2018-05-11 DIAGNOSIS — R531 Weakness: Secondary | ICD-10-CM | POA: Diagnosis not present

## 2018-05-11 DIAGNOSIS — M62838 Other muscle spasm: Secondary | ICD-10-CM | POA: Diagnosis not present

## 2018-05-13 DIAGNOSIS — M545 Low back pain: Secondary | ICD-10-CM | POA: Diagnosis not present

## 2018-05-13 DIAGNOSIS — M62838 Other muscle spasm: Secondary | ICD-10-CM | POA: Diagnosis not present

## 2018-05-13 DIAGNOSIS — R531 Weakness: Secondary | ICD-10-CM | POA: Diagnosis not present

## 2018-05-20 DIAGNOSIS — M545 Low back pain: Secondary | ICD-10-CM | POA: Diagnosis not present

## 2018-05-20 DIAGNOSIS — R531 Weakness: Secondary | ICD-10-CM | POA: Diagnosis not present

## 2018-05-20 DIAGNOSIS — M62838 Other muscle spasm: Secondary | ICD-10-CM | POA: Diagnosis not present

## 2018-05-25 DIAGNOSIS — M545 Low back pain: Secondary | ICD-10-CM | POA: Diagnosis not present

## 2018-05-25 DIAGNOSIS — R531 Weakness: Secondary | ICD-10-CM | POA: Diagnosis not present

## 2018-05-25 DIAGNOSIS — M62838 Other muscle spasm: Secondary | ICD-10-CM | POA: Diagnosis not present

## 2018-05-27 DIAGNOSIS — M545 Low back pain: Secondary | ICD-10-CM | POA: Diagnosis not present

## 2018-05-27 DIAGNOSIS — M62838 Other muscle spasm: Secondary | ICD-10-CM | POA: Diagnosis not present

## 2018-05-27 DIAGNOSIS — R531 Weakness: Secondary | ICD-10-CM | POA: Diagnosis not present

## 2018-06-03 DIAGNOSIS — M545 Low back pain: Secondary | ICD-10-CM | POA: Diagnosis not present

## 2018-06-03 DIAGNOSIS — M62838 Other muscle spasm: Secondary | ICD-10-CM | POA: Diagnosis not present

## 2018-06-03 DIAGNOSIS — G43711 Chronic migraine without aura, intractable, with status migrainosus: Secondary | ICD-10-CM | POA: Diagnosis not present

## 2018-06-03 DIAGNOSIS — M25551 Pain in right hip: Secondary | ICD-10-CM | POA: Diagnosis not present

## 2018-06-03 DIAGNOSIS — R531 Weakness: Secondary | ICD-10-CM | POA: Diagnosis not present

## 2018-06-09 ENCOUNTER — Ambulatory Visit (INDEPENDENT_AMBULATORY_CARE_PROVIDER_SITE_OTHER): Payer: 59 | Admitting: Neurology

## 2018-06-09 DIAGNOSIS — G43711 Chronic migraine without aura, intractable, with status migrainosus: Secondary | ICD-10-CM | POA: Diagnosis not present

## 2018-06-09 NOTE — Progress Notes (Signed)
Interval history:  She has done tremendously well with NO MIGRAINES since last botox injection. Baseline headache frequency is daily headaches with 15 migrainous.  100% reduction in frequency and severity of headaches and migraines. +masseters, +eyes, +temples, +lateral corrugators, +levator scapulae, +spock brows, + behind ear auricular temporal. Also she has large breasts, we have discussed in the past a breast reduction may help with her cervicalgia, happy to document this for her should she need it.   Consent Form Botulism Toxin Injection For Chronic Migraine  Botulism toxin has been approved by the Federal drug administration for treatment of chronic migraine. Botulism toxin does not cure chronic migraine and it may not be effective in some patients.  The administration of botulism toxin is accomplished by injecting a small amount of toxin into the muscles of the neck and head. Dosage must be titrated for each individual. Any benefits resulting from botulism toxin tend to wear off after 3 months with a repeat injection required if benefit is to be maintained. Injections are usually done every 3-4 months with maximum effect peak achieved by about 2 or 3 weeks. Botulism toxin is expensive and you should be sure of what costs you will incur resulting from the injection.  The side effects of botulism toxin use for chronic migraine may include:   -Transient, and usually mild, facial weakness with facial injections  -Transient, and usually mild, head or neck weakness with head/neck injections  -Reduction or loss of forehead facial animation due to forehead muscle              weakness  -Eyelid drooping  -Dry eye  -Pain at the site of injection or bruising at the site of injection  -Double vision  -Potential unknown long term risks  Contraindications: You should not have Botox if you are pregnant, nursing, allergic to albumin, have an infection, skin condition, or muscle weakness at the  site of the injection, or have myasthenia gravis, Lambert-Eaton syndrome, or ALS.  It is also possible that as with any injection, there may be an allergic reaction or no effect from the medication. Reduced effectiveness after repeated injections is sometimes seen and rarely infection at the injection site may occur. All care will be taken to prevent these side effects. If therapy is given over a long time, atrophy and wasting in the muscle injected may occur. Occasionally the patient's become refractory to treatment because they develop antibodies to the toxin. In this event, therapy needs to be modified.  I have read the above information and consent to the administration of botulism toxin.    ______________  _____   _________________  Patient signature     Date   Witness signature       BOTOX PROCEDURE NOTE FOR MIGRAINE HEADACHE    Contraindications and precautions discussed with patient(above). Aseptic procedure was observed and patient tolerated procedure. Procedure performed by Dr. Georgia Dom  The condition has existed for more than 6 months, and pt does not have a diagnosis of ALS, Myasthenia Gravis or Lambert-Eaton Syndrome. Risks and benefits of injections discussed and pt agrees to proceed with the procedure. Written consent obtained  These injections are medically necessary. He receives good benefits from these injections. These injections do not cause sedations or hallucinations which the oral therapies may cause.  Indication/Diagnosis: chronic migraine BOTOX(J0585) injection was performed according to protocol by [employer]. 200 units of BOTOX was dissolved into 4 cc NS.  NDC: [address]-09  Type of toxin: Botox  Description of procedure:  The patient was placed in a sitting position. The standard protocol was used for Botox as follows, with 5 units of Botox injected at each site:   -Procerus muscle, midline injection  -Corrugator muscle, bilateral  injection  -Frontalis muscle, bilateral injection, with 2 sites each side, medial injection was performed in the upper one third of the frontalis muscle, in the region vertical from the medial inferior edge of the superior orbital rim. The lateral injection was again in the upper one third of the forehead vertically above the lateral limbus of the cornea, 1.5 cm lateral to the medial injection site.  -Temporalis muscle injection, 4 sites, bilaterally. The first injection was 3 cm above the tragus of the ear, second injection site was 1.5 cm to 3 cm up from the first injection site in line with the tragus of the ear. The third injection site was 1.5-3 cm forward between the first 2 injection sites. The fourth injection site was 1.5 cm posterior to the second injection site.  -Occipitalis muscle injection, 3 sites, bilaterally. The first injection was done one half way between the occipital protuberance and the tip of the mastoid process behind the ear. The second injection site was done lateral and superior to the first, 1 fingerbreadth from the first injection. The third injection site was 1 fingerbreadth superiorly and medially from the first injection site.  -Cervical paraspinal muscle injection, 2 sites, bilateral knee first injection site was 1 cm from the midline of the cervical spine, 3 cm inferior to the lower border of the occipital protuberance. The second injection site was 1.5 cm superiorly and laterally to the first injection site.  -Trapezius muscle injection was performed at 3 sites, bilaterally. The first injection site was in the upper trapezius muscle halfway between the inflection point of the neck, and the acromion. The second injection site was one half way between the acromion and the first injection site. The third injection was done between the first injection site and the inflection point of the neck.   Will return for repeat injection in 3 months.   A 200 unit sof Botox was  used, 155 units were injected, the rest of the Botox was wasted. The patient tolerated the procedure well, there were no complications of the above procedure.

## 2018-06-09 NOTE — Progress Notes (Signed)
Botox- 100 units x 2 vials Lot: [id number] Expiration: 11/2020 NDC: 0923-3007-62  Bacteriostatic 0.9% Sodium Chloride- 70mL total Lot: [id number] Expiration: 05/27/2019 NDC: 4562-5638-93  Dx: [id number] S/P

## 2018-06-12 DIAGNOSIS — Z9889 Other specified postprocedural states: Secondary | ICD-10-CM | POA: Diagnosis not present

## 2018-06-18 ENCOUNTER — Encounter: Payer: Self-pay | Admitting: *Deleted

## 2018-07-01 ENCOUNTER — Telehealth: Payer: Self-pay | Admitting: Neurology

## 2018-07-01 NOTE — Telephone Encounter (Signed)
Patient is considering breast reduction and needs a letter stating this may help her migraines. Please call and discuss.

## 2018-07-02 NOTE — Telephone Encounter (Signed)
Returned pt's call and LVM asking for call back. Left office number in message.  

## 2018-07-03 NOTE — Telephone Encounter (Signed)
Spoke with patient. She would like a letter to be written if possible by Dr. Jaynee Eagles in support of her breast reduction as this may help her migraines. Pt verbalized appreciation.

## 2018-07-03 NOTE — Telephone Encounter (Signed)
Pt returning RN's call.

## 2018-07-08 ENCOUNTER — Encounter: Payer: Self-pay | Admitting: Neurology

## 2018-07-08 NOTE — Telephone Encounter (Signed)
Called pt and LVM informing pt that the letter is at the front desk ready for pickup. Left office number in case patient needs to call back. No details about letter were left in vm.  Letter placed in envelope and taken to front desk.

## 2018-07-08 NOTE — Telephone Encounter (Signed)
Letter printed & signed by Dr. Jaynee Eagles.

## 2018-07-08 NOTE — Telephone Encounter (Signed)
Can you print ou tthe letter I just wrote? Thank you

## 2018-07-10 ENCOUNTER — Ambulatory Visit (INDEPENDENT_AMBULATORY_CARE_PROVIDER_SITE_OTHER): Payer: 59 | Admitting: Plastic Surgery

## 2018-07-10 ENCOUNTER — Encounter: Payer: Self-pay | Admitting: Plastic Surgery

## 2018-07-10 VITALS — BP 126/72 | HR 81 | Ht 65.0 in | Wt 180.0 lb

## 2018-07-10 DIAGNOSIS — G43711 Chronic migraine without aura, intractable, with status migrainosus: Secondary | ICD-10-CM | POA: Diagnosis not present

## 2018-07-10 DIAGNOSIS — M546 Pain in thoracic spine: Secondary | ICD-10-CM

## 2018-07-10 DIAGNOSIS — M542 Cervicalgia: Secondary | ICD-10-CM | POA: Insufficient documentation

## 2018-07-10 DIAGNOSIS — G8929 Other chronic pain: Secondary | ICD-10-CM

## 2018-07-10 DIAGNOSIS — N62 Hypertrophy of breast: Secondary | ICD-10-CM | POA: Diagnosis not present

## 2018-07-10 DIAGNOSIS — M549 Dorsalgia, unspecified: Secondary | ICD-10-CM | POA: Insufficient documentation

## 2018-07-10 NOTE — Progress Notes (Signed)
Patient ID: Shari Booth, female    DOB: 02/07/1971, 47 y.o.   MRN: [id number]   Chief Complaint  Patient presents with  . Breast Problem    Mammary Hyperplasia: The patient is a 47 y.o. female with a history of mammary hyperplasia for several years.  She has extremely large breasts causing symptoms that include the following: Back pain (upper and lower) and neck pain. She frequently pins bra cups higher on straps for better lift and relief. Notices relief when holding breast up in her hands. Shoulder straps causing grooves, pain occasionally requiring padding. Pain medication is sometimes required with motrin and tylenol.  Activities that are hindered by enlarged breasts include: running and exercise  Her breasts are extremely large and fairly symmetric.  She has hyperpigmentation of the inframammary area on both sides.  The sternal to nipple distance on the right is 29 cm and the left is 29 cm.  The IMF distance is 14 cm.  She is 5 feet 4 inches tall and weighs 180 pounds.  Preoperative bra size = 36 DDD cup.  The estimated excess breast tissue to be removed at the time of surgery = 700 grams on the left and 700 grams on the right.  Mammogram history: this year and negative.  Has maternal grandmother and aunt with history of breast cancer.    Review of Systems  Constitutional: Negative.  Negative for appetite change.  HENT: Negative.   Eyes: Negative.   Respiratory: Negative.   Cardiovascular: Negative.   Gastrointestinal: Negative.   Endocrine: Negative.   Genitourinary: Negative.   Musculoskeletal: Negative.   Skin: Negative for color change, pallor and wound.  Neurological: Negative.   Psychiatric/Behavioral: Negative.     Past Medical History:  Diagnosis Date  . Anxiety   . Asthma    Hx with pregnancy only - No inhaler- no current problems  . Bell's palsy    at age 35 & age 8, no current problems  . Common migraine with intractable migraine 07/29/2016  .  Depression   . Fibromyalgia   . IBS (irritable bowel syndrome)    diet controlled  . Neuromuscular disorder ([place])    trigeminal nuralga - not currently active    Past Surgical History:  Procedure Laterality Date  . ABDOMINAL HYSTERECTOMY  2016  . ABLATION    . ceserean section x 3    . COLONOSCOPY    . CYSTOSCOPY N/A 06/12/2015   Procedure: CYSTOSCOPY;  Surgeon: Aloha Gell, MD;  Location: [facility];  Service: Gynecology;  Laterality: N/A;  . DILATION AND CURETTAGE OF UTERUS    . LAPAROSCOPIC LYSIS OF ADHESIONS  01/12/2018   Procedure: LAPAROSCOPIC LYSIS OF ADHESIONS;  Surgeon: Aloha Gell, MD;  Location: [facility];  Service: Gynecology;;  . LAPAROSCOPY N/A 01/12/2018   Procedure: Diagnostic Laparoscopy;  Surgeon: Aloha Gell, MD;  Location: [facility];  Service: Gynecology;  Laterality: N/A;  . ROBOTIC ASSISTED LAPAROSCOPIC LYSIS OF ADHESION  06/12/2015   Procedure: ROBOTIC ASSISTED LAPAROSCOPIC LYSIS OF ADHESION;  Surgeon: Aloha Gell, MD;  Location: [facility];  Service: Gynecology;;  . Arnetha Courser TOOTH EXTRACTION        Current Outpatient Medications:  .  ARIPiprazole (ABILIFY) 5 MG tablet, Take 5 mg by mouth daily., Disp: , Rfl:  .  BOTOX 100 units SOLR injection, INJECT 155 UNITS INTRAMUSCULARLY INTO THE HEAD AND NECK AREA EVERY 3 MONTHS (GIVEN AT PRESCRIBERS OFFICE, DISCARD UNUSED), Disp: 2 vial, Rfl: 3 .  clonazePAM (  KLONOPIN) 0.5 MG tablet, Take 0.25 mg by mouth daily as needed for anxiety. , Disp: , Rfl:  .  DULoxetine (CYMBALTA) 60 MG capsule, Take 60 mg by mouth daily. , Disp: , Rfl:  .  Fremanezumab-vfrm (AJOVY) 225 MG/1.5ML SOSY, Inject 225 mg into the skin every 30 (thirty) days., Disp: 1 Syringe, Rfl: 11 .  Gabapentin, Once-Daily, (GRALISE) 600 MG TABS, Take 1,200 mg by mouth at bedtime., Disp: 60 tablet, Rfl: 11 .  Ibuprofen-Famotidine (DUEXIS PO), Take 800 mg by mouth 2 (two) times daily as needed., Disp: , Rfl:  .  methocarbamol (ROBAXIN) 500 MG tablet, Take 500 mg by  mouth every 8 (eight) hours as needed for muscle spasms. , Disp: , Rfl:  .  Multiple Vitamin (MULTIVITAMIN WITH MINERALS) TABS tablet, Take 1 tablet by mouth daily., Disp: , Rfl:  .  OnabotulinumtoxinA (BOTOX IJ), Inject as directed every 3 (three) months. Injection for migraines, Disp: , Rfl:  .  Probiotic Product (PROBIOTIC PO), Take 1 tablet by mouth daily., Disp: , Rfl:  .  traMADol (ULTRAM) 50 MG tablet, Take 50-100 mg by mouth 3 (three) times daily as needed. , Disp: , Rfl:  No current facility-administered medications for this visit.   Facility-Administered Medications Ordered in Other Visits:  .  gadopentetate dimeglumine (MAGNEVIST) injection 15 mL, 15 mL, Intravenous, Once PRN, Ward Givens, NP   Objective:   Vitals:   07/10/18 1329  BP: 126/72  Pulse: 81  SpO2: 98%    Physical Exam  Constitutional: She appears well-developed and well-nourished.  HENT:  Head: Normocephalic and atraumatic.  Eyes: Pupils are equal, round, and reactive to light. EOM are normal.  Cardiovascular: Normal rate and regular rhythm.  No murmur heard. Pulmonary/Chest: Effort normal.  Musculoskeletal: She exhibits no edema.  Neurological: She is alert.  Skin: Skin is warm.  Psychiatric: She has a normal mood and affect. Her behavior is normal. Judgment and thought content normal.    Assessment & Plan:  Chronic migraine without aura, with intractable migraine, so stated, with status migrainosus  Chronic bilateral thoracic back pain  Neck pain  Symptomatic mammary hypertrophy  Recommend bilateral breast reduction.  Will need to send [place] her PT note and neurology note.  We will also need copy of mammogram report.  [place], DO

## 2018-08-05 ENCOUNTER — Encounter (HOSPITAL_BASED_OUTPATIENT_CLINIC_OR_DEPARTMENT_OTHER): Payer: Self-pay | Admitting: *Deleted

## 2018-08-05 ENCOUNTER — Other Ambulatory Visit: Payer: Self-pay

## 2018-08-07 ENCOUNTER — Encounter: Payer: Self-pay | Admitting: Plastic Surgery

## 2018-08-07 ENCOUNTER — Ambulatory Visit (INDEPENDENT_AMBULATORY_CARE_PROVIDER_SITE_OTHER): Payer: 59 | Admitting: Physician Assistant

## 2018-08-07 VITALS — BP 100/70 | HR 80 | Ht 64.5 in | Wt 183.2 lb

## 2018-08-07 DIAGNOSIS — M546 Pain in thoracic spine: Secondary | ICD-10-CM

## 2018-08-07 DIAGNOSIS — N62 Hypertrophy of breast: Secondary | ICD-10-CM

## 2018-08-07 DIAGNOSIS — G8929 Other chronic pain: Secondary | ICD-10-CM

## 2018-08-07 DIAGNOSIS — M542 Cervicalgia: Secondary | ICD-10-CM

## 2018-08-07 MED ORDER — HYDROCODONE-ACETAMINOPHEN 5-325 MG PO TABS: 1.0000 | ORAL_TABLET | Freq: Three times a day (TID) | ORAL | 0 refills | Status: DC

## 2018-08-07 MED ORDER — CEPHALEXIN 500 MG PO CAPS: 500.0000 mg | ORAL_CAPSULE | Freq: Four times a day (QID) | ORAL | 0 refills | Status: DC

## 2018-08-07 MED ORDER — ONDANSETRON HCL 4 MG PO TABS: 4.0000 mg | ORAL_TABLET | Freq: Three times a day (TID) | ORAL | 0 refills | Status: DC | PRN

## 2018-08-07 NOTE — H&P (View-Only) (Signed)
  Subjective:     Patient ID: Shari Booth, female   DOB: Mar 11, 1971, 47 y.o.   MRN: [id number]  HPI Very pleasant 47 year old white female patient presents the clinic for preoperative visit.  Patient has a history of mammary hyperplasia.  She reports  for several years she has been having symptoms of upper back and lower back as well as cervical pain.  The pt had low back surgery last summer.  The  patient reports overall history of good health.  Review of Systems  Constitutional: Negative.   Respiratory: Negative.   Cardiovascular: Negative.   Gastrointestinal: Negative.   Skin: Negative.   Neurological: Negative.   Psychiatric/Behavioral: Negative.        Objective:   Physical Exam Constitutional:      Appearance: Normal appearance.  Pulmonary:     Effort: Pulmonary effort is normal.  Abdominal:     Palpations: Abdomen is soft.  Musculoskeletal: Normal range of motion.  Skin:    General: Skin is warm and dry.  Neurological:     Mental Status: She is alert and oriented to person, place, and time.  Psychiatric:        Mood and Affect: Mood normal.        Behavior: Behavior normal.        Thought Content: Thought content normal.        Judgment: Judgment normal.        Assessment:     Pre-op appt Breast reduction    Plan:     The risk that can be encountered with breast reduction were discussed and include the following but not limited to these:  Breast asymmetry, fluid accumulation, firmness of the breast, inability to breast feed, loss of nipple or areola, skin loss, decrease or no nipple sensation, fat necrosis of the breast tissue, bleeding, infection, healing delay.  There are risks of anesthesia, changes to skin sensation and injury to nerves or blood vessels.  The muscle can be temporarily or permanently injured.  You may have an allergic reaction to tape, suture, glue, blood products which can result in skin discoloration, swelling, pain, skin lesions, poor  healing.  Any of these can lead to the need for revisonal surgery or stage procedures.  A reduction has potential to interfere with diagnostic procedures.  Nipple or breast piercing can increase risks of infection.  This procedure is best done when the breast is fully developed.  Changes in the breast will continue to occur over time.  Pregnancy can alter the outcomes of previous breast reduction surgery, weight gain and weigh loss can also effect the long term appearance.   Pt will present to clinic in 1 week.  Pt said she is able to stay out of work for at least 1-2 weeks.  All questions were elicited and answered.  I went over all post op instructions.  Post op medications were sent in for her including - Norco, Zofran and Keflex

## 2018-08-07 NOTE — Progress Notes (Signed)
  Subjective:     Patient ID: Shari Booth, female   DOB: 10/11/1970, 47 y.o.   MRN: [id number]  HPI Very pleasant 47 year old white female patient presents the clinic for preoperative visit.  Patient has a history of mammary hyperplasia.  She reports  for several years she has been having symptoms of upper back and lower back as well as cervical pain.  The pt had low back surgery last summer.  The  patient reports overall history of good health.  Review of Systems  Constitutional: Negative.   Respiratory: Negative.   Cardiovascular: Negative.   Gastrointestinal: Negative.   Skin: Negative.   Neurological: Negative.   Psychiatric/Behavioral: Negative.        Objective:   Physical Exam Constitutional:      Appearance: Normal appearance.  Pulmonary:     Effort: Pulmonary effort is normal.  Abdominal:     Palpations: Abdomen is soft.  Musculoskeletal: Normal range of motion.  Skin:    General: Skin is warm and dry.  Neurological:     Mental Status: She is alert and oriented to person, place, and time.  Psychiatric:        Mood and Affect: Mood normal.        Behavior: Behavior normal.        Thought Content: Thought content normal.        Judgment: Judgment normal.        Assessment:     Pre-op appt Breast reduction    Plan:     The risk that can be encountered with breast reduction were discussed and include the following but not limited to these:  Breast asymmetry, fluid accumulation, firmness of the breast, inability to breast feed, loss of nipple or areola, skin loss, decrease or no nipple sensation, fat necrosis of the breast tissue, bleeding, infection, healing delay.  There are risks of anesthesia, changes to skin sensation and injury to nerves or blood vessels.  The muscle can be temporarily or permanently injured.  You may have an allergic reaction to tape, suture, glue, blood products which can result in skin discoloration, swelling, pain, skin lesions, poor  healing.  Any of these can lead to the need for revisonal surgery or stage procedures.  A reduction has potential to interfere with diagnostic procedures.  Nipple or breast piercing can increase risks of infection.  This procedure is best done when the breast is fully developed.  Changes in the breast will continue to occur over time.  Pregnancy can alter the outcomes of previous breast reduction surgery, weight gain and weigh loss can also effect the long term appearance.   Pt will present to clinic in 1 week.  Pt said she is able to stay out of work for at least 1-2 weeks.  All questions were elicited and answered.  I went over all post op instructions.  Post op medications were sent in for her including - Norco, Zofran and Keflex

## 2018-08-11 NOTE — Anesthesia Preprocedure Evaluation (Addendum)
Anesthesia Evaluation  Patient identified by MRN, date of birth, ID band Patient awake    Reviewed: Allergy & Precautions, NPO status , Patient's Chart, lab work & pertinent test results  History of Anesthesia Complications Negative for: history of anesthetic complications  Airway Mallampati: II  TM Distance: >3 FB Neck ROM: Full    Dental no notable dental hx. (+) Dental Advisory Given   Pulmonary asthma , former smoker,    Pulmonary exam normal breath sounds clear to auscultation       Cardiovascular negative cardio ROS Normal cardiovascular exam Rhythm:Regular Rate:Normal     Neuro/Psych  Headaches, PSYCHIATRIC DISORDERS Anxiety Depression Trigeminal neuralgia; hx Bell's Palsy with no residual deficits    GI/Hepatic negative GI ROS, Neg liver ROS, IBS   Endo/Other  negative endocrine ROS  Renal/GU negative Renal ROS  negative genitourinary   Musculoskeletal  (+) Fibromyalgia -  Abdominal   Peds negative pediatric ROS (+)  Hematology negative hematology ROS (+)   Anesthesia Other Findings   Reproductive/Obstetrics negative OB ROS S/p hysterectomy                             Anesthesia Physical  Anesthesia Plan  ASA: II  Anesthesia Plan: General   Post-op Pain Management:    Induction: Intravenous  PONV Risk Score and Plan: 3 and Treatment may vary due to age or medical condition, Ondansetron, Dexamethasone, Midazolam and Scopolamine patch - Pre-op  Airway Management Planned: Oral ETT  Additional Equipment: None  Intra-op Plan:   Post-operative Plan: Extubation in OR  Informed Consent: I have reviewed the patients History and Physical, chart, labs and discussed the procedure including the risks, benefits and alternatives for the proposed anesthesia with the patient or authorized representative who has indicated his/her understanding and acceptance.   Dental advisory  given  Plan Discussed with: CRNA  Anesthesia Plan Comments:        Anesthesia Quick Evaluation

## 2018-08-12 ENCOUNTER — Ambulatory Visit (HOSPITAL_BASED_OUTPATIENT_CLINIC_OR_DEPARTMENT_OTHER): Payer: 59 | Admitting: Certified Registered Nurse Anesthetist

## 2018-08-12 ENCOUNTER — Encounter (HOSPITAL_BASED_OUTPATIENT_CLINIC_OR_DEPARTMENT_OTHER)
Admission: RE | Disposition: A | Discharge status: Home / Self Care | Payer: Self-pay | Source: Home / Self Care | Attending: Plastic Surgery

## 2018-08-12 ENCOUNTER — Encounter (HOSPITAL_BASED_OUTPATIENT_CLINIC_OR_DEPARTMENT_OTHER): Payer: Self-pay

## 2018-08-12 ENCOUNTER — Other Ambulatory Visit: Payer: Self-pay

## 2018-08-12 ENCOUNTER — Ambulatory Visit (HOSPITAL_BASED_OUTPATIENT_CLINIC_OR_DEPARTMENT_OTHER)
Admission: RE | Admit: 2018-08-12 | Discharge: 2018-08-12 | Disposition: A | Discharge status: Home / Self Care | Payer: 59 | Attending: Plastic Surgery | Admitting: Plastic Surgery

## 2018-08-12 DIAGNOSIS — N62 Hypertrophy of breast: Secondary | ICD-10-CM

## 2018-08-12 DIAGNOSIS — M546 Pain in thoracic spine: Secondary | ICD-10-CM | POA: Diagnosis not present

## 2018-08-12 DIAGNOSIS — F329 Major depressive disorder, single episode, unspecified: Secondary | ICD-10-CM | POA: Diagnosis not present

## 2018-08-12 DIAGNOSIS — J45909 Unspecified asthma, uncomplicated: Secondary | ICD-10-CM | POA: Insufficient documentation

## 2018-08-12 DIAGNOSIS — R51 Headache: Secondary | ICD-10-CM | POA: Diagnosis not present

## 2018-08-12 DIAGNOSIS — G500 Trigeminal neuralgia: Secondary | ICD-10-CM | POA: Insufficient documentation

## 2018-08-12 DIAGNOSIS — M542 Cervicalgia: Secondary | ICD-10-CM | POA: Diagnosis not present

## 2018-08-12 DIAGNOSIS — M545 Low back pain: Secondary | ICD-10-CM | POA: Diagnosis not present

## 2018-08-12 DIAGNOSIS — F419 Anxiety disorder, unspecified: Secondary | ICD-10-CM | POA: Insufficient documentation

## 2018-08-12 DIAGNOSIS — N6489 Other specified disorders of breast: Secondary | ICD-10-CM | POA: Diagnosis not present

## 2018-08-12 DIAGNOSIS — K589 Irritable bowel syndrome without diarrhea: Secondary | ICD-10-CM | POA: Diagnosis not present

## 2018-08-12 DIAGNOSIS — Z90710 Acquired absence of both cervix and uterus: Secondary | ICD-10-CM | POA: Insufficient documentation

## 2018-08-12 DIAGNOSIS — M797 Fibromyalgia: Secondary | ICD-10-CM | POA: Diagnosis not present

## 2018-08-12 DIAGNOSIS — G8929 Other chronic pain: Secondary | ICD-10-CM | POA: Diagnosis not present

## 2018-08-12 DIAGNOSIS — Z87891 Personal history of nicotine dependence: Secondary | ICD-10-CM | POA: Insufficient documentation

## 2018-08-12 HISTORY — PX: BREAST REDUCTION SURGERY: SHX8

## 2018-08-12 SURGERY — MAMMOPLASTY, REDUCTION
Anesthesia: General | Site: Breast | Laterality: Bilateral

## 2018-08-12 MED ORDER — BUPIVACAINE-EPINEPHRINE 0.25% -1:200000 IJ SOLN
INTRAMUSCULAR | Status: DC | PRN
  Administered 2018-08-12: 22 mL

## 2018-08-12 MED ORDER — LACTATED RINGERS IV SOLN
INTRAVENOUS | Status: DC
  Administered 2018-08-12 (×3): via INTRAVENOUS

## 2018-08-12 MED ORDER — SODIUM CHLORIDE 0.9 % IV SOLN: 250.0000 mL | INTRAVENOUS | Status: DC | PRN

## 2018-08-12 MED ORDER — NITROGLYCERIN 2 % TD OINT
TOPICAL_OINTMENT | TRANSDERMAL | Status: AC
  Filled 2018-08-12: qty 30

## 2018-08-12 MED ORDER — DEXAMETHASONE SODIUM PHOSPHATE 4 MG/ML IJ SOLN
INTRAMUSCULAR | Status: DC | PRN
  Administered 2018-08-12: 10 mg via INTRAVENOUS

## 2018-08-12 MED ORDER — SODIUM CHLORIDE 0.9% FLUSH: 3.0000 mL | Freq: Two times a day (BID) | INTRAVENOUS | Status: DC

## 2018-08-12 MED ORDER — BUPIVACAINE-EPINEPHRINE (PF) 0.25% -1:200000 IJ SOLN
INTRAMUSCULAR | Status: AC
  Filled 2018-08-12: qty 30

## 2018-08-12 MED ORDER — MEPERIDINE HCL 25 MG/ML IJ SOLN: 6.2500 mg | INTRAMUSCULAR | Status: DC | PRN

## 2018-08-12 MED ORDER — LIDOCAINE HCL (PF) 1 % IJ SOLN
INTRAMUSCULAR | Status: AC
  Filled 2018-08-12: qty 90

## 2018-08-12 MED ORDER — MIDAZOLAM HCL 2 MG/2ML IJ SOLN
1.0000 mg | INTRAMUSCULAR | Status: DC | PRN
  Administered 2018-08-12: 2 mg via INTRAVENOUS

## 2018-08-12 MED ORDER — SCOPOLAMINE 1 MG/3DAYS TD PT72: 1.0000 | MEDICATED_PATCH | Freq: Once | TRANSDERMAL | Status: DC | PRN

## 2018-08-12 MED ORDER — ACETAMINOPHEN 325 MG PO TABS: 650.0000 mg | ORAL_TABLET | ORAL | Status: DC | PRN

## 2018-08-12 MED ORDER — NITROGLYCERIN 2 % TD OINT: 0.5000 [in_us] | TOPICAL_OINTMENT | Freq: Two times a day (BID) | TRANSDERMAL | Status: DC

## 2018-08-12 MED ORDER — SUFENTANIL CITRATE 50 MCG/ML IV SOLN
INTRAVENOUS | Status: DC | PRN
  Administered 2018-08-12: 20 ug via INTRAVENOUS
  Administered 2018-08-12: 10 ug via INTRAVENOUS
  Administered 2018-08-12: 5 ug via INTRAVENOUS

## 2018-08-12 MED ORDER — ROCURONIUM BROMIDE 100 MG/10ML IV SOLN
INTRAVENOUS | Status: DC | PRN
  Administered 2018-08-12: 40 mg via INTRAVENOUS
  Administered 2018-08-12: 10 mg via INTRAVENOUS

## 2018-08-12 MED ORDER — HYDROMORPHONE HCL 1 MG/ML IJ SOLN
INTRAMUSCULAR | Status: AC
  Filled 2018-08-12: qty 0.5

## 2018-08-12 MED ORDER — SODIUM CHLORIDE 0.9 % IV SOLN
INTRAVENOUS | Status: DC | PRN
  Administered 2018-08-12: 500 mL

## 2018-08-12 MED ORDER — NITROGLYCERIN 2 % TD OINT
TOPICAL_OINTMENT | TRANSDERMAL | Status: DC | PRN
  Administered 2018-08-12: 2 [in_us] via TOPICAL

## 2018-08-12 MED ORDER — CEFAZOLIN SODIUM-DEXTROSE 2-4 GM/100ML-% IV SOLN
INTRAVENOUS | Status: AC
  Filled 2018-08-12: qty 100

## 2018-08-12 MED ORDER — EPHEDRINE SULFATE 50 MG/ML IJ SOLN
INTRAMUSCULAR | Status: DC | PRN
  Administered 2018-08-12: 15 mg via INTRAVENOUS
  Administered 2018-08-12 (×3): 10 mg via INTRAVENOUS

## 2018-08-12 MED ORDER — ACETAMINOPHEN 650 MG RE SUPP: 650.0000 mg | RECTAL | Status: DC | PRN

## 2018-08-12 MED ORDER — EPINEPHRINE 30 MG/30ML IJ SOLN
INTRAMUSCULAR | Status: AC
  Filled 2018-08-12: qty 1

## 2018-08-12 MED ORDER — LIDOCAINE HCL (CARDIAC) PF 100 MG/5ML IV SOSY
PREFILLED_SYRINGE | INTRAVENOUS | Status: DC | PRN
  Administered 2018-08-12: 100 mg via INTRAVENOUS

## 2018-08-12 MED ORDER — OXYCODONE HCL 5 MG PO TABS
ORAL_TABLET | ORAL | Status: AC
  Filled 2018-08-12: qty 1

## 2018-08-12 MED ORDER — CEFAZOLIN SODIUM-DEXTROSE 2-4 GM/100ML-% IV SOLN
2.0000 g | INTRAVENOUS | Status: AC
  Administered 2018-08-12: 2 g via INTRAVENOUS

## 2018-08-12 MED ORDER — SUGAMMADEX SODIUM 200 MG/2ML IV SOLN
INTRAVENOUS | Status: DC | PRN
  Administered 2018-08-12: 250 mg via INTRAVENOUS

## 2018-08-12 MED ORDER — FENTANYL CITRATE (PF) 100 MCG/2ML IJ SOLN: 50.0000 ug | INTRAMUSCULAR | Status: DC | PRN

## 2018-08-12 MED ORDER — OXYCODONE HCL 5 MG/5ML PO SOLN: 5.0000 mg | Freq: Once | ORAL | Status: AC | PRN

## 2018-08-12 MED ORDER — PROMETHAZINE HCL 25 MG/ML IJ SOLN: 6.2500 mg | INTRAMUSCULAR | Status: DC | PRN

## 2018-08-12 MED ORDER — MIDAZOLAM HCL 2 MG/2ML IJ SOLN
INTRAMUSCULAR | Status: AC
  Filled 2018-08-12: qty 2

## 2018-08-12 MED ORDER — HYDROMORPHONE HCL 1 MG/ML IJ SOLN
0.2500 mg | INTRAMUSCULAR | Status: DC | PRN
  Administered 2018-08-12 (×2): 0.5 mg via INTRAVENOUS

## 2018-08-12 MED ORDER — SUFENTANIL CITRATE 50 MCG/ML IV SOLN
INTRAVENOUS | Status: AC
  Filled 2018-08-12: qty 1

## 2018-08-12 MED ORDER — PROPOFOL 10 MG/ML IV BOLUS
INTRAVENOUS | Status: DC | PRN
  Administered 2018-08-12: 160 mg via INTRAVENOUS

## 2018-08-12 MED ORDER — ONDANSETRON HCL 4 MG/2ML IJ SOLN
INTRAMUSCULAR | Status: DC | PRN
  Administered 2018-08-12: 4 mg via INTRAVENOUS

## 2018-08-12 MED ORDER — SODIUM CHLORIDE 0.9 % IV SOLN
INTRAVENOUS | Status: AC
  Filled 2018-08-12: qty 500000

## 2018-08-12 MED ORDER — SODIUM CHLORIDE 0.9% FLUSH: 3.0000 mL | INTRAVENOUS | Status: DC | PRN

## 2018-08-12 MED ORDER — OXYCODONE HCL 5 MG PO TABS: 5.0000 mg | ORAL_TABLET | ORAL | Status: DC | PRN

## 2018-08-12 MED ORDER — OXYCODONE HCL 5 MG PO TABS
5.0000 mg | ORAL_TABLET | Freq: Once | ORAL | Status: AC | PRN
  Administered 2018-08-12: 5 mg via ORAL

## 2018-08-12 SURGICAL SUPPLY — 68 items
BAG DECANTER FOR FLEXI CONT (MISCELLANEOUS) ×3 IMPLANT
BINDER BREAST LRG (GAUZE/BANDAGES/DRESSINGS) IMPLANT
BINDER BREAST MEDIUM (GAUZE/BANDAGES/DRESSINGS) IMPLANT
BINDER BREAST XLRG (GAUZE/BANDAGES/DRESSINGS) ×3 IMPLANT
BINDER BREAST XXLRG (GAUZE/BANDAGES/DRESSINGS) IMPLANT
BIOPATCH RED 1 DISK 7.0 (GAUZE/BANDAGES/DRESSINGS) IMPLANT
BIOPATCH RED 1IN DISK 7.0MM (GAUZE/BANDAGES/DRESSINGS)
BLADE HEX COATED 2.75 (ELECTRODE) ×3 IMPLANT
BLADE KNIFE PERSONA 10 (BLADE) ×9 IMPLANT
BLADE SURG 15 STRL LF DISP TIS (BLADE) IMPLANT
BLADE SURG 15 STRL SS (BLADE)
BNDG GAUZE ELAST 4 BULKY (GAUZE/BANDAGES/DRESSINGS) IMPLANT
CANISTER SUCT 1200ML W/VALVE (MISCELLANEOUS) ×6 IMPLANT
CHLORAPREP W/TINT 26ML (MISCELLANEOUS) ×3 IMPLANT
COVER BACK TABLE 60X90IN (DRAPES) ×3 IMPLANT
COVER MAYO STAND STRL (DRAPES) ×3 IMPLANT
COVER WAND RF STERILE (DRAPES) IMPLANT
DECANTER SPIKE VIAL GLASS SM (MISCELLANEOUS) IMPLANT
DERMABOND ADVANCED (GAUZE/BANDAGES/DRESSINGS) ×4
DERMABOND ADVANCED .7 DNX12 (GAUZE/BANDAGES/DRESSINGS) ×2 IMPLANT
DRAIN CHANNEL 19F RND (DRAIN) IMPLANT
DRAPE LAPAROSCOPIC ABDOMINAL (DRAPES) ×3 IMPLANT
DRSG PAD ABDOMINAL 8X10 ST (GAUZE/BANDAGES/DRESSINGS) ×6 IMPLANT
ELECT BLADE 4.0 EZ CLEAN MEGAD (MISCELLANEOUS)
ELECT REM PT RETURN 9FT ADLT (ELECTROSURGICAL) ×3
ELECTRODE BLDE 4.0 EZ CLN MEGD (MISCELLANEOUS) IMPLANT
ELECTRODE REM PT RTRN 9FT ADLT (ELECTROSURGICAL) ×1 IMPLANT
EVACUATOR SILICONE 100CC (DRAIN) IMPLANT
GAUZE SPONGE 4X4 12PLY STRL LF (GAUZE/BANDAGES/DRESSINGS) ×3 IMPLANT
GLOVE BIO SURGEON STRL SZ 6.5 (GLOVE) ×10 IMPLANT
GLOVE BIO SURGEONS STRL SZ 6.5 (GLOVE) ×5
GLOVE BIOGEL PI IND STRL 6.5 (GLOVE) ×1 IMPLANT
GLOVE BIOGEL PI INDICATOR 6.5 (GLOVE) ×2
GLOVE EXAM NITRILE MD LF STRL (GLOVE) ×3 IMPLANT
GLOVE SURG SS PI 6.5 STRL IVOR (GLOVE) ×3 IMPLANT
GOWN STRL REUS W/ TWL LRG LVL3 (GOWN DISPOSABLE) ×4 IMPLANT
GOWN STRL REUS W/TWL LRG LVL3 (GOWN DISPOSABLE) ×8
NDL SAFETY ECLIPSE 18X1.5 (NEEDLE) IMPLANT
NEEDLE HYPO 18GX1.5 SHARP (NEEDLE)
NEEDLE HYPO 25X1 1.5 SAFETY (NEEDLE) ×3 IMPLANT
NS IRRIG 1000ML POUR BTL (IV SOLUTION) IMPLANT
PACK BASIN DAY SURGERY FS (CUSTOM PROCEDURE TRAY) ×3 IMPLANT
PAD ALCOHOL SWAB (MISCELLANEOUS) IMPLANT
PENCIL BUTTON HOLSTER BLD 10FT (ELECTRODE) ×3 IMPLANT
PIN SAFETY STERILE (MISCELLANEOUS) IMPLANT
SLEEVE SCD COMPRESS KNEE MED (MISCELLANEOUS) ×3 IMPLANT
SPONGE LAP 18X18 RF (DISPOSABLE) ×12 IMPLANT
STRIP SUTURE WOUND CLOSURE 1/2 (SUTURE) ×9 IMPLANT
SUT MNCRL AB 4-0 PS2 18 (SUTURE) ×21 IMPLANT
SUT MON AB 3-0 SH 27 (SUTURE) ×10
SUT MON AB 3-0 SH27 (SUTURE) ×5 IMPLANT
SUT MON AB 5-0 PS2 18 (SUTURE) ×15 IMPLANT
SUT PDS 3-0 CT2 (SUTURE)
SUT PDS AB 2-0 CT2 27 (SUTURE) IMPLANT
SUT PDS II 3-0 CT2 27 ABS (SUTURE) IMPLANT
SUT SILK 3 0 PS 1 (SUTURE) IMPLANT
SYR 3ML 23GX1 SAFETY (SYRINGE) IMPLANT
SYR 50ML LL SCALE MARK (SYRINGE) IMPLANT
SYR BULB IRRIGATION 50ML (SYRINGE) ×3 IMPLANT
SYR CONTROL 10ML LL (SYRINGE) ×3 IMPLANT
TAPE MEASURE VINYL STERILE (MISCELLANEOUS) ×3 IMPLANT
TOWEL GREEN STERILE FF (TOWEL DISPOSABLE) ×6 IMPLANT
TUBE CONNECTING 20'X1/4 (TUBING) ×1
TUBE CONNECTING 20X1/4 (TUBING) ×2 IMPLANT
TUBING INFILTRATION IT-10001 (TUBING) IMPLANT
TUBING SET GRADUATE ASPIR 12FT (MISCELLANEOUS) IMPLANT
UNDERPAD 30X30 (UNDERPADS AND DIAPERS) ×6 IMPLANT
YANKAUER SUCT BULB TIP NO VENT (SUCTIONS) ×3 IMPLANT

## 2018-08-12 NOTE — Op Note (Addendum)
Breast Reduction Op note:    DATE OF PROCEDURE: 08/12/2018  LOCATION: [place]  SURGEON: Lyndee Leo Sanger Sophi Calligan, DO  ASSISTANT:  Ronette Deter, PA and [place], RNFA  PREOPERATIVE DIAGNOSIS 1. Macromastia 2. Neck Pain 3. Back Pain  POSTOPERATIVE DIAGNOSIS 1. Macromastia 2. Neck Pain 3. Back Pain  PROCEDURES 1. Bilateral breast reduction.  Right reduction 571g, Left reduction 785Y  COMPLICATIONS: None.  DRAINS: none  INDICATIONS FOR PROCEDURE Shari Booth is a 47 y.o. year-old female born on 1970-11-24,with a history of symptomatic macromastia with concominant back pain, neck pain, shoulder grooving from her bra.   MRN: [id number]  CONSENT Informed consent was obtained directly from the patient. The risks, benefits and alternatives were fully discussed. Specific risks including but not limited to bleeding, infection, hematoma, seroma, scarring, pain, nipple necrosis, asymmetry, poor cosmetic results, and need for further surgery were discussed. The patient had ample opportunity to have her questions answered to her satisfaction.  DESCRIPTION OF PROCEDURE  Patient was brought into the operating room and placed in a supine position.  SCDs were placed and appropriate padding was performed.  Antibiotics were given. The patient underwent general anesthesia and the chest was prepped and draped in a sterile fashion.  A timeout was performed and all information was confirmed to be correct.  Right side: Preoperative markings were confirmed.  Incision lines were injected with 1% Xylocaine with epinephrine.  After waiting for vasoconstriction, the marked lines were incised.  A Wise-pattern superomedial breast reduction was performed by de-epithelializing the pedicle, using bovie to create the superomedial pedicle, and removing breast tissue from the superior, lateral, and inferior portions of the breast.  Care was taken to not undermine the breast pedicle.  Hemostasis was achieved.  The nipple was gently rotated into position and the soft tissue closed with 4-0 Monocryl.   The pocket was irrigated and hemostasis confirmed.  The deep tissues were approximated with 3-0 monocryl sutures and the skin was closed with deep dermal and subcuticular 4-0 Monocryl sutures.  The nipple and skin flaps had good capillary refill at the end of the procedure.    Left side: Preoperative markings were confirmed.  Incision lines were injected with 1% Xylocaine with epinephrine.  After waiting for vasoconstriction, the marked lines were incised.  A Wise-pattern superomedial breast reduction was performed by de-epithelializing the pedicle, using bovie to create the superomedial pedicle, and removing breast tissue from the superior, lateral, and inferior portions of the breast.  Care was taken to not undermine the breast pedicle. Hemostasis was achieved.  The nipple was gently rotated into position and the soft tissue was closed with 4-0 Monocryl.  The patient was sat upright and size and shape symmetry was confirmed.  The pocket was irrigated and hemostasis confirmed.  The deep tissues were approximated with 3-0 monocryl sutures and the skin was closed with deep dermal and subcuticular 4-0 Monocryl sutures.  Dermabond was applied.  A breast binder and ABDs were placed.  The nipple and skin flaps had good capillary refill at the end of the procedure.  The areola was slightly dark so nitropaste added.  The RN first assistant assisted throughout the case.  The assistant was essential in retraction and counter traction when needed to make the case progress smoothly.  This retraction and assistance made it possible to see the tissue plans for the procedure.  The assistance was needed for blood control, tissue re-approximation and assisted with closure of the incision site. The  patient tolerated the procedure well. The patient was allowed to wake from anesthesia and taken to the recovery room  in satisfactory condition

## 2018-08-12 NOTE — Transfer of Care (Signed)
Immediate Anesthesia Transfer of Care Note  Patient: Shari Booth  Procedure(s) Performed: BILATERAL MAMMARY REDUCTION  (BREAST) (Bilateral Breast)  Patient Location: PACU  Anesthesia Type:General  Level of Consciousness: awake, alert  and oriented  Airway & Oxygen Therapy: Patient Spontanous Breathing and Patient connected to face mask oxygen  Post-op Assessment: Report given to RN and Post -op Vital signs reviewed and stable  Post vital signs: Reviewed and stable  Last Vitals:  Vitals Value Taken Time  BP    Temp    Pulse 112 08/12/2018 11:50 AM  Resp 21 08/12/2018 11:51 AM  SpO2 100 % 08/12/2018 11:50 AM  Vitals shown include unvalidated device data.  Last Pain:  Vitals:   08/12/18 0748  TempSrc: Oral  PainSc: 0-No pain         Complications: No apparent anesthesia complications

## 2018-08-12 NOTE — Interval H&P Note (Signed)
History and Physical Interval Note:  08/12/2018 8:14 AM  Shari Booth  has presented today for surgery, with the diagnosis of Chronic bilateral thoracic back pain; Neck pain; Symptomatic mammary hypertrophy  The various methods of treatment have been discussed with the patient and family. After consideration of risks, benefits and other options for treatment, the patient has consented to  Procedure(s): BILATERAL MAMMARY REDUCTION  (BREAST) (Bilateral) as a surgical intervention .  The patient's history has been reviewed, patient examined, no change in status, stable for surgery.  I have reviewed the patient's chart and labs.  Questions were answered to the patient's satisfaction.     [name]

## 2018-08-12 NOTE — Discharge Instructions (Addendum)
Post Anesthesia Home Care Instructions  Activity: Get plenty of rest for the remainder of the day. A responsible individual must stay with you for 24 hours following the procedure.  For the next 24 hours, DO NOT: -Drive a car -Paediatric nurse -Drink alcoholic beverages -Take any medication unless instructed by your physician -Make any legal decisions or sign important papers.  Meals: Start with liquid foods such as gelatin or soup. Progress to regular foods as tolerated. Avoid greasy, spicy, heavy foods. If nausea and/or vomiting occur, drink only clear liquids until the nausea and/or vomiting subsides. Call your physician if vomiting continues.  Special Instructions/Symptoms: Your throat may feel dry or sore from the anesthesia or the breathing tube placed in your throat during surgery. If this causes discomfort, gargle with warm salt water. The discomfort should disappear within 24 hours.  If you had a scopolamine patch placed behind your ear for the management of post- operative nausea and/or vomiting:  1. The medication in the patch is effective for 72 hours, after which it should be removed.  Wrap patch in a tissue and discard in the trash. Wash hands thoroughly with soap and water. 2. You may remove the patch earlier than 72 hours if you experience unpleasant side effects which may include dry mouth, dizziness or visual disturbances. 3. Avoid touching the patch. Wash your hands with soap and water after contact with the patch.   INSTRUCTIONS FOR AFTER SURGERY   You are having surgery.  You will likely have some questions about what to expect following your operation.  The following information will help you and your family understand what to expect when you are discharged from the hospital.  Following these guidelines will help ensure a smooth recovery and reduce risks of complications.   Postoperative instructions include information on: diet, wound care, medications and  physical activity.  AFTER SURGERY Expect to go home after the procedure.  In some cases, you may need to spend one night in the hospital for observation.  DIET This surgery does not require a specific diet.  However, I have to mention that the healthier you eat the better your body can start healing. It is important to increasing your protein intake.  This means limiting the foods with sugar and carbohydrates.  Focus on vegetables and some meat.  If you have any liposuction during your procedure be sure to drink water.  If your urine is bright yellow, then it is concentrated, and you need to drink more water.  As a general rule after surgery, you should have 8 ounces of water every hour while awake.  If you find you are persistently nauseated or unable to take in liquids let us know.  NO TOBACCO USE or EXPOSURE.  This will slow your healing process and increase the risk of a wound.  WOUND CARE You can shower the day after surgery if you don't have a drain.  Use fragrance free soap.  Dial, [place] and [place] are usually mild on the skin. If you have a drain clean with baby wipes until the drain is removed.  If you have steri-strips / tape directly attached to your skin leave them in place. It is OK to get these wet.  No baths, pools or hot tubs for two weeks. We close your incision to leave the smallest and best-looking scar. No ointment or creams on your incisions until given the go ahead.  Especially not Neosporin (Too many skin reactions with this one).  A  few weeks after surgery you can use Mederma and start massaging the scar. We ask you to wear your binder or sports bra for the first 6 weeks around the clock, including while sleeping. This provides added comfort and helps reduce the fluid accumulation at the surgery site.  Use Nitroglycerin Ointment 1/2 inch to each breast two times daily.   ACTIVITY No heavy lifting until cleared by the doctor.  It is OK to walk and climb stairs. In fact, moving  your legs is very important to decrease your risk of a blood clot.  It will also help keep you from getting deconditioned.  Every 1 to 2 hours get up and walk for 5 minutes. This will help with a quicker recovery back to normal.  Let pain be your guide so you don't do too much.  NO, you cannot do the spring cleaning and don't plan on taking care of anyone else.  This is your time for TLC.  You will be more comfortable if you sleep and rest with your head elevated either with a few pillows under you or in a recliner.  No stomach sleeping for a few months.  WORK Everyone returns to work at different times. As a rough guide, most people take at least 1 - 2 weeks off prior to returning to work. If you need documentation for your job, bring the forms to your postoperative follow up visit.  DRIVING Arrange for someone to bring you home from the hospital.  You may be able to drive a few days after surgery but not while taking any narcotics or valium.  BOWEL MOVEMENTS Constipation can occur after anesthesia and while taking pain medication.  It is important to stay ahead for your comfort.  We recommend taking Milk of Magnesia (2 tablespoons; twice a day) while taking the pain pills.  SEROMA This is fluid your body tried to put in the surgical site.  This is normal but if it creates tight skinny skin let us know.  It usually decreases in a few weeks.  WHEN TO CALL Call your surgeon's office if any of the following occur:  Fever 101 degrees F or greater  Excessive bleeding or fluid from the incision site.  Pain that increases over time without aid from the medications  Redness, warmth, or pus draining from incision sites  Persistent nausea or inability to take in liquids  Severe misshapen area that underwent the operation.

## 2018-08-12 NOTE — Anesthesia Procedure Notes (Signed)

## 2018-08-12 NOTE — Anesthesia Postprocedure Evaluation (Signed)
Anesthesia Post Note  Patient: Shari Booth  Procedure(s) Performed: BILATERAL MAMMARY REDUCTION  (BREAST) (Bilateral Breast)     Patient location during evaluation: PACU Anesthesia Type: General Level of consciousness: sedated and patient cooperative Pain management: pain level controlled Vital Signs Assessment: post-procedure vital signs reviewed and stable Respiratory status: spontaneous breathing Cardiovascular status: stable Anesthetic complications: no    Last Vitals:  Vitals:   08/12/18 1230 08/12/18 1300  BP: (!) 96/48 (!) 99/46  Pulse: (!) 102 (!) 102  Resp: 18 20  Temp:  36.8 C  SpO2: 97% 100%    Last Pain:  Vitals:   08/12/18 1315  TempSrc:   PainSc: [place]

## 2018-08-13 ENCOUNTER — Encounter (HOSPITAL_BASED_OUTPATIENT_CLINIC_OR_DEPARTMENT_OTHER): Payer: Self-pay | Admitting: Plastic Surgery

## 2018-08-13 MED ORDER — OXYCODONE-ACETAMINOPHEN 7.5-325 MG PO TABS: 1.0000 | ORAL_TABLET | Freq: Four times a day (QID) | ORAL | 0 refills | Status: AC | PRN

## 2018-08-13 NOTE — Addendum Note (Signed)
Addended by: Wallace Going on: 08/13/2018 06:04 PM   Modules accepted: Orders

## 2018-08-18 ENCOUNTER — Ambulatory Visit (INDEPENDENT_AMBULATORY_CARE_PROVIDER_SITE_OTHER): Payer: 59 | Admitting: Physician Assistant

## 2018-08-18 ENCOUNTER — Encounter: Payer: Self-pay | Admitting: Physician Assistant

## 2018-08-18 VITALS — BP 100/78 | HR 91 | Ht 64.5 in | Wt 183.0 lb

## 2018-08-18 DIAGNOSIS — M546 Pain in thoracic spine: Secondary | ICD-10-CM

## 2018-08-18 DIAGNOSIS — Z9889 Other specified postprocedural states: Secondary | ICD-10-CM

## 2018-08-18 DIAGNOSIS — N62 Hypertrophy of breast: Secondary | ICD-10-CM

## 2018-08-18 DIAGNOSIS — G8929 Other chronic pain: Secondary | ICD-10-CM

## 2018-08-18 MED ORDER — CLOBETASOL PROPIONATE 0.05 % EX CREA: 1.0000 "application " | TOPICAL_CREAM | Freq: Two times a day (BID) | CUTANEOUS | 0 refills | Status: DC

## 2018-08-18 NOTE — Progress Notes (Signed)
  Subjective:     Patient ID: Shari Booth, female   DOB: 01/15/1971, 47 y.o.   MRN: [id number]  HPI Is a pleasant 47 year old white female patient presents to clinic status post 1 week after breast reduction.  Patient already notes decreased shoulder and back pain.  Patient reports pain is improving.  Today she is only taking Motrin and he feels very comfortable.  Patient does have some swelling and a rash. She says she has used an OTC cortisone cream which help the itching but the rash did not go away.  Review of Systems  Constitutional: Negative.   Respiratory: Negative.   Cardiovascular: Negative.   Gastrointestinal: Negative.   Skin: Positive for rash and wound.  Neurological: Negative.   Psychiatric/Behavioral: Negative.        Objective:   Physical Exam Constitutional:      Appearance: Normal appearance.  HENT:     Head: Normocephalic.  Pulmonary:     Effort: Pulmonary effort is normal.  Abdominal:     Palpations: Abdomen is soft.  Skin:    General: Skin is warm and dry.  Neurological:     Mental Status: She is alert and oriented to person, place, and time.  Psychiatric:        Mood and Affect: Mood normal.        Behavior: Behavior normal.        Thought Content: Thought content normal.        Judgment: Judgment normal.        Assessment:     S/p B/L Breast reduction    Plan:     Today we did drain 35 cc of serosanguineous fluid from the right breast and 55 cc from the left. Dr. Marla Roe did send in  Some clobetasol to treat the rash Patient said she was going to purchase a sports bra. We advised her to get one that was snug but not too tight We advised the pt to continue a high protein, low card and sugar diet She will return to clinic in 2 weeks

## 2018-08-20 ENCOUNTER — Telehealth: Payer: Self-pay

## 2018-08-20 NOTE — Telephone Encounter (Signed)
Telephone call from patient regarding an increase in pain & pressure in bilateral breasts. She denies any fever & no redness, & no drainage noted. Pt has finished her round of antibiotics. Patient is seeking advice regarding going on family trip today & will return tomorrow. I advised her to use moist/warm compress, and continue sports bra/compression. I instructed her to minimize arm/shoulder activity & to rest periodically during trip. We scheduled her a f/u appointment with Dr. Marla Roe for Ludwig Clarks. 08/21/18 @ 3:30pm Patient was instructed to call for any concerns or changes. She agrees with plan of care. [employer]

## 2018-08-21 ENCOUNTER — Encounter: Payer: Self-pay | Admitting: Plastic Surgery

## 2018-08-25 ENCOUNTER — Ambulatory Visit (INDEPENDENT_AMBULATORY_CARE_PROVIDER_SITE_OTHER): Payer: 59 | Admitting: Plastic Surgery

## 2018-08-25 ENCOUNTER — Encounter: Payer: Self-pay | Admitting: Plastic Surgery

## 2018-08-25 VITALS — BP 105/72 | HR 87 | Temp 98.1°F | Ht 64.5 in | Wt 183.0 lb

## 2018-08-25 DIAGNOSIS — N62 Hypertrophy of breast: Secondary | ICD-10-CM

## 2018-08-25 DIAGNOSIS — G8929 Other chronic pain: Secondary | ICD-10-CM

## 2018-08-25 DIAGNOSIS — M542 Cervicalgia: Secondary | ICD-10-CM

## 2018-08-25 DIAGNOSIS — M546 Pain in thoracic spine: Secondary | ICD-10-CM

## 2018-08-25 NOTE — Progress Notes (Signed)
   Subjective:    Patient ID: Shari Booth, female    DOB: Jan 12, 1971, 47 y.o.   MRN: [id number]  The patient is a 47 year old female here for follow-up on her bilateral breast reduction.  Overall she is feeling good.  She has noticed a little bit of fullness but nothing more compared to Friday.  She just wanted to be sure everything was okay.  She has a little blister on the left lower lateral breast from the Steri-Strips.  No increase in swelling.  The skin looks good.  There is no redness or sign of infection.  There might be a little bit of fluid in there.  I do not see fluid wave.  I feel comfortable giving it more time and not putting a needle in today.  The patient agrees.  She has increased her activity lately and that may have created a little increased irritation to the breast surgical site.     Review of Systems  Constitutional: Positive for activity change.  HENT: Negative.   Eyes: Negative.   Respiratory: Negative.   Gastrointestinal: Negative.   Genitourinary: Negative.   Musculoskeletal: Negative.   Skin: Negative.        Objective:   Physical Exam Vitals signs and nursing note reviewed.  Constitutional:      Appearance: Normal appearance.  HENT:     Head: Normocephalic.  Cardiovascular:     Rate and Rhythm: Normal rate.  Pulmonary:     Effort: Pulmonary effort is normal.  Neurological:     Mental Status: She is alert.  Psychiatric:        Mood and Affect: Mood normal.        Thought Content: Thought content normal.        Judgment: Judgment normal.        Assessment & Plan:  Symptomatic mammary hypertrophy  Neck pain  Chronic bilateral thoracic back pain  Continue with sports bra and compression.  Increase activity slowly.  Definitely up walking and moving.  No real repetitive movements as of yet.  I would like to see her back in 2 weeks sooner if needed and she is aware.

## 2018-09-04 ENCOUNTER — Ambulatory Visit (INDEPENDENT_AMBULATORY_CARE_PROVIDER_SITE_OTHER): Payer: 59 | Admitting: Plastic Surgery

## 2018-09-04 ENCOUNTER — Encounter: Payer: Self-pay | Admitting: Plastic Surgery

## 2018-09-04 ENCOUNTER — Ambulatory Visit: Payer: Self-pay | Admitting: Plastic Surgery

## 2018-09-04 VITALS — BP 115/79 | HR 87 | Temp 98.5°F | Ht 64.5 in | Wt 183.0 lb

## 2018-09-04 DIAGNOSIS — N62 Hypertrophy of breast: Secondary | ICD-10-CM

## 2018-09-04 DIAGNOSIS — M546 Pain in thoracic spine: Secondary | ICD-10-CM

## 2018-09-04 DIAGNOSIS — M542 Cervicalgia: Secondary | ICD-10-CM

## 2018-09-04 DIAGNOSIS — G8929 Other chronic pain: Secondary | ICD-10-CM

## 2018-09-04 NOTE — Progress Notes (Addendum)
   Subjective:    Patient ID: Shari Booth, female    DOB: [birth year], 48 y.o.   MRN: [id number]  The patient is a 48 year old female here for follow-up on her bilateral breast reduction.  She is very pleased with her results.  There is no sign of a hematoma or seroma.  All incisions are healing well.  Nipple areole have good color.     Review of Systems  Constitutional: Negative.  Negative for activity change and appetite change.  HENT: Negative.   Eyes: Negative.   Respiratory: Negative.   Gastrointestinal: Negative.   Genitourinary: Negative.   Skin: Negative.  Negative for color change and wound.       Objective:   Physical Exam Vitals signs and nursing note reviewed.  Constitutional:      Appearance: Normal appearance.  HENT:     Head: Normocephalic.  Cardiovascular:     Rate and Rhythm: Normal rate.  Pulmonary:     Effort: Pulmonary effort is normal.  Neurological:     Mental Status: She is alert.  Psychiatric:        Mood and Affect: Mood normal.        Thought Content: Thought content normal.        Judgment: Judgment normal.       Assessment & Plan:  Symptomatic mammary hypertrophy  Neck pain  Chronic bilateral thoracic back pain Continue with sports bra for 6 weeks postop.  Follow-up as needed.

## 2018-09-08 DIAGNOSIS — G43711 Chronic migraine without aura, intractable, with status migrainosus: Secondary | ICD-10-CM | POA: Diagnosis not present

## 2018-09-11 ENCOUNTER — Ambulatory Visit (INDEPENDENT_AMBULATORY_CARE_PROVIDER_SITE_OTHER): Payer: 59 | Admitting: Neurology

## 2018-09-11 ENCOUNTER — Telehealth: Payer: Self-pay | Admitting: Neurology

## 2018-09-11 DIAGNOSIS — G43711 Chronic migraine without aura, intractable, with status migrainosus: Secondary | ICD-10-CM | POA: Diagnosis not present

## 2018-09-11 NOTE — Telephone Encounter (Signed)
Patient needs Botox Follow up . Patient is ok with you calling her. Botox for April .

## 2018-09-11 NOTE — Progress Notes (Signed)
Consent Form Botulism Toxin Injection For Chronic Migraine    Interval history 09/11/2018:  She has done tremendously well with NO MIGRAINES since last botox injection. Baseline headache frequency is daily headaches with 15 migrainous.  100% reduction in frequency and severity of headaches and migraines. +masseters, +eyes, +temples, +lateral corrugators, +levator scapulae, +spock brows, + behind ear auricular temporal. Also she has large breasts, we have discussed in the past a breast reduction may help with her cervicalgia, happy to document this for her should she need it.  Reviewed orally with patient, additionally signature is on file:  Botulism toxin has been approved by the Federal drug administration for treatment of chronic migraine. Botulism toxin does not cure chronic migraine and it may not be effective in some patients.  The administration of botulism toxin is accomplished by injecting a small amount of toxin into the muscles of the neck and head. Dosage must be titrated for each individual. Any benefits resulting from botulism toxin tend to wear off after 3 months with a repeat injection required if benefit is to be maintained. Injections are usually done every 3-4 months with maximum effect peak achieved by about 2 or 3 weeks. Botulism toxin is expensive and you should be sure of what costs you will incur resulting from the injection.  The side effects of botulism toxin use for chronic migraine may include:   -Transient, and usually mild, facial weakness with facial injections  -Transient, and usually mild, head or neck weakness with head/neck injections  -Reduction or loss of forehead facial animation due to forehead muscle weakness  -Eyelid drooping  -Dry eye  -Pain at the site of injection or bruising at the site of injection  -Double vision  -Potential unknown long term risks  Contraindications: You should not have Botox if you are pregnant, nursing, allergic to albumin,  have an infection, skin condition, or muscle weakness at the site of the injection, or have myasthenia gravis, Lambert-Eaton syndrome, or ALS.  It is also possible that as with any injection, there may be an allergic reaction or no effect from the medication. Reduced effectiveness after repeated injections is sometimes seen and rarely infection at the injection site may occur. All care will be taken to prevent these side effects. If therapy is given over a long time, atrophy and wasting in the muscle injected may occur. Occasionally the patient's become refractory to treatment because they develop antibodies to the toxin. In this event, therapy needs to be modified.  I have read the above information and consent to the administration of botulism toxin.    BOTOX PROCEDURE NOTE FOR MIGRAINE HEADACHE    Contraindications and precautions discussed with patient(above). Aseptic procedure was observed and patient tolerated procedure. Procedure performed by Dr. Georgia Dom  The condition has existed for more than 6 months, and pt does not have a diagnosis of ALS, Myasthenia Gravis or Lambert-Eaton Syndrome.  Risks and benefits of injections discussed and pt agrees to proceed with the procedure.  Written consent obtained  These injections are medically necessary. Pt  receives good benefits from these injections. These injections do not cause sedations or hallucinations which the oral therapies may cause.  Description of procedure:  The patient was placed in a sitting position. The standard protocol was used for Botox as follows, with 5 units of Botox injected at each site:   -Procerus muscle, midline injection  -Corrugator muscle, bilateral injection  -Frontalis muscle, bilateral injection, with 2 sites each side, medial injection  was performed in the upper one third of the frontalis muscle, in the region vertical from the medial inferior edge of the superior orbital rim. The lateral injection was  again in the upper one third of the forehead vertically above the lateral limbus of the cornea, 1.5 cm lateral to the medial injection site.  - Levator Scapulae: 5 units bilaterally  -Temporalis muscle injection, 5 sites, bilaterally. The first injection was 3 cm above the tragus of the ear, second injection site was 1.5 cm to 3 cm up from the first injection site in line with the tragus of the ear. The third injection site was 1.5-3 cm forward between the first 2 injection sites. The fourth injection site was 1.5 cm posterior to the second injection site. 5th site laterally in the temporalis  muscleat the level of the outer canthus.  - Patient feels her clenching is a trigger for headaches. +5 units masseter bilaterally   - Patient feels the migraines are centered around the eyes +5 units bilaterally at the outer canthus in the orbicularis occuli  -Occipitalis muscle injection, 3 sites, bilaterally. The first injection was done one half way between the occipital protuberance and the tip of the mastoid process behind the ear. The second injection site was done lateral and superior to the first, 1 fingerbreadth from the first injection. The third injection site was 1 fingerbreadth superiorly and medially from the first injection site.  -Cervical paraspinal muscle injection, 2 sites, bilateral knee first injection site was 1 cm from the midline of the cervical spine, 3 cm inferior to the lower border of the occipital protuberance. The second injection site was 1.5 cm superiorly and laterally to the first injection site.  -Trapezius muscle injection was performed at 3 sites, bilaterally. The first injection site was in the upper trapezius muscle halfway between the inflection point of the neck, and the acromion. The second injection site was one half way between the acromion and the first injection site. The third injection was done between the first injection site and the inflection point of the  neck.   Will return for repeat injection in 3 months.   A 200 unit sof Botox was used, any Botox not injected was wasted. The patient tolerated the procedure well, there were no complications of the above procedure.

## 2018-09-11 NOTE — Progress Notes (Signed)
Botox- 100 units x 2 vials Lot: [id number] Expiration: 12/2020 NDC: 0023-1145-01  Bacteriostatic 0.9% Sodium Chloride- 4mL total Lot: [id number] Expiration: 05/27/2019 NDC: 0409-1966-02  Dx: G43.711 S/P  

## 2018-09-15 NOTE — Telephone Encounter (Signed)
I called to schedule the patient but she did not answer. I went ahead and made an apt for her 3 month injection. When she calls back, please make her aware of this apt date and time.  °

## 2018-10-19 DIAGNOSIS — D225 Melanocytic nevi of trunk: Secondary | ICD-10-CM | POA: Diagnosis not present

## 2018-10-19 DIAGNOSIS — Z1283 Encounter for screening for malignant neoplasm of skin: Secondary | ICD-10-CM | POA: Diagnosis not present

## 2018-10-19 DIAGNOSIS — L738 Other specified follicular disorders: Secondary | ICD-10-CM | POA: Diagnosis not present

## 2018-10-28 ENCOUNTER — Telehealth: Payer: Self-pay | Admitting: Neurology

## 2018-10-28 NOTE — Telephone Encounter (Signed)
Pt is asking for a call to r/s her Botox

## 2018-10-29 NOTE — Telephone Encounter (Signed)
error 

## 2018-11-02 NOTE — Telephone Encounter (Signed)
I called and rescheduled the patient.

## 2018-11-18 ENCOUNTER — Other Ambulatory Visit: Payer: Self-pay | Admitting: Family Medicine

## 2018-11-18 DIAGNOSIS — Z1231 Encounter for screening mammogram for malignant neoplasm of breast: Secondary | ICD-10-CM

## 2018-12-09 ENCOUNTER — Telehealth: Payer: Self-pay | Admitting: Neurology

## 2018-12-11 ENCOUNTER — Ambulatory Visit: Payer: 59 | Admitting: Neurology

## 2018-12-18 ENCOUNTER — Ambulatory Visit: Payer: 59 | Admitting: Neurology

## 2018-12-28 ENCOUNTER — Other Ambulatory Visit: Payer: Self-pay | Admitting: *Deleted

## 2018-12-28 MED ORDER — FREMANEZUMAB-VFRM 225 MG/1.5ML ~~LOC~~ SOSY: 225.0000 mg | PREFILLED_SYRINGE | SUBCUTANEOUS | 11 refills | Status: DC

## 2018-12-28 NOTE — Telephone Encounter (Signed)
I spoke with the patient and scheduled her injection. I confirmed she had no had any exposure, fever, or new symptos. I also informed her to wear a mask and gloves into the office for her visit.   She would like a refill on her Ajovy.

## 2018-12-28 NOTE — Telephone Encounter (Signed)
Refill of Ajovy sent to [facility].

## 2019-01-04 DIAGNOSIS — G43711 Chronic migraine without aura, intractable, with status migrainosus: Secondary | ICD-10-CM | POA: Diagnosis not present

## 2019-01-05 ENCOUNTER — Ambulatory Visit (INDEPENDENT_AMBULATORY_CARE_PROVIDER_SITE_OTHER): Payer: 59 | Admitting: Neurology

## 2019-01-05 ENCOUNTER — Other Ambulatory Visit: Payer: Self-pay

## 2019-01-05 DIAGNOSIS — G43711 Chronic migraine without aura, intractable, with status migrainosus: Secondary | ICD-10-CM

## 2019-01-05 MED ORDER — FREMANEZUMAB-VFRM 225 MG/1.5ML ~~LOC~~ SOSY: 225.0000 mg | PREFILLED_SYRINGE | SUBCUTANEOUS | 11 refills | Status: DC

## 2019-01-05 NOTE — Progress Notes (Signed)
Consent Form Botulism Toxin Injection For Chronic Migraine    Interval history 5/12//2020:  She has done tremendously well with NO MIGRAINES since last botox injection. She does feel her headaches are starting to come back since we are delayed due to covid, we are several weeks late for this injection. Baseline headache frequency is daily headaches with 15 migrainous.  100% reduction in frequency and severity of headaches and migraines. +masseters, +eyes, +temples, +lateral corrugators, +levator scapulae, +spock brows, + behind ear auricular temporal. Also she has large breasts, we have discussed in the past a breast reduction may help with her cervicalgia, happy to document this for her should she need it.  Meds ordered this encounter  Medications  . Fremanezumab-vfrm (AJOVY) 225 MG/1.5ML SOSY    Sig: Inject 225 mg into the skin every 30 (thirty) days.    Dispense:  1 Syringe    Refill:  11     Reviewed orally with patient, additionally signature is on file:  Botulism toxin has been approved by the Federal drug administration for treatment of chronic migraine. Botulism toxin does not cure chronic migraine and it may not be effective in some patients.  The administration of botulism toxin is accomplished by injecting a small amount of toxin into the muscles of the neck and head. Dosage must be titrated for each individual. Any benefits resulting from botulism toxin tend to wear off after 3 months with a repeat injection required if benefit is to be maintained. Injections are usually done every 3-4 months with maximum effect peak achieved by about 2 or 3 weeks. Botulism toxin is expensive and you should be sure of what costs you will incur resulting from the injection.  The side effects of botulism toxin use for chronic migraine may include:   -Transient, and usually mild, facial weakness with facial injections  -Transient, and usually mild, head or neck weakness with head/neck  injections  -Reduction or loss of forehead facial animation due to forehead muscle weakness  -Eyelid drooping  -Dry eye  -Pain at the site of injection or bruising at the site of injection  -Double vision  -Potential unknown long term risks  Contraindications: You should not have Botox if you are pregnant, nursing, allergic to albumin, have an infection, skin condition, or muscle weakness at the site of the injection, or have myasthenia gravis, Lambert-Eaton syndrome, or ALS.  It is also possible that as with any injection, there may be an allergic reaction or no effect from the medication. Reduced effectiveness after repeated injections is sometimes seen and rarely infection at the injection site may occur. All care will be taken to prevent these side effects. If therapy is given over a long time, atrophy and wasting in the muscle injected may occur. Occasionally the patient's become refractory to treatment because they develop antibodies to the toxin. In this event, therapy needs to be modified.  I have read the above information and consent to the administration of botulism toxin.    BOTOX PROCEDURE NOTE FOR MIGRAINE HEADACHE    Contraindications and precautions discussed with patient(above). Aseptic procedure was observed and patient tolerated procedure. Procedure performed by Dr. Georgia Dom  The condition has existed for more than 6 months, and pt does not have a diagnosis of ALS, Myasthenia Gravis or Lambert-Eaton Syndrome.  Risks and benefits of injections discussed and pt agrees to proceed with the procedure.  Written consent obtained  These injections are medically necessary. Pt  receives good benefits from these  injections. These injections do not cause sedations or hallucinations which the oral therapies may cause.  Description of procedure:  The patient was placed in a sitting position. The standard protocol was used for Botox as follows, with 5 units of Botox injected at each  site:   -Procerus muscle, midline injection  -Corrugator muscle, bilateral injection  -Frontalis muscle, bilateral injection, with 2 sites each side, medial injection was performed in the upper one third of the frontalis muscle, in the region vertical from the medial inferior edge of the superior orbital rim. The lateral injection was again in the upper one third of the forehead vertically above the lateral limbus of the cornea, 1.5 cm lateral to the medial injection site.  - Levator Scapulae: 5 units bilaterally  -Temporalis muscle injection, 5 sites, bilaterally. The first injection was 3 cm above the tragus of the ear, second injection site was 1.5 cm to 3 cm up from the first injection site in line with the tragus of the ear. The third injection site was 1.5-3 cm forward between the first 2 injection sites. The fourth injection site was 1.5 cm posterior to the second injection site. 5th site laterally in the temporalis  muscleat the level of the outer canthus.  - Patient feels her clenching is a trigger for headaches. +5 units masseter bilaterally   - Patient feels the migraines are centered around the eyes +5 units bilaterally at the outer canthus in the orbicularis occuli  -Occipitalis muscle injection, 3 sites, bilaterally. The first injection was done one half way between the occipital protuberance and the tip of the mastoid process behind the ear. The second injection site was done lateral and superior to the first, 1 fingerbreadth from the first injection. The third injection site was 1 fingerbreadth superiorly and medially from the first injection site.  -Cervical paraspinal muscle injection, 2 sites, bilateral knee first injection site was 1 cm from the midline of the cervical spine, 3 cm inferior to the lower border of the occipital protuberance. The second injection site was 1.5 cm superiorly and laterally to the first injection site.  -Trapezius muscle injection was performed at 3  sites, bilaterally. The first injection site was in the upper trapezius muscle halfway between the inflection point of the neck, and the acromion. The second injection site was one half way between the acromion and the first injection site. The third injection was done between the first injection site and the inflection point of the neck.   Will return for repeat injection in 3 months.   A 200 unit sof Botox was used, any Botox not injected was wasted. The patient tolerated the procedure well, there were no complications of the above procedure.

## 2019-01-05 NOTE — Progress Notes (Signed)
Botox- 100 units x 2 vials Lot: [id number] Expiration: 06/2021 NDC: 6015-6153-79  Bacteriostatic 0.9% Sodium Chloride- 36mL total Lot: [id number] Expiration: 05/27/2019 NDC: 4709-2957-47  Dx: B40.370 S/P

## 2019-01-07 ENCOUNTER — Telehealth: Payer: Self-pay | Admitting: *Deleted

## 2019-01-07 NOTE — Telephone Encounter (Signed)
Ajovy 225 mg PA completed on Cover My Meds. KEY: [id number]. Awaiting [facility] determination.

## 2019-01-12 ENCOUNTER — Encounter: Payer: Self-pay | Admitting: *Deleted

## 2019-01-12 NOTE — Telephone Encounter (Signed)
Use savings card thanks

## 2019-01-12 NOTE — Telephone Encounter (Addendum)
Received denial from [facility] d/t Ajovy not being a covered benefit and is excluded from coverage in accordance with the terms and conditions of pt's plan benefit. If we should choose to appeal, fax to [phone].   Reference # [id number]

## 2019-01-12 NOTE — Telephone Encounter (Signed)
Sent pt a mychart message. 

## 2019-01-15 ENCOUNTER — Ambulatory Visit: Payer: Self-pay

## 2019-01-30 ENCOUNTER — Other Ambulatory Visit: Payer: Self-pay

## 2019-01-30 ENCOUNTER — Ambulatory Visit
Admission: RE | Admit: 2019-01-30 | Discharge: 2019-01-30 | Disposition: A | Discharge status: Home / Self Care | Payer: 59 | Source: Ambulatory Visit | Attending: Family Medicine | Admitting: Family Medicine

## 2019-01-30 DIAGNOSIS — Z1231 Encounter for screening mammogram for malignant neoplasm of breast: Secondary | ICD-10-CM

## 2019-04-08 ENCOUNTER — Ambulatory Visit: Payer: 59 | Admitting: Neurology

## 2019-04-19 ENCOUNTER — Telehealth: Payer: Self-pay | Admitting: Neurology

## 2019-04-19 NOTE — Telephone Encounter (Signed)
Pending patient consent. I have called the patient twice and left her a VM. The pharmacy has also called her to make her aware.

## 2019-04-20 ENCOUNTER — Ambulatory Visit (INDEPENDENT_AMBULATORY_CARE_PROVIDER_SITE_OTHER): Payer: 59 | Admitting: Neurology

## 2019-04-20 ENCOUNTER — Telehealth: Payer: Self-pay | Admitting: *Deleted

## 2019-04-20 ENCOUNTER — Other Ambulatory Visit: Payer: Self-pay

## 2019-04-20 VITALS — Temp 98.0°F

## 2019-04-20 DIAGNOSIS — G43711 Chronic migraine without aura, intractable, with status migrainosus: Secondary | ICD-10-CM | POA: Diagnosis not present

## 2019-04-20 MED ORDER — NURTEC 75 MG PO TBDP: 75.0000 mg | ORAL_TABLET | Freq: Every day | ORAL | 6 refills | Status: DC | PRN

## 2019-04-20 MED ORDER — AJOVY 225 MG/1.5ML ~~LOC~~ SOAJ: 225.0000 mg | SUBCUTANEOUS | 11 refills | Status: DC

## 2019-04-20 NOTE — Progress Notes (Signed)
Botox- 100 units x 2 vials Lot: [id number] Expiration: 10/2021 NDC: 0023-1145-01  Bacteriostatic 0.9% Sodium Chloride- 4mL total Lot: [id number] Expiration: 05/27/2019 NDC: 0409-1966-02  Dx: G43.711 S/P  

## 2019-04-20 NOTE — Progress Notes (Signed)
Consent Form Botulism Toxin Injection For Chronic Migraine    Interval history 04/20/2019:  She has done tremendously well with NO MIGRAINES since last botox injection. She does feel her headaches are starting to come back since we are delayed due to covid, we are several weeks late for this injection. Baseline headache frequency is daily headaches with 15 migrainous.  100% reduction in frequency and severity of headaches and migraines. +masseters, +eyes, +temples, +lateral corrugators, +levator scapulae, +spock brows, + behind ear auricular temporal. Also she has large breasts, we have discussed in the past a breast reduction may help with her cervicalgia, happy to document this for her should she need it.  She has stopped taking the Ajovy and still doing well. We can push out botox to 16 weeks and see how she does. Will prescribe Ajovy just in case. Also try nurtec.   Meds ordered this encounter  Medications   Fremanezumab-vfrm (AJOVY) 225 MG/1.5ML SOAJ    Sig: Inject 225 mg into the skin every 30 (thirty) days. Patient has copay card    Dispense:  1 pen    Refill:  11   Rimegepant Sulfate (NURTEC) 75 MG TBDP    Sig: Take 75 mg by mouth daily as needed. For migraines. Take as close to onset of migraine as possible. One daily maximum.    Dispense:  10 tablet    Refill:  6    Patient has copay card     Meds ordered this encounter  Medications   Fremanezumab-vfrm (AJOVY) 225 MG/1.5ML SOAJ    Sig: Inject 225 mg into the skin every 30 (thirty) days. Patient has copay card    Dispense:  1 pen    Refill:  11   Rimegepant Sulfate (NURTEC) 75 MG TBDP    Sig: Take 75 mg by mouth daily as needed. For migraines. Take as close to onset of migraine as possible. One daily maximum.    Dispense:  10 tablet    Refill:  6    Patient has copay card     Reviewed orally with patient, additionally signature is on file:  Botulism toxin has been approved by the Federal drug administration for  treatment of chronic migraine. Botulism toxin does not cure chronic migraine and it may not be effective in some patients.  The administration of botulism toxin is accomplished by injecting a small amount of toxin into the muscles of the neck and head. Dosage must be titrated for each individual. Any benefits resulting from botulism toxin tend to wear off after 3 months with a repeat injection required if benefit is to be maintained. Injections are usually done every 3-4 months with maximum effect peak achieved by about 2 or 3 weeks. Botulism toxin is expensive and you should be sure of what costs you will incur resulting from the injection.  The side effects of botulism toxin use for chronic migraine may include:   -Transient, and usually mild, facial weakness with facial injections  -Transient, and usually mild, head or neck weakness with head/neck injections  -Reduction or loss of forehead facial animation due to forehead muscle weakness  -Eyelid drooping  -Dry eye  -Pain at the site of injection or bruising at the site of injection  -Double vision  -Potential unknown long term risks  Contraindications: You should not have Botox if you are pregnant, nursing, allergic to albumin, have an infection, skin condition, or muscle weakness at the site of the injection, or have myasthenia gravis, Lambert-Eaton  syndrome, or ALS.  It is also possible that as with any injection, there may be an allergic reaction or no effect from the medication. Reduced effectiveness after repeated injections is sometimes seen and rarely infection at the injection site may occur. All care will be taken to prevent these side effects. If therapy is given over a long time, atrophy and wasting in the muscle injected may occur. Occasionally the patient's become refractory to treatment because they develop antibodies to the toxin. In this event, therapy needs to be modified.  I have read the above information and consent to the  administration of botulism toxin.    BOTOX PROCEDURE NOTE FOR MIGRAINE HEADACHE    Contraindications and precautions discussed with patient(above). Aseptic procedure was observed and patient tolerated procedure. Procedure performed by Dr. Georgia Dom  The condition has existed for more than 6 months, and pt does not have a diagnosis of ALS, Myasthenia Gravis or Lambert-Eaton Syndrome.  Risks and benefits of injections discussed and pt agrees to proceed with the procedure.  Written consent obtained  These injections are medically necessary. Pt  receives good benefits from these injections. These injections do not cause sedations or hallucinations which the oral therapies may cause.  Description of procedure:  The patient was placed in a sitting position. The standard protocol was used for Botox as follows, with 5 units of Botox injected at each site:   -Procerus muscle, midline injection  -Corrugator muscle, bilateral injection  -Frontalis muscle, bilateral injection, with 2 sites each side, medial injection was performed in the upper one third of the frontalis muscle, in the region vertical from the medial inferior edge of the superior orbital rim. The lateral injection was again in the upper one third of the forehead vertically above the lateral limbus of the cornea, 1.5 cm lateral to the medial injection site.  - Levator Scapulae: 5 units bilaterally  -Temporalis muscle injection, 5 sites, bilaterally. The first injection was 3 cm above the tragus of the ear, second injection site was 1.5 cm to 3 cm up from the first injection site in line with the tragus of the ear. The third injection site was 1.5-3 cm forward between the first 2 injection sites. The fourth injection site was 1.5 cm posterior to the second injection site. 5th site laterally in the temporalis  muscleat the level of the outer canthus.  - Patient feels her clenching is a trigger for headaches. +5 units masseter  bilaterally   - Patient feels the migraines are centered around the eyes +5 units bilaterally at the outer canthus in the orbicularis occuli  -Occipitalis muscle injection, 3 sites, bilaterally. The first injection was done one half way between the occipital protuberance and the tip of the mastoid process behind the ear. The second injection site was done lateral and superior to the first, 1 fingerbreadth from the first injection. The third injection site was 1 fingerbreadth superiorly and medially from the first injection site.  -Cervical paraspinal muscle injection, 2 sites, bilateral knee first injection site was 1 cm from the midline of the cervical spine, 3 cm inferior to the lower border of the occipital protuberance. The second injection site was 1.5 cm superiorly and laterally to the first injection site.  -Trapezius muscle injection was performed at 3 sites, bilaterally. The first injection site was in the upper trapezius muscle halfway between the inflection point of the neck, and the acromion. The second injection site was one half way between the acromion and  the first injection site. The third injection was done between the first injection site and the inflection point of the neck.   Will return for repeat injection in 3 months.   A 200 unit sof Botox was used, any Botox not injected was wasted. The patient tolerated the procedure well, there were no complications of the above procedure.

## 2019-04-20 NOTE — Telephone Encounter (Signed)
3 mo botox appt.

## 2019-04-20 NOTE — Patient Instructions (Signed)
[employer]: Patient drug information Access [employer] here. Copyright [phone] [employer] All rights reserved. (For additional information see "[employer]: Drug information") Brand Names: [place]  Nurtec  What is this drug used for?   It is used to treat migraine headaches.  What do I need to tell my doctor BEFORE I take this drug?   If you are allergic to this drug; any part of this drug; or any other drugs, foods, or substances. Tell your doctor about the allergy and what signs you had.   If you have any of these health problems: Kidney disease or liver disease.   If you take any drugs (prescription or OTC, natural products, vitamins) that must not be taken with this drug, like certain drugs that are used for HIV, infections, or seizures. There are many drugs that must not be taken with this drug.   This is not a list of all drugs or health problems that interact with this drug.   Tell your doctor and pharmacist about all of your drugs (prescription or OTC, natural products, vitamins) and health problems. You must check to make sure that it is safe for you to take this drug with all of your drugs and health problems. Do not start, stop, or change the dose of any drug without checking with your doctor.  What are some things I need to know or do while I take this drug?   Tell all of your health care providers that you take this drug. This includes your doctors, nurses, pharmacists, and dentists.   This drug is not meant to prevent or lower the number of migraine headaches you get.   Tell your doctor if you are pregnant, plan on getting pregnant, or are breast-feeding. You will need to talk about the benefits and risks to you and the baby.  What are some side effects that I need to call my doctor about right away?   WARNING/CAUTION: Even though it may be rare, some people may have very bad and sometimes deadly side effects when taking a drug. Tell your doctor or get medical help right  away if you have any of the following signs or symptoms that may be related to a very bad side effect:   Signs of an allergic reaction, like rash; hives; itching; red, swollen, blistered, or peeling skin with or without fever; wheezing; tightness in the chest or throat; trouble breathing, swallowing, or talking; unusual hoarseness; or swelling of the mouth, face, lips, tongue, or throat.  What are some other side effects of this drug?   All drugs may cause side effects. However, many people have no side effects or only have minor side effects. Call your doctor or get medical help if any of these side effects or any other side effects bother you or do not go away:   Upset stomach.   These are not all of the side effects that may occur. If you have questions about side effects, call your doctor. Call your doctor for medical advice about side effects.   You may report side effects to your national health agency.  How is this drug best taken?   Use this drug as ordered by your doctor. Read all information given to you. Follow all instructions closely.   Do not push the tablet out of the foil when opening. Use dry hands to take it from the foil. Place on your tongue and let it dissolve. Water is not needed. Do not swallow it whole. Do  not chew, break, or crush it.   If needed, you may place the tablet under the tongue.   Use right after opening.  What do I do if I miss a dose?   This drug is taken on an as needed basis. Do not take more often than told by the doctor.  How do I store and/or throw out this drug?   Store at room temperature in a dry place. Do not store in a bathroom.   Store in foil pouch until ready for use.   Keep all drugs in a safe place. Keep all drugs out of the reach of children and pets.   Throw away unused or expired drugs. Do not flush down a toilet or pour down a drain unless you are told to do so. Check with your pharmacist if you have questions about the best way to throw  out drugs. There may be drug take-back programs in your area.  General drug facts   If your symptoms or health problems do not get better or if they become worse, call your doctor.   Do not share your drugs with others and do not take anyone else's drugs.   Some drugs may have another patient information leaflet. If you have any questions about this drug, please talk with your doctor, nurse, pharmacist, or other health care provider.   If you think there has been an overdose, call your poison control center or get medical care right away. Be ready to tell or show what was taken, how much, and when it happened.

## 2019-04-21 ENCOUNTER — Telehealth: Payer: Self-pay | Admitting: *Deleted

## 2019-04-21 ENCOUNTER — Encounter: Payer: Self-pay | Admitting: *Deleted

## 2019-04-21 NOTE — Telephone Encounter (Signed)
Per CMM, Nurtec is denied: The requested medication and/or diagnosis are not a covered benefit and excluded from coverage in accordance with the terms and conditions of your plan benefit. Therefore, the request has been administratively denied. Will send patient my chart message and advise she use her savings card to get Rx filled.

## 2019-04-21 NOTE — Telephone Encounter (Signed)
Started Nurtec 75 mg tab PA on CMM, key: [id number]. Dx: [id number]. Failed: zonegran, gabapentin, imitrex, lexapro, propanolol, prednisone. Supporting office note from 05/05/2017 faxed to [facility]. Information sent to [facility].

## 2019-04-22 NOTE — Telephone Encounter (Signed)
I called the patient to schedule her next apt but she didn't answer, if she calls back please confirm this apt date and time work. DW

## 2019-06-16 ENCOUNTER — Other Ambulatory Visit: Payer: Self-pay | Admitting: Neurology

## 2019-07-27 ENCOUNTER — Other Ambulatory Visit: Payer: Self-pay

## 2019-07-27 ENCOUNTER — Ambulatory Visit (INDEPENDENT_AMBULATORY_CARE_PROVIDER_SITE_OTHER): Payer: 59 | Admitting: Neurology

## 2019-07-27 VITALS — Temp 97.9°F

## 2019-07-27 DIAGNOSIS — G43711 Chronic migraine without aura, intractable, with status migrainosus: Secondary | ICD-10-CM | POA: Diagnosis not present

## 2019-07-27 NOTE — Progress Notes (Signed)
Consent Form Botulism Toxin Injection For Chronic Migraine    Interval history 07/27/2019:  She has done tremendously well with NO MIGRAINES since last botox injection again she doesn't remember what it feels like to have headaches. Baseline headache frequency is daily headaches with 15 migrainous.  100% reduction in frequency and severity of headaches and migraines. +a +spock brows so be careful on placement, + behind ear auricular temporal. Also she has large breasts, we have discussed in the past a breast reduction may help with her cervicalgia, happy to document this for her should she need it. She has stopped taking the Ajovy and still doing well. We can push out botox to 16 weeks and see how she does.  try nurtec.    Consent Form Botulism Toxin Injection For Chronic Migraine    Reviewed orally with patient, additionally signature is on file:  Botulism toxin has been approved by the Federal drug administration for treatment of chronic migraine. Botulism toxin does not cure chronic migraine and it may not be effective in some patients.  The administration of botulism toxin is accomplished by injecting a small amount of toxin into the muscles of the neck and head. Dosage must be titrated for each individual. Any benefits resulting from botulism toxin tend to wear off after 3 months with a repeat injection required if benefit is to be maintained. Injections are usually done every 3-4 months with maximum effect peak achieved by about 2 or 3 weeks. Botulism toxin is expensive and you should be sure of what costs you will incur resulting from the injection.  The side effects of botulism toxin use for chronic migraine may include:   -Transient, and usually mild, facial weakness with facial injections  -Transient, and usually mild, head or neck weakness with head/neck injections  -Reduction or loss of forehead facial animation due to forehead muscle weakness  -Eyelid drooping  -Dry eye  -Pain at the site of injection or bruising at the site of injection  -Double vision  -Potential unknown long term risks  Contraindications: You should not have Botox if you are pregnant, nursing, allergic to albumin, have an infection, skin condition, or muscle weakness at the site of the injection, or have myasthenia gravis, Lambert-Eaton syndrome, or ALS.  It is also possible that as with any injection, there may be an allergic reaction or no effect from the medication. Reduced effectiveness after repeated injections is sometimes seen and rarely infection at the injection site may occur. All care will be taken to prevent these side effects. If therapy is given over a long time, atrophy and wasting in the muscle injected may occur. Occasionally the patient's become refractory to treatment because they develop antibodies to the toxin. In this event, therapy needs to be modified.  I have read the above information and consent to the administration of botulism toxin.    BOTOX PROCEDURE NOTE FOR MIGRAINE HEADACHE    Contraindications and precautions discussed with patient(above). Aseptic procedure was observed and patient tolerated procedure. Procedure performed by Dr. Georgia Dom  The condition has existed for more than 6 months, and pt does not have a diagnosis of ALS, Myasthenia Gravis or Lambert-Eaton Syndrome.  Risks and benefits of injections discussed and pt agrees to proceed with the procedure.  Written consent obtained  These injections are medically necessary. Pt  receives good benefits from these injections. These injections do not cause sedations or hallucinations which the oral therapies may cause.  Description of procedure:  The  patient was placed in a sitting position. The standard protocol was used for Botox as follows, with 5 units of Botox injected at each site:   -Procerus muscle, midline injection  -Corrugator muscle, bilateral injection  -Frontalis muscle, bilateral  injection, with 2 sites each side, medial injection was performed in the upper one third of the frontalis muscle, in the region vertical from the medial inferior edge of the superior orbital rim. The lateral injection was again in the upper one third of the forehead vertically above the lateral limbus of the cornea, 1.5 cm lateral to the medial injection site.  -Temporalis muscle injection, 4 sites, bilaterally. The first injection was 3 cm above the tragus of the ear, second injection site was 1.5 cm to 3 cm up from the first injection site in line with the tragus of the ear. The third injection site was 1.5-3 cm forward between the first 2 injection sites. The fourth injection site was 1.5 cm posterior to the second injection site.   -Occipitalis muscle injection, 3 sites, bilaterally. The first injection was done one half way between the occipital protuberance and the tip of the mastoid process behind the ear. The second injection site was done lateral and superior to the first, 1 fingerbreadth from the first injection. The third injection site was 1 fingerbreadth superiorly and medially from the first injection site.  -Cervical paraspinal muscle injection, 2 sites, bilateral knee first injection site was 1 cm from the midline of the cervical spine, 3 cm inferior to the lower border of the occipital protuberance. The second injection site was 1.5 cm superiorly and laterally to the first injection site.  -Trapezius muscle injection was performed at 3 sites, bilaterally. The first injection site was in the upper trapezius muscle halfway between the inflection point of the neck, and the acromion. The second injection site was one half way between the acromion and the first injection site. The third injection was done between the first injection site and the inflection point of the neck.   Will return for repeat injection in 3 months.   200 units of Botox was used, any Botox not injected was wasted. The  patient tolerated the procedure well, there were no complications of the above procedure.

## 2019-07-27 NOTE — Progress Notes (Signed)
Botox- 100 units x 2 vials Lot: [id number] Expiration: 03/2022 NDC: [id number]  Bacteriostatic 0.9% Sodium Chloride- 22mL total Lot: [id number] Expiration: 11/25/2019 NDC: [id number]  Dx: [id number] S/P

## 2019-11-03 ENCOUNTER — Ambulatory Visit: Payer: 59 | Admitting: Neurology

## 2019-11-30 ENCOUNTER — Telehealth: Payer: Self-pay | Admitting: *Deleted

## 2019-11-30 NOTE — Telephone Encounter (Signed)
I called [employer] [phone] and spoke to [facility]. She states that [id number] and [phone] are billable and do not require PA. Ref# for call is [id number].   I called [facility] [phone] and spoke  to [place].  She states that they do have an RX already on file but need patients consent.  She was going to contact the PA dept to reach out to patient.  I also called patient to make her aware that they will be calling her and also provided her with their number to call if she prefers.  Once consent is obtained they will call us to schedule delivery.

## 2019-12-01 NOTE — Telephone Encounter (Signed)
Ivin Booty @ [facility] is asking for a call from Hinton Dyer re: the delivery of pt's Botox, she can be reached at [phone]

## 2019-12-01 NOTE — Telephone Encounter (Signed)
Returned call to [employer] [phone] and spoke to Rhodes. Scheduled Botox delivery for  12/02/2019. Ref# for call is [id number].

## 2019-12-06 NOTE — Progress Notes (Signed)
Consent Form Botulism Toxin Injection For Chronic Migraine    Interval history 12/07/2019:  She has done tremendously well with NO MIGRAINES since last botox injection again she doesn't remember what it feels like to have headaches. Baseline headache frequency is daily headaches with 15 migrainous.  100% reduction in frequency and severity of headaches and migraines. +a +spock brows so be careful on placement, + behind ear auricular temporal. Also she has large breasts, we have discussed in the past a breast reduction may help with her cervicalgia, happy to document this for her should she need it. She has stopped taking the Ajovy and still doing well. We can push out botox to 16 weeks and see how she does.  try nurtec.    Consent Form Botulism Toxin Injection For Chronic Migraine    Reviewed orally with patient, additionally signature is on file:  Botulism toxin has been approved by the Federal drug administration for treatment of chronic migraine. Botulism toxin does not cure chronic migraine and it may not be effective in some patients.  The administration of botulism toxin is accomplished by injecting a small amount of toxin into the muscles of the neck and head. Dosage must be titrated for each individual. Any benefits resulting from botulism toxin tend to wear off after 3 months with a repeat injection required if benefit is to be maintained. Injections are usually done every 3-4 months with maximum effect peak achieved by about 2 or 3 weeks. Botulism toxin is expensive and you should be sure of what costs you will incur resulting from the injection.  The side effects of botulism toxin use for chronic migraine may include:   -Transient, and usually mild, facial weakness with facial injections  -Transient, and usually mild, head or neck weakness with head/neck injections  -Reduction or loss of forehead facial animation due to forehead muscle weakness  -Eyelid drooping  -Dry  eye  -Pain at the site of injection or bruising at the site of injection  -Double vision  -Potential unknown long term risks  Contraindications: You should not have Botox if you are pregnant, nursing, allergic to albumin, have an infection, skin condition, or muscle weakness at the site of the injection, or have myasthenia gravis, Lambert-Eaton syndrome, or ALS.  It is also possible that as with any injection, there may be an allergic reaction or no effect from the medication. Reduced effectiveness after repeated injections is sometimes seen and rarely infection at the injection site may occur. All care will be taken to prevent these side effects. If therapy is given over a long time, atrophy and wasting in the muscle injected may occur. Occasionally the patient's become refractory to treatment because they develop antibodies to the toxin. In this event, therapy needs to be modified.  I have read the above information and consent to the administration of botulism toxin.    BOTOX PROCEDURE NOTE FOR MIGRAINE HEADACHE    Contraindications and precautions discussed with patient(above). Aseptic procedure was observed and patient tolerated procedure. Procedure performed by Dr. Georgia Dom  The condition has existed for more than 6 months, and pt does not have a diagnosis of ALS, Myasthenia Gravis or Lambert-Eaton Syndrome.  Risks and benefits of injections discussed and pt agrees to proceed with the procedure.  Written consent obtained  These injections are medically necessary. Pt  receives good benefits from these injections. These injections do not cause sedations or hallucinations which the oral therapies may cause.  Description of procedure:  The patient was placed in a sitting position. The standard protocol was used for Botox as follows, with 5 units of Botox injected at each site:   -Procerus muscle, midline injection  -Corrugator muscle, bilateral injection  -Frontalis muscle,  bilateral injection, with 2 sites each side, medial injection was performed in the upper one third of the frontalis muscle, in the region vertical from the medial inferior edge of the superior orbital rim. The lateral injection was again in the upper one third of the forehead vertically above the lateral limbus of the cornea, 1.5 cm lateral to the medial injection site.  -Temporalis muscle injection, 4 sites, bilaterally. The first injection was 3 cm above the tragus of the ear, second injection site was 1.5 cm to 3 cm up from the first injection site in line with the tragus of the ear. The third injection site was 1.5-3 cm forward between the first 2 injection sites. The fourth injection site was 1.5 cm posterior to the second injection site.   -Occipitalis muscle injection, 3 sites, bilaterally. The first injection was done one half way between the occipital protuberance and the tip of the mastoid process behind the ear. The second injection site was done lateral and superior to the first, 1 fingerbreadth from the first injection. The third injection site was 1 fingerbreadth superiorly and medially from the first injection site.  -Cervical paraspinal muscle injection, 2 sites, bilateral knee first injection site was 1 cm from the midline of the cervical spine, 3 cm inferior to the lower border of the occipital protuberance. The second injection site was 1.5 cm superiorly and laterally to the first injection site.  -Trapezius muscle injection was performed at 3 sites, bilaterally. The first injection site was in the upper trapezius muscle halfway between the inflection point of the neck, and the acromion. The second injection site was one half way between the acromion and the first injection site. The third injection was done between the first injection site and the inflection point of the neck.   Will return for repeat injection in 3 months.   200 units of Botox was used, any Botox not injected was  wasted. The patient tolerated the procedure well, there were no complications of the above procedure.

## 2019-12-07 ENCOUNTER — Other Ambulatory Visit: Payer: Self-pay

## 2019-12-07 ENCOUNTER — Ambulatory Visit (INDEPENDENT_AMBULATORY_CARE_PROVIDER_SITE_OTHER): Payer: 59 | Admitting: Neurology

## 2019-12-07 VITALS — Temp 97.5°F

## 2019-12-07 DIAGNOSIS — G43711 Chronic migraine without aura, intractable, with status migrainosus: Secondary | ICD-10-CM | POA: Diagnosis not present

## 2019-12-07 MED ORDER — CYCLOBENZAPRINE HCL 10 MG PO TABS: 10.0000 mg | ORAL_TABLET | Freq: Every day | ORAL | 3 refills | Status: DC

## 2019-12-07 NOTE — Progress Notes (Signed)
Botox- 100 units x 2 vials Lot: [id number] Expiration: 02/2022 NDC: [id number]  Bacteriostatic 0.9% Sodium Chloride- 71mL total Lot: [id number] Expiration: 02/24/2020 NDC: [id number]  Dx: [id number] S/P

## 2020-01-12 ENCOUNTER — Other Ambulatory Visit: Payer: Self-pay | Admitting: Family Medicine

## 2020-01-12 DIAGNOSIS — Z1231 Encounter for screening mammogram for malignant neoplasm of breast: Secondary | ICD-10-CM

## 2020-01-15 IMAGING — CT CT BIOPSY
1 of 7 series · 3 of 16 positions shown, 4 images · non-contrast
Comparison: none

CLINICAL DATA: Left sacroiliac joint pain. Prior relief after
anesthetic only sacroiliac joint injection on 11/24/2017.

[Series 2: needle -guided injection · axial · 0.91mm/px · z∈[+898,+950]mm · 3 of 27 slices shown, 4 images]
[im 1/27  soft-tissue]
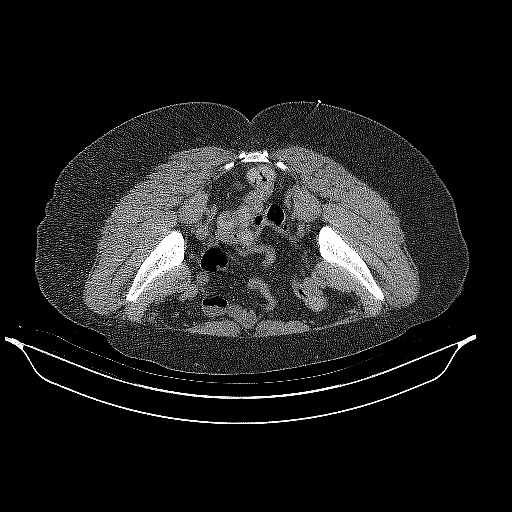
[im 1/27  bone]
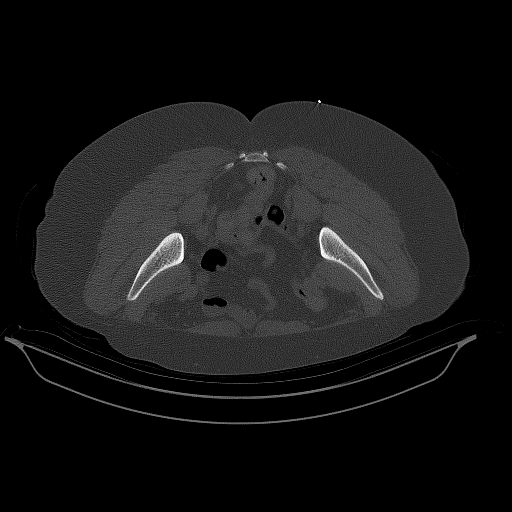
[im 14/27  soft-tissue]
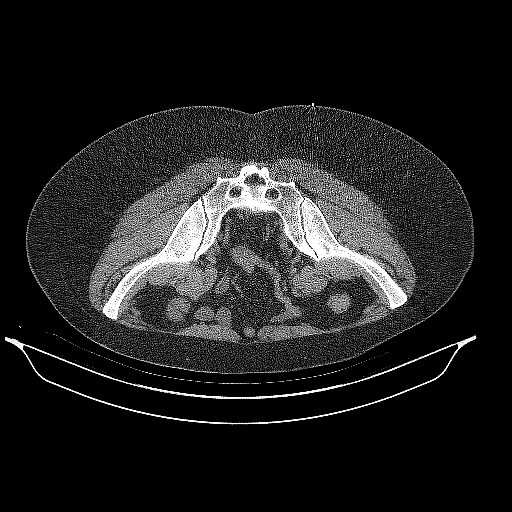
[im 27/27  soft-tissue]
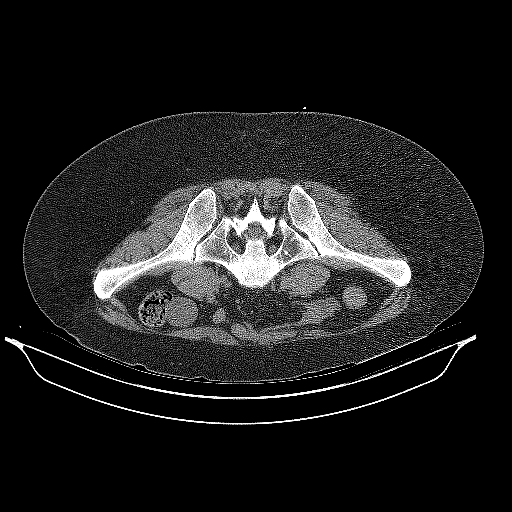

[3 of 16 positions shown; findings below may reference images not displayed]

PROCEDURE:
CT-GUIDED LEFT SI JOINT INJECTION.

After a thorough discussion of risks and benefits of the procedure,
including bleeding, infection, injury to nerves, blood vessels, and
adjacent structures, verbal and written consent was obtained. The
patient was placed prone on the CT table and localization was
performed over the sacrum. Target site marked using CT guidance. The
skin was prepped and draped in the usual sterile fashion using
Betadine soap.

After local anesthesia with 1% lidocaine without epinephrine and
subsequent deep anesthesia, a 5 inch 22 gauge spinal needle was
advanced into the left SI joint. Injection of 0.5 ml Isovue-M 200
confirmed intra-articular placement. No vascular uptake present.
Subsequently, 80 mg of Depo-Medrol mixed with 1 ml of 0.5%
bupivacaine was injected into the left SI joint. Needle was removed
and a sterile dressing applied.

No complications were observed. The patient was observed and
released under the care of a driver after 30 minutes.
IMPRESSION: Successful CT guided left SI joint steroid injection.

## 2020-02-01 ENCOUNTER — Other Ambulatory Visit: Payer: Self-pay | Admitting: Neurology

## 2020-02-07 ENCOUNTER — Ambulatory Visit: Payer: 59

## 2020-02-10 ENCOUNTER — Ambulatory Visit
Admission: RE | Admit: 2020-02-10 | Discharge: 2020-02-10 | Disposition: A | Discharge status: Home / Self Care | Payer: 59 | Source: Ambulatory Visit | Attending: Family Medicine | Admitting: Family Medicine

## 2020-02-10 ENCOUNTER — Other Ambulatory Visit: Payer: Self-pay

## 2020-02-10 DIAGNOSIS — Z1231 Encounter for screening mammogram for malignant neoplasm of breast: Secondary | ICD-10-CM

## 2020-02-14 ENCOUNTER — Other Ambulatory Visit: Payer: Self-pay | Admitting: Family Medicine

## 2020-02-14 DIAGNOSIS — R928 Other abnormal and inconclusive findings on diagnostic imaging of breast: Secondary | ICD-10-CM

## 2020-02-25 ENCOUNTER — Ambulatory Visit
Admission: RE | Admit: 2020-02-25 | Discharge: 2020-02-25 | Disposition: A | Discharge status: Home / Self Care | Payer: 59 | Source: Ambulatory Visit | Attending: Family Medicine | Admitting: Family Medicine

## 2020-02-25 ENCOUNTER — Other Ambulatory Visit: Payer: Self-pay

## 2020-02-25 DIAGNOSIS — R928 Other abnormal and inconclusive findings on diagnostic imaging of breast: Secondary | ICD-10-CM

## 2020-05-02 ENCOUNTER — Telehealth: Payer: Self-pay | Admitting: Neurology

## 2020-05-02 NOTE — Telephone Encounter (Signed)
I called [facility] to schedule delivery of Botox for 9/14 appointment. I spoke with Lexine Baton, who states medical benefit review is needed. She advised me to call back on 9/10.

## 2020-05-08 NOTE — Telephone Encounter (Signed)
I received a voicemail from [facility] stating that they will need to reschedule Botox delivery date. I called back and spoke with Ruffy, who states that medical benefit review was actually completed today. I was told on Friday that it had already been completed. Ruffy states that he can have Botox delivered on 9/15. Patient's appointment is tomorrow, but I will replace office stock once shipment arrives.

## 2020-05-08 NOTE — Progress Notes (Signed)
Consent Form Botulism Toxin Injection For Chronic Migraine    Interval history 05/09/2020:  She has done tremendously well with NO MIGRAINES since last botox injection again she doesn't remember what it feels like to have headaches. Baseline headache frequency is daily headaches with 15 migrainous.  100% reduction in frequency and severity of headaches and migraines. +a +spock brows so be careful on placement high in the forehead, + right behind ear auricular temporal. She has stopped taking the Ajovy and still doing well. We can push out botox to 20 weeks and see how she does.  try nurtec.    Consent Form Botulism Toxin Injection For Chronic Migraine    Reviewed orally with patient, additionally signature is on file:  Botulism toxin has been approved by the Federal drug administration for treatment of chronic migraine. Botulism toxin does not cure chronic migraine and it may not be effective in some patients.  The administration of botulism toxin is accomplished by injecting a small amount of toxin into the muscles of the neck and head. Dosage must be titrated for each individual. Any benefits resulting from botulism toxin tend to wear off after 3 months with a repeat injection required if benefit is to be maintained. Injections are usually done every 3-4 months with maximum effect peak achieved by about 2 or 3 weeks. Botulism toxin is expensive and you should be sure of what costs you will incur resulting from the injection.  The side effects of botulism toxin use for chronic migraine may include:   -Transient, and usually mild, facial weakness with facial injections  -Transient, and usually mild, head or neck weakness with head/neck injections  -Reduction or loss of forehead facial animation due to forehead muscle weakness  -Eyelid drooping  -Dry eye  -Pain at the site of injection or bruising at the site of injection  -Double vision  -Potential unknown long term  risks  Contraindications: You should not have Botox if you are pregnant, nursing, allergic to albumin, have an infection, skin condition, or muscle weakness at the site of the injection, or have myasthenia gravis, Lambert-Eaton syndrome, or ALS.  It is also possible that as with any injection, there may be an allergic reaction or no effect from the medication. Reduced effectiveness after repeated injections is sometimes seen and rarely infection at the injection site may occur. All care will be taken to prevent these side effects. If therapy is given over a long time, atrophy and wasting in the muscle injected may occur. Occasionally the patient's become refractory to treatment because they develop antibodies to the toxin. In this event, therapy needs to be modified.  I have read the above information and consent to the administration of botulism toxin.    BOTOX PROCEDURE NOTE FOR MIGRAINE HEADACHE    Contraindications and precautions discussed with patient(above). Aseptic procedure was observed and patient tolerated procedure. Procedure performed by Dr. Georgia Dom  The condition has existed for more than 6 months, and pt does not have a diagnosis of ALS, Myasthenia Gravis or Lambert-Eaton Syndrome.  Risks and benefits of injections discussed and pt agrees to proceed with the procedure.  Written consent obtained  These injections are medically necessary. Pt  receives good benefits from these injections. These injections do not cause sedations or hallucinations which the oral therapies may cause.  Description of procedure:  The patient was placed in a sitting position. The standard protocol was used for Botox as follows, with 5 units of Botox injected at  each site:   -Procerus muscle, midline injection  -Corrugator muscle, bilateral injection  -Frontalis muscle, bilateral injection, with 2 sites each side, medial injection was performed in the upper one third of the frontalis muscle, in  the region vertical from the medial inferior edge of the superior orbital rim. The lateral injection was again in the upper one third of the forehead vertically above the lateral limbus of the cornea, 1.5 cm lateral to the medial injection site.  -Temporalis muscle injection, 4 sites, bilaterally. The first injection was 3 cm above the tragus of the ear, second injection site was 1.5 cm to 3 cm up from the first injection site in line with the tragus of the ear. The third injection site was 1.5-3 cm forward between the first 2 injection sites. The fourth injection site was 1.5 cm posterior to the second injection site.   -Occipitalis muscle injection, 3 sites, bilaterally. The first injection was done one half way between the occipital protuberance and the tip of the mastoid process behind the ear. The second injection site was done lateral and superior to the first, 1 fingerbreadth from the first injection. The third injection site was 1 fingerbreadth superiorly and medially from the first injection site.  -Cervical paraspinal muscle injection, 2 sites, bilateral knee first injection site was 1 cm from the midline of the cervical spine, 3 cm inferior to the lower border of the occipital protuberance. The second injection site was 1.5 cm superiorly and laterally to the first injection site.  -Trapezius muscle injection was performed at 3 sites, bilaterally. The first injection site was in the upper trapezius muscle halfway between the inflection point of the neck, and the acromion. The second injection site was one half way between the acromion and the first injection site. The third injection was done between the first injection site and the inflection point of the neck.   Will return for repeat injection in 3 months.   200 units of Botox was used, any Botox not injected was wasted. The patient tolerated the procedure well, there were no complications of the above procedure.

## 2020-05-08 NOTE — Telephone Encounter (Signed)
I called [facility] on Friday, 9/10 and spoke with Joe. He was able to schedule Botox delivery for 9/13.He states patient has one remaining refill after this shipment.

## 2020-05-09 ENCOUNTER — Other Ambulatory Visit: Payer: Self-pay

## 2020-05-09 ENCOUNTER — Ambulatory Visit (INDEPENDENT_AMBULATORY_CARE_PROVIDER_SITE_OTHER): Payer: 59 | Admitting: Neurology

## 2020-05-09 DIAGNOSIS — G43711 Chronic migraine without aura, intractable, with status migrainosus: Secondary | ICD-10-CM

## 2020-05-09 NOTE — Progress Notes (Signed)
Botox- 200 units x 1 vial Lot: [id number] Expiration: 005/2024 NDC: 7902-4097-35  Bacteriostatic 0.9% Sodium Chloride- 53mL total Lot: [id number] Expiration: 05/26/2021 NDC: 2683-4196-22  Dx: W97.989 B/B

## 2020-05-10 NOTE — Telephone Encounter (Signed)
(  2) 100U vials delivered today from [place]. I removed patient label and placed in office stock to replace vials used for 9/14 appointment.

## 2020-05-10 NOTE — Addendum Note (Signed)
Addended by: Gildardo Griffes on: 05/10/2020 09:46 AM   Modules accepted: Orders

## 2020-09-18 ENCOUNTER — Telehealth: Payer: Self-pay | Admitting: Neurology

## 2020-09-18 NOTE — Telephone Encounter (Signed)
Patient has a Botox appointment 2/15. I called [facility] and spoke with [place] to check status of Botox order. Richmond Campbell states the order will have to go through medical benefit review which should take 24-48 hours to complete.

## 2020-09-28 NOTE — Telephone Encounter (Signed)
I called [facility] and spoke with Nori Riis to check status of order. He states Botox TBD 2/7. Order #[id number].

## 2020-10-03 NOTE — Telephone Encounter (Signed)
(  2) 100U vials of Botox delivered today from [facility]. 

## 2020-10-10 ENCOUNTER — Other Ambulatory Visit: Payer: Self-pay

## 2020-10-10 ENCOUNTER — Ambulatory Visit (INDEPENDENT_AMBULATORY_CARE_PROVIDER_SITE_OTHER): Payer: 59 | Admitting: Neurology

## 2020-10-10 DIAGNOSIS — G43711 Chronic migraine without aura, intractable, with status migrainosus: Secondary | ICD-10-CM | POA: Diagnosis not present

## 2020-10-10 MED ORDER — NURTEC 75 MG PO TBDP: 75.0000 mg | ORAL_TABLET | Freq: Every day | ORAL | 5 refills | Status: DC | PRN

## 2020-10-10 NOTE — Progress Notes (Signed)
Botox- 100 units x 2 vials Lot: [id number] Expiration: 10/2022 NDC: 0023-1145-01  Bacteriostatic 0.9% Sodium Chloride- 4mL total Lot: [id number] Expiration: 09/26/2021 NDC: 0409-1966-02  Dx: G43.711 S/P 

## 2020-10-10 NOTE — Progress Notes (Signed)
Consent Form Botulism Toxin Injection For Chronic Migraine  Interval history 10/10/2020: she has been able to go 16-18 weeks without a migraine. Tremendous improvement.   Interval history 05/09/2020:  She has done tremendously well with NO MIGRAINES since last botox injection again she doesn't remember what it feels like to have headaches. Baseline headache frequency is daily headaches with 15 migrainous.  100% reduction in frequency and severity of headaches and migraines. +a +spock brows so be careful on placement high in the forehead, + right behind ear auricular temporal. She has stopped taking the Ajovy and still doing well. We can push out botox to 20 weeks and see how she does.  try nurtec.    Consent Form Botulism Toxin Injection For Chronic Migraine    Reviewed orally with patient, additionally signature is on file:  Botulism toxin has been approved by the Federal drug administration for treatment of chronic migraine. Botulism toxin does not cure chronic migraine and it may not be effective in some patients.  The administration of botulism toxin is accomplished by injecting a small amount of toxin into the muscles of the neck and head. Dosage must be titrated for each individual. Any benefits resulting from botulism toxin tend to wear off after 3 months with a repeat injection required if benefit is to be maintained. Injections are usually done every 3-4 months with maximum effect peak achieved by about 2 or 3 weeks. Botulism toxin is expensive and you should be sure of what costs you will incur resulting from the injection.  The side effects of botulism toxin use for chronic migraine may include:   -Transient, and usually mild, facial weakness with facial injections  -Transient, and usually mild, head or neck weakness with head/neck injections  -Reduction or loss of forehead facial animation due to forehead muscle weakness  -Eyelid drooping  -Dry eye  -Pain at the site of  injection or bruising at the site of injection  -Double vision  -Potential unknown long term risks  Contraindications: You should not have Botox if you are pregnant, nursing, allergic to albumin, have an infection, skin condition, or muscle weakness at the site of the injection, or have myasthenia gravis, Lambert-Eaton syndrome, or ALS.  It is also possible that as with any injection, there may be an allergic reaction or no effect from the medication. Reduced effectiveness after repeated injections is sometimes seen and rarely infection at the injection site may occur. All care will be taken to prevent these side effects. If therapy is given over a long time, atrophy and wasting in the muscle injected may occur. Occasionally the patient's become refractory to treatment because they develop antibodies to the toxin. In this event, therapy needs to be modified.  I have read the above information and consent to the administration of botulism toxin.    BOTOX PROCEDURE NOTE FOR MIGRAINE HEADACHE    Contraindications and precautions discussed with patient(above). Aseptic procedure was observed and patient tolerated procedure. Procedure performed by Dr. Georgia Dom  The condition has existed for more than 6 months, and pt does not have a diagnosis of ALS, Myasthenia Gravis or Lambert-Eaton Syndrome.  Risks and benefits of injections discussed and pt agrees to proceed with the procedure.  Written consent obtained  These injections are medically necessary. Pt  receives good benefits from these injections. These injections do not cause sedations or hallucinations which the oral therapies may cause.  Description of procedure:  The patient was placed in a sitting position.  The standard protocol was used for Botox as follows, with 5 units of Botox injected at each site:   -Procerus muscle, midline injection  -Corrugator muscle, bilateral injection  -Frontalis muscle, bilateral injection, with 2 sites  each side, medial injection was performed in the upper one third of the frontalis muscle, in the region vertical from the medial inferior edge of the superior orbital rim. The lateral injection was again in the upper one third of the forehead vertically above the lateral limbus of the cornea, 1.5 cm lateral to the medial injection site.  -Temporalis muscle injection, 4 sites, bilaterally. The first injection was 3 cm above the tragus of the ear, second injection site was 1.5 cm to 3 cm up from the first injection site in line with the tragus of the ear. The third injection site was 1.5-3 cm forward between the first 2 injection sites. The fourth injection site was 1.5 cm posterior to the second injection site.   -Occipitalis muscle injection, 3 sites, bilaterally. The first injection was done one half way between the occipital protuberance and the tip of the mastoid process behind the ear. The second injection site was done lateral and superior to the first, 1 fingerbreadth from the first injection. The third injection site was 1 fingerbreadth superiorly and medially from the first injection site.  -Cervical paraspinal muscle injection, 2 sites, bilateral knee first injection site was 1 cm from the midline of the cervical spine, 3 cm inferior to the lower border of the occipital protuberance. The second injection site was 1.5 cm superiorly and laterally to the first injection site.  -Trapezius muscle injection was performed at 3 sites, bilaterally. The first injection site was in the upper trapezius muscle halfway between the inflection point of the neck, and the acromion. The second injection site was one half way between the acromion and the first injection site. The third injection was done between the first injection site and the inflection point of the neck.   Will return for repeat injection in 3 months.   200 units of Botox was used, any Botox not injected was wasted. The patient tolerated the  procedure well, there were no complications of the above procedure.

## 2020-10-19 ENCOUNTER — Telehealth: Payer: Self-pay | Admitting: *Deleted

## 2020-10-19 NOTE — Telephone Encounter (Signed)
Completed Nurtec PA on Cover My Meds. Key: [id number]. Awaiting determination from [facility].

## 2020-10-26 NOTE — Telephone Encounter (Signed)
Per [facility], "Request Reference Number: [id number]. NURTEC TAB 75MG  ODT is denied for not meeting the prior authorization requirement(s). Details of this decision are in the notice attached below or have been faxed to you. Appeals are not supported through [place]. Please refer to the fax case notice for appeals information and instructions."  From denial letter:  "Why was my request denied? This request was denied because you did not meet the following clinical requirements: The requested medication and/or diagnosis are not a covered benefit and excluded from coverage in accordance with the terms and conditions of your plan benefit. Therefore, the request has been administratively denied. The requested medication and/or diagnosis are not a covered benefit and are excluded from coverage in accordance with the terms and conditions of your plan benefit. Therefore, this request has been administratively denied."  If appeal desired, fax within 180 calendar days to [phone].   May consider trying Ubrelvy instead?

## 2020-10-27 NOTE — Telephone Encounter (Signed)
Please do if she is ok with it thanks!

## 2020-11-20 ENCOUNTER — Other Ambulatory Visit: Payer: Self-pay | Admitting: Neurology

## 2021-02-07 ENCOUNTER — Telehealth: Payer: Self-pay | Admitting: Neurology

## 2021-02-07 NOTE — Telephone Encounter (Signed)
Patient has a Botox appointment on 6/21. I called [facility] @ [phone] and spoke with Freda Munro to schedule Botox delivery. Freda Munro states medical benefit review for the month of June has not been completed. She expedited the review to priority and advised me to check back in 24-48 hours.

## 2021-02-08 NOTE — Telephone Encounter (Signed)
Spoke with Tamika at [facility] to schedule Botox delivery. Botox TBD 6/21.

## 2021-02-13 ENCOUNTER — Other Ambulatory Visit: Payer: Self-pay | Admitting: Family Medicine

## 2021-02-13 ENCOUNTER — Ambulatory Visit (INDEPENDENT_AMBULATORY_CARE_PROVIDER_SITE_OTHER): Payer: 59 | Admitting: Neurology

## 2021-02-13 DIAGNOSIS — G43711 Chronic migraine without aura, intractable, with status migrainosus: Secondary | ICD-10-CM

## 2021-02-13 DIAGNOSIS — Z1231 Encounter for screening mammogram for malignant neoplasm of breast: Secondary | ICD-10-CM

## 2021-02-13 NOTE — Telephone Encounter (Signed)
Received (2) 100 unit vials of Botox today from [facility]. 

## 2021-02-13 NOTE — Progress Notes (Signed)
Consent Form Botulism Toxin Injection For Chronic Migraine  02/13/2021: Doing exceptionally well! Has 4 kids. First daughter off to [place] +spock brows so be careful on placement high in the forehead, this time spread out the 20 units across between the 2 lines on her forehead to see if this would help more. + right behind ear auriculotemporal nerve Interval history 10/10/2020: she has been able to go 20 weeks without a migraine. Tremendous improvement.    Interval history 05/09/2020:  She has done tremendously well with NO MIGRAINES since last botox injection again she doesn't remember what it feels like to have headaches. Baseline headache frequency is daily headaches with 15 migrainous.  100% reduction in frequency and severity of headaches and migraines. +a +spock brows so be careful on placement high in the forehead, + right behind ear auricular temporal. She has stopped taking the Ajovy and still doing well. We can push out botox to 20 weeks and see how she does.  try nurtec.    Consent Form Botulism Toxin Injection For Chronic Migraine    Reviewed orally with patient, additionally signature is on file:  Botulism toxin has been approved by the Federal drug administration for treatment of chronic migraine. Botulism toxin does not cure chronic migraine and it may not be effective in some patients.  The administration of botulism toxin is accomplished by injecting a small amount of toxin into the muscles of the neck and head. Dosage must be titrated for each individual. Any benefits resulting from botulism toxin tend to wear off after 3 months with a repeat injection required if benefit is to be maintained. Injections are usually done every 3-4 months with maximum effect peak achieved by about 2 or 3 weeks. Botulism toxin is expensive and you should be sure of what costs you will incur resulting from the injection.  The side effects of botulism toxin use for chronic migraine may  include:   -Transient, and usually mild, facial weakness with facial injections  -Transient, and usually mild, head or neck weakness with head/neck injections  -Reduction or loss of forehead facial animation due to forehead muscle weakness  -Eyelid drooping  -Dry eye  -Pain at the site of injection or bruising at the site of injection  -Double vision  -Potential unknown long term risks  Contraindications: You should not have Botox if you are pregnant, nursing, allergic to albumin, have an infection, skin condition, or muscle weakness at the site of the injection, or have myasthenia gravis, Lambert-Eaton syndrome, or ALS.  It is also possible that as with any injection, there may be an allergic reaction or no effect from the medication. Reduced effectiveness after repeated injections is sometimes seen and rarely infection at the injection site may occur. All care will be taken to prevent these side effects. If therapy is given over a long time, atrophy and wasting in the muscle injected may occur. Occasionally the patient's become refractory to treatment because they develop antibodies to the toxin. In this event, therapy needs to be modified.  I have read the above information and consent to the administration of botulism toxin.    BOTOX PROCEDURE NOTE FOR MIGRAINE HEADACHE    Contraindications and precautions discussed with patient(above). Aseptic procedure was observed and patient tolerated procedure. Procedure performed by Dr. Georgia Dom  The condition has existed for more than 6 months, and pt does not have a diagnosis of ALS, Myasthenia Gravis or Lambert-Eaton Syndrome.  Risks and benefits of injections  discussed and pt agrees to proceed with the procedure.  Written consent obtained  These injections are medically necessary. Pt  receives good benefits from these injections. These injections do not cause sedations or hallucinations which the oral therapies may cause.  Description of  procedure:  The patient was placed in a sitting position. The standard protocol was used for Botox as follows, with 5 units of Botox injected at each site:   -Procerus muscle, midline injection  -Corrugator muscle, bilateral injection  -Frontalis muscle, bilateral injection, with 2 sites each side, medial injection was performed in the upper one third of the frontalis muscle, in the region vertical from the medial inferior edge of the superior orbital rim. The lateral injection was again in the upper one third of the forehead vertically above the lateral limbus of the cornea, 1.5 cm lateral to the medial injection site.  -Temporalis muscle injection, 4 sites, bilaterally. The first injection was 3 cm above the tragus of the ear, second injection site was 1.5 cm to 3 cm up from the first injection site in line with the tragus of the ear. The third injection site was 1.5-3 cm forward between the first 2 injection sites. The fourth injection site was 1.5 cm posterior to the second injection site.   -Occipitalis muscle injection, 3 sites, bilaterally. The first injection was done one half way between the occipital protuberance and the tip of the mastoid process behind the ear. The second injection site was done lateral and superior to the first, 1 fingerbreadth from the first injection. The third injection site was 1 fingerbreadth superiorly and medially from the first injection site.  -Cervical paraspinal muscle injection, 2 sites, bilateral knee first injection site was 1 cm from the midline of the cervical spine, 3 cm inferior to the lower border of the occipital protuberance. The second injection site was 1.5 cm superiorly and laterally to the first injection site.  -Trapezius muscle injection was performed at 3 sites, bilaterally. The first injection site was in the upper trapezius muscle halfway between the inflection point of the neck, and the acromion. The second injection site was one half way  between the acromion and the first injection site. The third injection was done between the first injection site and the inflection point of the neck.   Will return for repeat injection in 3 months.   200 units of Botox was used, any Botox not injected was wasted. The patient tolerated the procedure well, there were no complications of the above procedure.

## 2021-02-13 NOTE — Progress Notes (Signed)
Botox- 100 units x 2 vials Lot: [id number] Expiration: 04/2022 NDC: 3276-1470-92  Bacteriostatic 0.9% Sodium Chloride- 54mL total Lot: [id number] Expiration: 03/16/2021 NDC: 4037-0964-38  Dx: V81.840 S/P

## 2021-02-21 ENCOUNTER — Ambulatory Visit
Admission: RE | Admit: 2021-02-21 | Discharge: 2021-02-21 | Disposition: A | Discharge status: Home / Self Care | Payer: 59 | Source: Ambulatory Visit | Attending: Family Medicine | Admitting: Family Medicine

## 2021-02-21 ENCOUNTER — Other Ambulatory Visit: Payer: Self-pay

## 2021-02-21 DIAGNOSIS — Z1231 Encounter for screening mammogram for malignant neoplasm of breast: Secondary | ICD-10-CM

## 2021-02-27 ENCOUNTER — Other Ambulatory Visit: Payer: Self-pay | Admitting: Family Medicine

## 2021-02-27 DIAGNOSIS — R928 Other abnormal and inconclusive findings on diagnostic imaging of breast: Secondary | ICD-10-CM

## 2021-03-06 NOTE — Telephone Encounter (Signed)
Request Reference Number: [id number]. NURTEC TAB 75MG  ODT is approved through 03/06/2022. Your patient may now fill this prescription and it will be covered.

## 2021-03-06 NOTE — Telephone Encounter (Signed)
Received another PA request for Nurtec. PA completed on Cover My Meds. Key: [id number]. Awaiting determination from [facility].

## 2021-03-13 ENCOUNTER — Ambulatory Visit
Admission: RE | Admit: 2021-03-13 | Discharge: 2021-03-13 | Disposition: A | Discharge status: Home / Self Care | Payer: 59 | Source: Ambulatory Visit | Attending: Family Medicine | Admitting: Family Medicine

## 2021-03-13 ENCOUNTER — Other Ambulatory Visit: Payer: Self-pay

## 2021-03-13 DIAGNOSIS — R928 Other abnormal and inconclusive findings on diagnostic imaging of breast: Secondary | ICD-10-CM

## 2021-05-01 ENCOUNTER — Telehealth: Payer: Self-pay | Admitting: Neurology

## 2021-05-01 NOTE — Telephone Encounter (Signed)
Patient's next Botox appointment is 11/9. I obtained PA for Botox via [employer] portal. PA was approved for 155 units every 12 weeks/G43.711. [place] [id number] (05/01/21- 05/01/22). Patient will continue to use [place].

## 2021-05-15 NOTE — Telephone Encounter (Signed)
Received (2) 100 unit vials of Botox today from [facility]. 

## 2021-05-22 ENCOUNTER — Ambulatory Visit: Payer: 59 | Admitting: Neurology

## 2021-07-04 ENCOUNTER — Ambulatory Visit (INDEPENDENT_AMBULATORY_CARE_PROVIDER_SITE_OTHER): Payer: 59 | Admitting: Neurology

## 2021-07-04 DIAGNOSIS — G43711 Chronic migraine without aura, intractable, with status migrainosus: Secondary | ICD-10-CM

## 2021-07-04 NOTE — Progress Notes (Signed)
Botox- 100 units x 2 vials Lot: [id number] Expiration: 03/2023 NDC: 6948-5462-70  Bacteriostatic 0.9% Sodium Chloride- 80mL total Lot: [id number] Expiration: 08/26/2022  NDC: 8182-9937-16  Dx:G43.711 S/P

## 2021-07-04 NOTE — Progress Notes (Signed)
Consent Form Botulism Toxin Injection For Chronic Migraine  07/04/2021: Doing exceptionally well! Has 4 kids. First daughter off to [place]. Twins are 47. she has been able to go 20 weeks without a migraine. Tremendous improvement. She has done tremendously well with NO MIGRAINES since last botox injection, she almost doesn't remember what it feels like to have headaches. Baseline headache frequency is daily headaches with 15 migrainous.  100% reduction in frequency and severity of headaches and migraines.   Consent Form Botulism Toxin Injection For Chronic Migraine    Reviewed orally with patient, additionally signature is on file:  Botulism toxin has been approved by the Federal drug administration for treatment of chronic migraine. Botulism toxin does not cure chronic migraine and it may not be effective in some patients.  The administration of botulism toxin is accomplished by injecting a small amount of toxin into the muscles of the neck and head. Dosage must be titrated for each individual. Any benefits resulting from botulism toxin tend to wear off after 3 months with a repeat injection required if benefit is to be maintained. Injections are usually done every 3-4 months with maximum effect peak achieved by about 2 or 3 weeks. Botulism toxin is expensive and you should be sure of what costs you will incur resulting from the injection.  The side effects of botulism toxin use for chronic migraine may include:   -Transient, and usually mild, facial weakness with facial injections  -Transient, and usually mild, head or neck weakness with head/neck injections  -Reduction or loss of forehead facial animation due to forehead muscle weakness  -Eyelid drooping  -Dry eye  -Pain at the site of injection or bruising at the site of injection  -Double vision  -Potential unknown long term risks  Contraindications: You should not have Botox if you are pregnant, nursing, allergic to albumin,  have an infection, skin condition, or muscle weakness at the site of the injection, or have myasthenia gravis, Lambert-Eaton syndrome, or ALS.  It is also possible that as with any injection, there may be an allergic reaction or no effect from the medication. Reduced effectiveness after repeated injections is sometimes seen and rarely infection at the injection site may occur. All care will be taken to prevent these side effects. If therapy is given over a long time, atrophy and wasting in the muscle injected may occur. Occasionally the patient's become refractory to treatment because they develop antibodies to the toxin. In this event, therapy needs to be modified.  I have read the above information and consent to the administration of botulism toxin.    BOTOX PROCEDURE NOTE FOR MIGRAINE HEADACHE    Contraindications and precautions discussed with patient(above). Aseptic procedure was observed and patient tolerated procedure. Procedure performed by Dr. Georgia Dom  The condition has existed for more than 6 months, and pt does not have a diagnosis of ALS, Myasthenia Gravis or Lambert-Eaton Syndrome.  Risks and benefits of injections discussed and pt agrees to proceed with the procedure.  Written consent obtained  These injections are medically necessary. Pt  receives good benefits from these injections. These injections do not cause sedations or hallucinations which the oral therapies may cause.  Description of procedure:  The patient was placed in a sitting position. The standard protocol was used for Botox as follows, with 5 units of Botox injected at each site:   -Procerus muscle, midline injection  -Corrugator muscle, bilateral injection  -Frontalis muscle, bilateral injection, with 2 sites each  side, medial injection was performed in the upper one third of the frontalis muscle, in the region vertical from the medial inferior edge of the superior orbital rim. The lateral injection was  again in the upper one third of the forehead vertically above the lateral limbus of the cornea, 1.5 cm lateral to the medial injection site.  -Temporalis muscle injection, 4 sites, bilaterally. The first injection was 3 cm above the tragus of the ear, second injection site was 1.5 cm to 3 cm up from the first injection site in line with the tragus of the ear. The third injection site was 1.5-3 cm forward between the first 2 injection sites. The fourth injection site was 1.5 cm posterior to the second injection site.   -Occipitalis muscle injection, 3 sites, bilaterally. The first injection was done one half way between the occipital protuberance and the tip of the mastoid process behind the ear. The second injection site was done lateral and superior to the first, 1 fingerbreadth from the first injection. The third injection site was 1 fingerbreadth superiorly and medially from the first injection site.  -Cervical paraspinal muscle injection, 2 sites, bilateral knee first injection site was 1 cm from the midline of the cervical spine, 3 cm inferior to the lower border of the occipital protuberance. The second injection site was 1.5 cm superiorly and laterally to the first injection site.  -Trapezius muscle injection was performed at 3 sites, bilaterally. The first injection site was in the upper trapezius muscle halfway between the inflection point of the neck, and the acromion. The second injection site was one half way between the acromion and the first injection site. The third injection was done between the first injection site and the inflection point of the neck.   Will return for repeat injection in 3 months.   155 units of Botox was used, 45u Botox not injected was wasted. The patient tolerated the procedure well, there were no complications of the above procedure.

## 2021-07-29 ENCOUNTER — Other Ambulatory Visit: Payer: Self-pay | Admitting: Neurology

## 2021-10-02 ENCOUNTER — Ambulatory Visit: Payer: 59 | Admitting: Neurology

## 2021-10-04 ENCOUNTER — Telehealth: Payer: Self-pay | Admitting: Neurology

## 2021-10-04 NOTE — Telephone Encounter (Signed)
I called [facility] spoke with Brandon(reference # [id number]) pharmacist to call in patients refill for Botox. [facility] will reach out to patient to get consent.

## 2021-10-07 ENCOUNTER — Other Ambulatory Visit: Payer: Self-pay

## 2021-10-07 ENCOUNTER — Emergency Department (INDEPENDENT_AMBULATORY_CARE_PROVIDER_SITE_OTHER)
Admission: EM | Admit: 2021-10-07 | Discharge: 2021-10-07 | Disposition: A | Discharge status: Home / Self Care | Payer: 59 | Source: Home / Self Care

## 2021-10-07 ENCOUNTER — Emergency Department (INDEPENDENT_AMBULATORY_CARE_PROVIDER_SITE_OTHER): Payer: 59

## 2021-10-07 DIAGNOSIS — S2242XB Multiple fractures of ribs, left side, initial encounter for open fracture: Secondary | ICD-10-CM | POA: Diagnosis not present

## 2021-10-07 DIAGNOSIS — M25532 Pain in left wrist: Secondary | ICD-10-CM

## 2021-10-07 DIAGNOSIS — R0789 Other chest pain: Secondary | ICD-10-CM

## 2021-10-07 DIAGNOSIS — W19XXXA Unspecified fall, initial encounter: Secondary | ICD-10-CM | POA: Diagnosis not present

## 2021-10-07 MED ORDER — OXYCODONE-ACETAMINOPHEN 5-325 MG PO TABS: 1.0000 | ORAL_TABLET | Freq: Three times a day (TID) | ORAL | 0 refills | Status: DC | PRN

## 2021-10-07 MED ORDER — IBUPROFEN 600 MG PO TABS: 600.0000 mg | ORAL_TABLET | Freq: Two times a day (BID) | ORAL | 0 refills | Status: DC | PRN

## 2021-10-07 NOTE — Discharge Instructions (Addendum)
Advised/informed patient of acute minimally displaced fractures involving lateral aspect of left ninth and 10th ribs.  Advised patient that left wrist was negative for acute osseous process.  Advised patient to repeat left-sided rib x-rays in the next 4 to 6 weeks to ensure healing of ninth and 10th ribs.  Advised patient to take medications as directed.  Encouraged patient to increase daily water intake while taking these medications.

## 2021-10-07 NOTE — ED Triage Notes (Signed)
Pt presents with left rib pain and left wrist pain after a fall that occurred yesterday morning.

## 2021-10-07 NOTE — ED Provider Notes (Signed)
[facility]    CSN: [id number] Arrival date & time: 10/07/21  1336      History   Chief Complaint Chief Complaint  Patient presents with   Fall    HPI Shari Booth is a 51 y.o. female.   HPI 51 year old female presents with left rib pain and left wrist pain secondary to fall that occurred yesterday morning at here home.  PMH significant for chronic migraine without aura and fibromyalgia.  Past Medical History:  Diagnosis Date   Anxiety    Asthma    Hx with pregnancy only - No inhaler- no current problems   Bell's palsy    at age 44 & age 40, no current problems   Common migraine with intractable migraine 07/29/2016   Depression    Fibromyalgia    IBS (irritable bowel syndrome)    diet controlled   Neuromuscular disorder ([place])    trigeminal nuralga - not currently active    Patient Active Problem List   Diagnosis Date Noted   Back pain 07/10/2018   Neck pain 07/10/2018   Symptomatic mammary hypertrophy 07/10/2018   Chronic migraine without aura, intractable, with status migrainosus 07/29/2016   S/P laparoscopic hysterectomy 06/12/2015    Past Surgical History:  Procedure Laterality Date   ABDOMINAL HYSTERECTOMY  2016   ABLATION     BREAST REDUCTION SURGERY Bilateral 08/12/2018   Procedure: BILATERAL MAMMARY REDUCTION  (BREAST);  Surgeon: Wallace Going, DO;  Location: [facility];  Service: Plastics;  Laterality: Bilateral;   ceserean section x 3     COLONOSCOPY     CYSTOSCOPY N/A 06/12/2015   Procedure: CYSTOSCOPY;  Surgeon: Aloha Gell, MD;  Location: [facility];  Service: Gynecology;  Laterality: N/A;   DILATION AND CURETTAGE OF UTERUS     LAPAROSCOPIC LYSIS OF ADHESIONS  01/12/2018   Procedure: LAPAROSCOPIC LYSIS OF ADHESIONS;  Surgeon: Aloha Gell, MD;  Location: [facility];  Service: Gynecology;;   LAPAROSCOPY N/A 01/12/2018   Procedure: Diagnostic Laparoscopy;  Surgeon: Aloha Gell, MD;  Location: [facility];  Service:  Gynecology;  Laterality: N/A;   REDUCTION MAMMAPLASTY Bilateral    ROBOTIC ASSISTED LAPAROSCOPIC LYSIS OF ADHESION  06/12/2015   Procedure: ROBOTIC ASSISTED LAPAROSCOPIC LYSIS OF ADHESION;  Surgeon: Aloha Gell, MD;  Location: [facility];  Service: Gynecology;;   WISDOM TOOTH EXTRACTION      OB History   No obstetric history on file.      Home Medications    Prior to Admission medications   Medication Sig Start Date End Date Taking? Authorizing Provider  ibuprofen (ADVIL) 600 MG tablet Take 1 tablet (600 mg total) by mouth 2 (two) times daily as needed. 10/07/21  Yes Eliezer Lofts, FNP  oxyCODONE-acetaminophen (PERCOCET/ROXICET) 5-325 MG tablet Take 1 tablet by mouth every 8 (eight) hours as needed for severe pain. 10/07/21  Yes Eliezer Lofts, FNP  ARIPiprazole (ABILIFY) 5 MG tablet Take 5 mg by mouth daily. 03/02/18   [provider]  botulinum toxin Type A (BOTOX) 100 units SOLR injection INJECT 155 UNITS  INTRAMUSCULARLY EVERY 3  MONTHS (GIVEN AT MD OFFICE, DISCARD UNUSED) 07/30/21   Melvenia Beam, MD  clonazePAM (KLONOPIN) 0.5 MG tablet Take 0.25 mg by mouth daily as needed for anxiety.     [provider]  cyclobenzaprine (FLEXERIL) 10 MG tablet Take 1 tablet (10 mg total) by mouth at bedtime. Patient not taking: Reported on 10/07/2021 12/07/19   Melvenia Beam, MD  desvenlafaxine ([place])  100 MG 24 hr tablet Take 100 mg by mouth daily.    [provider]  Fremanezumab-vfrm (AJOVY) 225 MG/1.5ML SOAJ Inject 225 mg into the skin every 30 (thirty) days. Patient has copay card Patient not taking: Reported on 10/07/2021 04/20/19   Melvenia Beam, MD  Multiple Vitamin (MULTIVITAMIN WITH MINERALS) TABS tablet Take 1 tablet by mouth daily.    [provider]  OnabotulinumtoxinA (BOTOX IJ) Inject as directed every 3 (three) months. Injection for migraines    [provider]  Rimegepant Sulfate (NURTEC) 75 MG TBDP Take 75 mg by mouth daily as  needed. For migraines. Take as close to onset of migraine as possible. One daily maximum. 10/10/20   Melvenia Beam, MD    Family History Family History  Problem Relation Age of Onset   High blood pressure Mother    Migraines Mother    High Cholesterol Father    Aortic stenosis Father    Arrhythmia Father        Pacemaker   Breast cancer Maternal Grandmother    Breast cancer Maternal Aunt    Lung cancer Maternal Aunt    Mental retardation Daughter     Social History Social History   Tobacco Use   Smoking status: Former    Packs/day: 0.25    Years: 20.00    Pack years: 5.00    Types: Cigarettes    Quit date: 08/26/2016    Years since quitting: 5.1   Smokeless tobacco: Never  Vaping Use   Vaping Use: Never used  Substance Use Topics   Alcohol use: Yes    Comment: 1-2 glasses per week   Drug use: No     Allergies   Topamax [topiramate] and Zonegran [zonisamide]   Review of Systems Review of Systems  Musculoskeletal:        Left-sided rib pain and left wrist pain for 1 day secondary to her fall at home    Physical Exam Triage Vital Signs ED Triage Vitals  Enc Vitals Group     BP 10/07/21 1344 117/83     Pulse Rate 10/07/21 1344 67     Resp 10/07/21 1344 14     Temp 10/07/21 1344 97.8 F (36.6 C)     Temp Source 10/07/21 1344 Oral     SpO2 10/07/21 1344 98 %     Weight 10/07/21 1346 180 lb (81.6 kg)     Height --      Head Circumference --      Peak Flow --      Pain Score 10/07/21 1346 5     Pain Loc --      Pain Edu? --      Excl. in [place]? --    No data found.  Updated Vital Signs BP 117/83 (BP Location: Right Arm)    Pulse 67    Temp 97.8 F (36.6 C) (Oral)    Resp 14    Wt 180 lb (81.6 kg)    SpO2 98%    BMI 30.42 kg/m    Physical Exam Vitals and nursing note reviewed.  Constitutional:      General: She is not in acute distress.    Appearance: Normal appearance. She is obese.  HENT:     Head: Normocephalic and atraumatic.      Mouth/Throat:     Mouth: Mucous membranes are moist.     Pharynx: Oropharynx is clear.  Eyes:     Extraocular Movements: Extraocular movements  intact.     Conjunctiva/sclera: Conjunctivae normal.     Pupils: Pupils are equal, round, and reactive to light.  Cardiovascular:     Rate and Rhythm: Normal rate and regular rhythm.     Pulses: Normal pulses.     Heart sounds: Normal heart sounds.  Pulmonary:     Effort: Pulmonary effort is normal.     Breath sounds: Normal breath sounds.  Musculoskeletal:     Cervical back: Normal range of motion and neck supple.     Comments: Left wrist (dorsum): TTP over distal radius and ulna, no deformity noted  Left-sided rib cage: TTP over left lateral 9th-11th rib, with mild soft tissue swelling noted  Skin:    General: Skin is warm and dry.  Neurological:     General: No focal deficit present.     Mental Status: She is alert and oriented to person, place, and time.     UC Treatments / Results  Labs (all labs ordered are listed, but only abnormal results are displayed) Labs Reviewed - No data to display  EKG   Radiology DG Ribs Unilateral W/Chest Left  Result Date: 10/07/2021 CLINICAL DATA:  Fall, left-sided chest pain EXAM: LEFT RIBS AND CHEST - 3+ VIEW COMPARISON:  12/27/2015 FINDINGS: Acute minimally displaced fractures involving the lateral aspect of the left ninth and tenth ribs. Left lung is clear. No pneumothorax. Heart size is normal. IMPRESSION: Acute minimally displaced fractures involving the lateral aspect of the left ninth and tenth ribs. No pneumothorax. Electronically Signed   By: Davina Poke D.O.   On: 10/07/2021 14:38   DG Wrist Complete Left  Result Date: 10/07/2021 CLINICAL DATA:  Fall, pain EXAM: LEFT WRIST - COMPLETE 3+ VIEW COMPARISON:  None. FINDINGS: There is no evidence of fracture or dislocation. There is no evidence of arthropathy or other focal bone abnormality. Soft tissues are unremarkable. IMPRESSION: No  fracture or dislocation of the left wrist. The carpus is normally aligned. Electronically Signed   By: Delanna Ahmadi M.D.   On: 10/07/2021 14:35    Procedures Procedures (including critical care time)  Medications Ordered in UC Medications - No data to display  Initial Impression / Assessment and Plan / UC Course  I have reviewed the triage vital signs and the nursing notes.  Pertinent labs & imaging results that were available during my care of the patient were reviewed by me and considered in my medical decision making (see chart for details).     MDM: 1.  Open fractures of multiple ribs of left side, initial encounter-Rx'd Percocet; 2.  Left wrist pain-Rx'd Ibuprofen 600 mg. Advised/informed patient of acute minimally displaced fractures involving lateral aspect of left ninth and 10th ribs.  Advised patient that left wrist was negative for acute osseous process.  Advised patient to repeat left-sided rib x-rays in the next 4 to 6 weeks to ensure healing of ninth and 10th ribs.  Advised patient to take medications as directed.  Encouraged patient to increase daily water intake while taking these medications. Final Clinical Impressions(s) / UC Diagnoses   Final diagnoses:  Open fracture of multiple ribs of left side, initial encounter  Left wrist pain     Discharge Instructions      Advised/informed patient of acute minimally displaced fractures involving lateral aspect of left ninth and 10th ribs.  Advised patient that left wrist was negative for acute osseous process.  Advised patient to repeat left-sided rib x-rays in the next 4 to 6  weeks to ensure healing of ninth and 10th ribs.  Advised patient to take medications as directed.  Encouraged patient to increase daily water intake while taking these medications.     ED Prescriptions     Medication Sig Dispense Auth. Provider   ibuprofen (ADVIL) 600 MG tablet Take 1 tablet (600 mg total) by mouth 2 (two) times daily as needed. 30  tablet Eliezer Lofts, FNP   oxyCODONE-acetaminophen (PERCOCET/ROXICET) 5-325 MG tablet Take 1 tablet by mouth every 8 (eight) hours as needed for severe pain. 30 tablet Eliezer Lofts, FNP      I have reviewed the PDMP during this encounter.   Eliezer Lofts, [place] 10/07/21 1513

## 2021-10-22 MED ORDER — BOTOX 100 UNITS IJ SOLR: INTRAMUSCULAR | 1 refills | Status: DC

## 2021-10-22 NOTE — Addendum Note (Signed)
Addended by: Gildardo Griffes on: 10/22/2021 10:48 AM   Modules accepted: Orders

## 2021-10-22 NOTE — Telephone Encounter (Signed)
Please send Botox refill to [facility]. °

## 2021-10-25 NOTE — Telephone Encounter (Signed)
Received (2) 100 unit vials of Botox from [facility]. ?

## 2021-10-30 ENCOUNTER — Other Ambulatory Visit: Payer: Self-pay

## 2021-10-30 ENCOUNTER — Ambulatory Visit (INDEPENDENT_AMBULATORY_CARE_PROVIDER_SITE_OTHER): Payer: 59 | Admitting: Neurology

## 2021-10-30 DIAGNOSIS — G43711 Chronic migraine without aura, intractable, with status migrainosus: Secondary | ICD-10-CM | POA: Diagnosis not present

## 2021-10-30 NOTE — Progress Notes (Signed)
Botox- 100 units x 2 vials ?Lot: [id number] ?Expiration: 12/2023 ?[place]: [id number] ? ?Bacteriostatic 0.9% Sodium Chloride- 89m total ?Lot: [id number]?Ex[place]: 03/27/2023 ?[place] [id number]? ?Dx: GZ89.483?S/P  ? ?

## 2021-10-30 NOTE — Progress Notes (Signed)
? ?Consent Form ?Botulism Toxin Injection For Chronic Migraine ? ?10/30/2021: Place frontalis higher in the forehead (she had unequal brows so we place it high near hairline) we also put some behind the right ear (ask her) with the "follow the pain" protocol. +a.  She has stopped taking the Ajovy and still doing well. We can push out botox to 16-20 weeks and see how she does.  try nurtec.   ? ?07/04/2021: Doing exceptionally well! Has 4 kids. First daughter off to [place]. Twins are 15. she has been able to go 20 weeks without a migraine. Tremendous improvement. She has done tremendously well with NO MIGRAINES since last botox injection, she almost doesn't remember what it feels like to have headaches. Baseline headache frequency is daily headaches with 15 migrainous.  100% reduction in frequency and severity of headaches and migraines.  ? ?Consent Form ?Botulism Toxin Injection For Chronic Migraine ? ? ? ?Reviewed orally with patient, additionally signature is on file: ? ?Botulism toxin has been approved by the Federal drug administration for treatment of chronic migraine. Botulism toxin does not cure chronic migraine and it may not be effective in some patients. ? ?The administration of botulism toxin is accomplished by injecting a small amount of toxin into the muscles of the neck and head. Dosage must be titrated for each individual. Any benefits resulting from botulism toxin tend to wear off after 3 months with a repeat injection required if benefit is to be maintained. Injections are usually done every 3-4 months with maximum effect peak achieved by about 2 or 3 weeks. Botulism toxin is expensive and you should be sure of what costs you will incur resulting from the injection. ? ?The side effects of botulism toxin use for chronic migraine may include: ? ? -Transient, and usually mild, facial weakness with facial injections ? -Transient, and usually mild, head or neck weakness with head/neck  injections ? -Reduction or loss of forehead facial animation due to forehead muscle weakness ? -Eyelid drooping ? -Dry eye ? -Pain at the site of injection or bruising at the site of injection ? -Double vision ? -Potential unknown long term risks ? ?Contraindications: You should not have Botox if you are pregnant, nursing, allergic to albumin, have an infection, skin condition, or muscle weakness at the site of the injection, or have myasthenia gravis, Lambert-Eaton syndrome, or ALS. ? ?It is also possible that as with any injection, there may be an allergic reaction or no effect from the medication. Reduced effectiveness after repeated injections is sometimes seen and rarely infection at the injection site may occur. All care will be taken to prevent these side effects. If therapy is given over a long time, atrophy and wasting in the muscle injected may occur. Occasionally the patient's become refractory to treatment because they develop antibodies to the toxin. In this event, therapy needs to be modified. ? ?I have read the above information and consent to the administration of botulism toxin. ? ? ? ?BOTOX PROCEDURE NOTE FOR MIGRAINE HEADACHE ? ? ? ?Contraindications and precautions discussed with patient(above). Aseptic procedure was observed and patient tolerated procedure. Procedure performed by Dr. Georgia Dom ? ?The condition has existed for more than 6 months, and pt does not have a diagnosis of ALS, Myasthenia Gravis or Lambert-Eaton Syndrome.  Risks and benefits of injections discussed and pt agrees to proceed with the procedure.  Written consent obtained ? ?These injections are medically necessary. Pt  receives good benefits from these injections.  These injections do not cause sedations or hallucinations which the oral therapies may cause. ? ?Description of procedure: ? ?The patient was placed in a sitting position. The standard protocol was used for Botox as follows, with 5 units of Botox injected at each  site: ? ? ?-Procerus muscle, midline injection ? ?-Corrugator muscle, bilateral injection ? ?-Frontalis muscle, bilateral injection, with 2 sites each side, medial injection was performed in the upper one third of the frontalis muscle, in the region vertical from the medial inferior edge of the superior orbital rim. The lateral injection was again in the upper one third of the forehead vertically above the lateral limbus of the cornea, 1.5 cm lateral to the medial injection site. ? ?-Temporalis muscle injection, 4 sites, bilaterally. The first injection was 3 cm above the tragus of the ear, second injection site was 1.5 cm to 3 cm up from the first injection site in line with the tragus of the ear. The third injection site was 1.5-3 cm forward between the first 2 injection sites. The fourth injection site was 1.5 cm posterior to the second injection site.  ? ?-Occipitalis muscle injection, 3 sites, bilaterally. The first injection was done one half way between the occipital protuberance and the tip of the mastoid process behind the ear. The second injection site was done lateral and superior to the first, 1 fingerbreadth from the first injection. The third injection site was 1 fingerbreadth superiorly and medially from the first injection site. ? ?-Cervical paraspinal muscle injection, 2 sites, bilateral knee first injection site was 1 cm from the midline of the cervical spine, 3 cm inferior to the lower border of the occipital protuberance. The second injection site was 1.5 cm superiorly and laterally to the first injection site. ? ?-Trapezius muscle injection was performed at 3 sites, bilaterally. The first injection site was in the upper trapezius muscle halfway between the inflection point of the neck, and the acromion. The second injection site was one half way between the acromion and the first injection site. The third injection was done between the first injection site and the inflection point of the  neck. ? ? ?Will return for repeat injection in 3 months. ? ? ?155 units of Botox was used, 45u Botox not injected was wasted. The patient tolerated the procedure well, there were no complications of the above procedure. ? ? ? ?

## 2022-01-22 ENCOUNTER — Ambulatory Visit: Payer: 59 | Admitting: Neurology

## 2022-02-06 ENCOUNTER — Encounter: Payer: Self-pay | Admitting: *Deleted

## 2022-02-06 NOTE — Telephone Encounter (Signed)
Completed Nurtec PA on Cover My Meds. Key: [id number]. Awaiting determination from [facility].

## 2022-02-07 NOTE — Telephone Encounter (Signed)
Approved on June 14 Request Reference Number: [id number]. NURTEC TAB '75MG'$  ODT is approved through 02/07/2023. Your patient may now fill this prescription and it will be covered.

## 2022-02-25 ENCOUNTER — Other Ambulatory Visit: Payer: Self-pay | Admitting: Family Medicine

## 2022-02-25 DIAGNOSIS — Z1231 Encounter for screening mammogram for malignant neoplasm of breast: Secondary | ICD-10-CM

## 2022-03-01 ENCOUNTER — Ambulatory Visit
Admission: RE | Admit: 2022-03-01 | Discharge: 2022-03-01 | Disposition: A | Discharge status: Home / Self Care | Payer: 59 | Source: Ambulatory Visit | Attending: Family Medicine | Admitting: Family Medicine

## 2022-03-01 DIAGNOSIS — Z1231 Encounter for screening mammogram for malignant neoplasm of breast: Secondary | ICD-10-CM

## 2022-03-19 ENCOUNTER — Ambulatory Visit (INDEPENDENT_AMBULATORY_CARE_PROVIDER_SITE_OTHER): Payer: 59 | Admitting: Neurology

## 2022-03-19 DIAGNOSIS — G43711 Chronic migraine without aura, intractable, with status migrainosus: Secondary | ICD-10-CM

## 2022-03-19 MED ORDER — ONABOTULINUMTOXINA 100 UNITS IJ SOLR
100.0000 [IU] | Freq: Once | INTRAMUSCULAR | Status: AC
  Administered 2022-03-19: 100 [IU] via INTRAMUSCULAR

## 2022-03-19 NOTE — Telephone Encounter (Signed)
Received (2) 100 unit vials of Botox from [facility].

## 2022-03-19 NOTE — Patient Instructions (Signed)
Consent Form Botulism Toxin Injection For Chronic Migraine  03/19/2022: Place frontalis higher in the forehead (she had unequal brows so we place it high near hairline) we also put some behind the right ear (ask her) with the "follow the pain" protocol. +a.  She has stopped taking the Ajovy and still doing well. We can push out botox to 16-20 weeks and see how she does.  try nurtec.    10/30/2021: Place frontalis higher in the forehead (she had unequal brows so we place it high near hairline) we also put some behind the right ear (ask her) with the "follow the pain" protocol. +a.  She has stopped taking the Ajovy and still doing well. We can push out botox to 16-20 weeks and see how she does.  try nurtec.    07/04/2021: Doing exceptionally well! Has 4 kids. First daughter off to [place]. Twins are 70. she has been able to go 20 weeks without a migraine. Tremendous improvement. She has done tremendously well with NO MIGRAINES since last botox injection, she almost doesn't remember what it feels like to have headaches. Baseline headache frequency is daily headaches with 15 migrainous.  100% reduction in frequency and severity of headaches and migraines.   Consent Form Botulism Toxin Injection For Chronic Migraine    Reviewed orally with patient, additionally signature is on file:  Botulism toxin has been approved by the Federal drug administration for treatment of chronic migraine. Botulism toxin does not cure chronic migraine and it may not be effective in some patients.  The administration of botulism toxin is accomplished by injecting a small amount of toxin into the muscles of the neck and head. Dosage must be titrated for each individual. Any benefits resulting from botulism toxin tend to wear off after 3 months with a repeat injection required if benefit is to be maintained. Injections are usually done every 3-4 months with maximum effect peak achieved by about 2 or 3 weeks. Botulism toxin  is expensive and you should be sure of what costs you will incur resulting from the injection.  The side effects of botulism toxin use for chronic migraine may include:   -Transient, and usually mild, facial weakness with facial injections  -Transient, and usually mild, head or neck weakness with head/neck injections  -Reduction or loss of forehead facial animation due to forehead muscle weakness  -Eyelid drooping  -Dry eye  -Pain at the site of injection or bruising at the site of injection  -Double vision  -Potential unknown long term risks  Contraindications: You should not have Botox if you are pregnant, nursing, allergic to albumin, have an infection, skin condition, or muscle weakness at the site of the injection, or have myasthenia gravis, Lambert-Eaton syndrome, or ALS.  It is also possible that as with any injection, there may be an allergic reaction or no effect from the medication. Reduced effectiveness after repeated injections is sometimes seen and rarely infection at the injection site may occur. All care will be taken to prevent these side effects. If therapy is given over a long time, atrophy and wasting in the muscle injected may occur. Occasionally the patient's become refractory to treatment because they develop antibodies to the toxin. In this event, therapy needs to be modified.  I have read the above information and consent to the administration of botulism toxin.    BOTOX PROCEDURE NOTE FOR MIGRAINE HEADACHE    Contraindications and precautions discussed with patient(above). Aseptic procedure was observed and patient  tolerated procedure. Procedure performed by Dr. Georgia Dom  The condition has existed for more than 6 months, and pt does not have a diagnosis of ALS, Myasthenia Gravis or Lambert-Eaton Syndrome.  Risks and benefits of injections discussed and pt agrees to proceed with the procedure.  Written consent obtained  These injections are medically necessary.  Pt  receives good benefits from these injections. These injections do not cause sedations or hallucinations which the oral therapies may cause.  Description of procedure:  The patient was placed in a sitting position. The standard protocol was used for Botox as follows, with 5 units of Botox injected at each site:   -Procerus muscle, midline injection  -Corrugator muscle, bilateral injection  -Frontalis muscle, bilateral injection, with 2 sites each side, medial injection was performed in the upper one third of the frontalis muscle, in the region vertical from the medial inferior edge of the superior orbital rim. The lateral injection was again in the upper one third of the forehead vertically above the lateral limbus of the cornea, 1.5 cm lateral to the medial injection site.  -Temporalis muscle injection, 4 sites, bilaterally. The first injection was 3 cm above the tragus of the ear, second injection site was 1.5 cm to 3 cm up from the first injection site in line with the tragus of the ear. The third injection site was 1.5-3 cm forward between the first 2 injection sites. The fourth injection site was 1.5 cm posterior to the second injection site.   -Occipitalis muscle injection, 3 sites, bilaterally. The first injection was done one half way between the occipital protuberance and the tip of the mastoid process behind the ear. The second injection site was done lateral and superior to the first, 1 fingerbreadth from the first injection. The third injection site was 1 fingerbreadth superiorly and medially from the first injection site.  -Cervical paraspinal muscle injection, 2 sites, bilateral knee first injection site was 1 cm from the midline of the cervical spine, 3 cm inferior to the lower border of the occipital protuberance. The second injection site was 1.5 cm superiorly and laterally to the first injection site.  -Trapezius muscle injection was performed at 3 sites, bilaterally. The  first injection site was in the upper trapezius muscle halfway between the inflection point of the neck, and the acromion. The second injection site was one half way between the acromion and the first injection site. The third injection was done between the first injection site and the inflection point of the neck.   Will return for repeat injection in 3 months.   155 units of Botox was used, 45u Botox not injected was wasted. The patient tolerated the procedure well, there were no complications of the above procedure.

## 2022-03-19 NOTE — Progress Notes (Signed)
Botox- 100 units x 2 vials Lot: [id number] Expiration: 06/2024 NDC: 3979-5369-22  Bacteriostatic 0.9% Sodium Chloride- 68m total Lot: [id number] Expiration: 03/27/2023 NDC: [address]-97 Dx: [id number]/P

## 2022-03-20 ENCOUNTER — Other Ambulatory Visit: Payer: Self-pay | Admitting: Neurology

## 2022-03-25 ENCOUNTER — Telehealth: Payer: Self-pay | Admitting: Neurology

## 2022-03-25 NOTE — Telephone Encounter (Signed)
Pt would like a call from the Botox Coordinator to reschedule  October appt in December for the afternoon.

## 2022-03-25 NOTE — Progress Notes (Signed)
Consent Form Botulism Toxin Injection For Chronic Migraine 03/25/2022: doing great, >> 50% improvement in migraine freq and severity 10/30/2021: Place frontalis higher in the forehead (she had unequal brows so we place it high near hairline) we also put some behind the right ear (ask her) with the "follow the pain" protocol. +a.  She has stopped taking the Ajovy and still doing well. We can push out botox to 16-20 weeks and see how she does.  try nurtec.    07/04/2021: Doing exceptionally well! Has 4 kids. First daughter off to [place]. Twins are 76. she has been able to go 20 weeks without a migraine. Tremendous improvement. She has done tremendously well with NO MIGRAINES since last botox injection, she almost doesn't remember what it feels like to have headaches. Baseline headache frequency is daily headaches with 15 migrainous.  100% reduction in frequency and severity of headaches and migraines.   Consent Form Botulism Toxin Injection For Chronic Migraine    Reviewed orally with patient, additionally signature is on file:  Botulism toxin has been approved by the Federal drug administration for treatment of chronic migraine. Botulism toxin does not cure chronic migraine and it may not be effective in some patients.  The administration of botulism toxin is accomplished by injecting a small amount of toxin into the muscles of the neck and head. Dosage must be titrated for each individual. Any benefits resulting from botulism toxin tend to wear off after 3 months with a repeat injection required if benefit is to be maintained. Injections are usually done every 3-4 months with maximum effect peak achieved by about 2 or 3 weeks. Botulism toxin is expensive and you should be sure of what costs you will incur resulting from the injection.  The side effects of botulism toxin use for chronic migraine may include:   -Transient, and usually mild, facial weakness with facial injections  -Transient,  and usually mild, head or neck weakness with head/neck injections  -Reduction or loss of forehead facial animation due to forehead muscle weakness  -Eyelid drooping  -Dry eye  -Pain at the site of injection or bruising at the site of injection  -Double vision  -Potential unknown long term risks  Contraindications: You should not have Botox if you are pregnant, nursing, allergic to albumin, have an infection, skin condition, or muscle weakness at the site of the injection, or have myasthenia gravis, Lambert-Eaton syndrome, or ALS.  It is also possible that as with any injection, there may be an allergic reaction or no effect from the medication. Reduced effectiveness after repeated injections is sometimes seen and rarely infection at the injection site may occur. All care will be taken to prevent these side effects. If therapy is given over a long time, atrophy and wasting in the muscle injected may occur. Occasionally the patient's become refractory to treatment because they develop antibodies to the toxin. In this event, therapy needs to be modified.  I have read the above information and consent to the administration of botulism toxin.    BOTOX PROCEDURE NOTE FOR MIGRAINE HEADACHE    Contraindications and precautions discussed with patient(above). Aseptic procedure was observed and patient tolerated procedure. Procedure performed by Dr. Georgia Dom  The condition has existed for more than 6 months, and pt does not have a diagnosis of ALS, Myasthenia Gravis or Lambert-Eaton Syndrome.  Risks and benefits of injections discussed and pt agrees to proceed with the procedure.  Written consent obtained  These injections are  medically necessary. Pt  receives good benefits from these injections. These injections do not cause sedations or hallucinations which the oral therapies may cause.  Description of procedure:  The patient was placed in a sitting position. The standard protocol was used for  Botox as follows, with 5 units of Botox injected at each site:   -Procerus muscle, midline injection  -Corrugator muscle, bilateral injection  -Frontalis muscle, bilateral injection, with 2 sites each side, medial injection was performed in the upper one third of the frontalis muscle, in the region vertical from the medial inferior edge of the superior orbital rim. The lateral injection was again in the upper one third of the forehead vertically above the lateral limbus of the cornea, 1.5 cm lateral to the medial injection site.  -Temporalis muscle injection, 4 sites, bilaterally. The first injection was 3 cm above the tragus of the ear, second injection site was 1.5 cm to 3 cm up from the first injection site in line with the tragus of the ear. The third injection site was 1.5-3 cm forward between the first 2 injection sites. The fourth injection site was 1.5 cm posterior to the second injection site.   -Occipitalis muscle injection, 3 sites, bilaterally. The first injection was done one half way between the occipital protuberance and the tip of the mastoid process behind the ear. The second injection site was done lateral and superior to the first, 1 fingerbreadth from the first injection. The third injection site was 1 fingerbreadth superiorly and medially from the first injection site.  -Cervical paraspinal muscle injection, 2 sites, bilateral knee first injection site was 1 cm from the midline of the cervical spine, 3 cm inferior to the lower border of the occipital protuberance. The second injection site was 1.5 cm superiorly and laterally to the first injection site.  -Trapezius muscle injection was performed at 3 sites, bilaterally. The first injection site was in the upper trapezius muscle halfway between the inflection point of the neck, and the acromion. The second injection site was one half way between the acromion and the first injection site. The third injection was done between the  first injection site and the inflection point of the neck.   Will return for repeat injection in 3 months.   155 units of Botox was used, 45u Botox not injected was wasted. The patient tolerated the procedure well, there were no complications of the above procedure.

## 2022-03-26 NOTE — Telephone Encounter (Signed)
I called the patient back and lvm telling her I would cancel her October appt for now and she can call us back to schedule for December when she is ready.

## 2022-04-15 ENCOUNTER — Telehealth: Payer: Self-pay | Admitting: Neurology

## 2022-04-15 NOTE — Telephone Encounter (Signed)
LVM and sent mychart msg informing pt of appointment change from 10/17 to 10/19 due to provider being out

## 2022-04-16 ENCOUNTER — Telehealth: Payer: Self-pay | Admitting: Neurology

## 2022-04-16 NOTE — Telephone Encounter (Signed)
Received mychart msg from pt asking to reschedule 10/19 botox appointment, was unable to reach pt over the phone to reschedule. LVM asking pt to call back

## 2022-06-03 ENCOUNTER — Telehealth: Payer: Self-pay | Admitting: Neurology

## 2022-06-03 NOTE — Telephone Encounter (Signed)
I called [employer] and gave the ICD 10 code for the botox G43.711.  She will update and verify benefits for 155units BOTOX every 3 months.  Appreciated call back.

## 2022-06-03 NOTE — Telephone Encounter (Signed)
[facility] Alinda [place]err[place]) calling to verity ICD-10 Code for Botox. Would like a call back.

## 2022-06-11 ENCOUNTER — Ambulatory Visit: Payer: 59 | Admitting: Neurology

## 2022-06-13 ENCOUNTER — Ambulatory Visit: Payer: 59 | Admitting: Neurology

## 2022-07-30 ENCOUNTER — Telehealth: Payer: Self-pay | Admitting: Neurology

## 2022-07-30 MED ORDER — ONABOTULINUMTOXINA 200 UNITS IJ SOLR: 155.0000 [IU] | INTRAMUSCULAR | 3 refills | Status: DC

## 2022-07-30 NOTE — Addendum Note (Signed)
Addended by: Brandon Melnick on: 07/30/2022 03:41 PM   Modules accepted: Orders

## 2022-07-30 NOTE — Telephone Encounter (Signed)
Pt will need new botox PA before appointment scheduled for 08/06/22. Prior PA expired on 05/01/22.

## 2022-07-30 NOTE — Telephone Encounter (Signed)
PA Approved Via [facility] Portal- [id number]- 07/30/22-07/31/23. Servicing provider- [facility]

## 2022-07-30 NOTE — Telephone Encounter (Signed)
Has appointment 08-06-2022 for BOTOX.    Chronic Migraine   CPT 64615  Botox J0585 Units: 200 units  G43.711 Chronic Migraine without aura, intractable, with status migrainous

## 2022-08-06 ENCOUNTER — Other Ambulatory Visit: Payer: Self-pay | Admitting: Neurology

## 2022-08-06 ENCOUNTER — Ambulatory Visit (INDEPENDENT_AMBULATORY_CARE_PROVIDER_SITE_OTHER): Payer: 59 | Admitting: Neurology

## 2022-08-06 DIAGNOSIS — E669 Obesity, unspecified: Secondary | ICD-10-CM

## 2022-08-06 DIAGNOSIS — G43711 Chronic migraine without aura, intractable, with status migrainosus: Secondary | ICD-10-CM | POA: Diagnosis not present

## 2022-08-06 MED ORDER — WEGOVY 2.4 MG/0.75ML ~~LOC~~ SOAJ: 2.4000 mg | SUBCUTANEOUS | 3 refills | Status: DC

## 2022-08-06 MED ORDER — ONABOTULINUMTOXINA 100 UNITS IJ SOLR
155.0000 [IU] | Freq: Once | INTRAMUSCULAR | Status: AC
  Administered 2022-08-06: 155 [IU] via INTRAMUSCULAR

## 2022-08-06 NOTE — Progress Notes (Signed)
Botox- 100 units x 2 vials Lot: [id number] Expiration: 07/2024 NDC: 7903-8333-83  Bacteriostatic 0.9% Sodium Chloride- 41m total Lot: [id number]: 04/27/2023 NDC: [address]-97 Dx: [id number]/P

## 2022-08-06 NOTE — Patient Instructions (Addendum)
Consent Form  185 5"4' Tried metformin She has tried [employer], [employer], nutrisystem for > 6 months formal plan and not been able to lose 5% of her body weight , failed metformin, BMI> 31  Botulism Toxin Injection For Chronic Migraine 03/25/2022: doing great, >> 50% improvement in migraine freq and severity 10/30/2021: Place frontalis higher in the forehead (she had unequal brows so we place it high near hairline) we also put some behind the right ear (ask her) with the "follow the pain" protocol. +a.  She has stopped taking the Ajovy and still doing well. We can push out botox to 16-20 weeks and see how she does.  try nurtec.    07/04/2021: Doing exceptionally well! Has 4 kids. First daughter off to [place]. Twins are 92. she has been able to go 20 weeks without a migraine. Tremendous improvement. She has done tremendously well with NO MIGRAINES since last botox injection, she almost doesn't remember what it feels like to have headaches. Baseline headache frequency is daily headaches with 15 migrainous.  100% reduction in frequency and severity of headaches and migraines.   Consent Form Botulism Toxin Injection For Chronic Migraine    Reviewed orally with patient, additionally signature is on file:  Botulism toxin has been approved by the Federal drug administration for treatment of chronic migraine. Botulism toxin does not cure chronic migraine and it may not be effective in some patients.  The administration of botulism toxin is accomplished by injecting a small amount of toxin into the muscles of the neck and head. Dosage must be titrated for each individual. Any benefits resulting from botulism toxin tend to wear off after 3 months with a repeat injection required if benefit is to be maintained. Injections are usually done every 3-4 months with maximum effect peak achieved by about 2 or 3 weeks. Botulism toxin is expensive and you should be sure of what costs you will incur  resulting from the injection.  The side effects of botulism toxin use for chronic migraine may include:   -Transient, and usually mild, facial weakness with facial injections  -Transient, and usually mild, head or neck weakness with head/neck injections  -Reduction or loss of forehead facial animation due to forehead muscle weakness  -Eyelid drooping  -Dry eye  -Pain at the site of injection or bruising at the site of injection  -Double vision  -Potential unknown long term risks  Contraindications: You should not have Botox if you are pregnant, nursing, allergic to albumin, have an infection, skin condition, or muscle weakness at the site of the injection, or have myasthenia gravis, Lambert-Eaton syndrome, or ALS.  It is also possible that as with any injection, there may be an allergic reaction or no effect from the medication. Reduced effectiveness after repeated injections is sometimes seen and rarely infection at the injection site may occur. All care will be taken to prevent these side effects. If therapy is given over a long time, atrophy and wasting in the muscle injected may occur. Occasionally the patient's become refractory to treatment because they develop antibodies to the toxin. In this event, therapy needs to be modified.  I have read the above information and consent to the administration of botulism toxin.    BOTOX PROCEDURE NOTE FOR MIGRAINE HEADACHE    Contraindications and precautions discussed with patient(above). Aseptic procedure was observed and patient tolerated procedure. Procedure performed by Dr. Georgia Dom  The condition has existed for more than 6 months, and pt  does not have a diagnosis of ALS, Myasthenia Gravis or Lambert-Eaton Syndrome.  Risks and benefits of injections discussed and pt agrees to proceed with the procedure.  Written consent obtained  These injections are medically necessary. Pt  receives good benefits from these injections. These  injections do not cause sedations or hallucinations which the oral therapies may cause.  Description of procedure:  The patient was placed in a sitting position. The standard protocol was used for Botox as follows, with 5 units of Botox injected at each site:   -Procerus muscle, midline injection  -Corrugator muscle, bilateral injection  -Frontalis muscle, bilateral injection, with 2 sites each side, medial injection was performed in the upper one third of the frontalis muscle, in the region vertical from the medial inferior edge of the superior orbital rim. The lateral injection was again in the upper one third of the forehead vertically above the lateral limbus of the cornea, 1.5 cm lateral to the medial injection site.  -Temporalis muscle injection, 4 sites, bilaterally. The first injection was 3 cm above the tragus of the ear, second injection site was 1.5 cm to 3 cm up from the first injection site in line with the tragus of the ear. The third injection site was 1.5-3 cm forward between the first 2 injection sites. The fourth injection site was 1.5 cm posterior to the second injection site.   -Occipitalis muscle injection, 3 sites, bilaterally. The first injection was done one half way between the occipital protuberance and the tip of the mastoid process behind the ear. The second injection site was done lateral and superior to the first, 1 fingerbreadth from the first injection. The third injection site was 1 fingerbreadth superiorly and medially from the first injection site.  -Cervical paraspinal muscle injection, 2 sites, bilateral knee first injection site was 1 cm from the midline of the cervical spine, 3 cm inferior to the lower border of the occipital protuberance. The second injection site was 1.5 cm superiorly and laterally to the first injection site.  -Trapezius muscle injection was performed at 3 sites, bilaterally. The first injection site was in the upper trapezius muscle  halfway between the inflection point of the neck, and the acromion. The second injection site was one half way between the acromion and the first injection site. The third injection was done between the first injection site and the inflection point of the neck.   Will return for repeat injection in 3 months.   155 units of Botox was used, 45u Botox not injected was wasted. The patient tolerated the procedure well, there were no complications of the above procedure.

## 2022-08-20 ENCOUNTER — Encounter: Payer: Self-pay | Admitting: Neurology

## 2022-08-30 ENCOUNTER — Encounter: Payer: Self-pay | Admitting: Neurology

## 2022-08-30 DIAGNOSIS — E669 Obesity, unspecified: Secondary | ICD-10-CM

## 2022-09-02 MED ORDER — WEGOVY 2.4 MG/0.75ML ~~LOC~~ SOAJ: 2.4000 mg | SUBCUTANEOUS | 2 refills | Status: DC

## 2022-11-06 ENCOUNTER — Other Ambulatory Visit (HOSPITAL_BASED_OUTPATIENT_CLINIC_OR_DEPARTMENT_OTHER): Payer: Self-pay | Admitting: Pediatrics

## 2022-11-06 ENCOUNTER — Ambulatory Visit (HOSPITAL_BASED_OUTPATIENT_CLINIC_OR_DEPARTMENT_OTHER)
Admission: RE | Admit: 2022-11-06 | Discharge: 2022-11-06 | Disposition: A | Discharge status: Home / Self Care | Payer: 59 | Source: Ambulatory Visit | Attending: Pediatrics | Admitting: Pediatrics

## 2022-11-06 DIAGNOSIS — N9489 Other specified conditions associated with female genital organs and menstrual cycle: Secondary | ICD-10-CM | POA: Diagnosis present

## 2022-11-06 LAB — COMPREHENSIVE METABOLIC PANEL WITH GFR: EGFR: 95

## 2022-11-06 MED ORDER — IOHEXOL 300 MG/ML  SOLN
100.0000 mL | Freq: Once | INTRAMUSCULAR | Status: AC | PRN
  Administered 2022-11-06: 100 mL via INTRAVENOUS

## 2022-11-07 ENCOUNTER — Ambulatory Visit (HOSPITAL_BASED_OUTPATIENT_CLINIC_OR_DEPARTMENT_OTHER): Payer: 59

## 2022-11-12 ENCOUNTER — Emergency Department (HOSPITAL_BASED_OUTPATIENT_CLINIC_OR_DEPARTMENT_OTHER): Payer: 59

## 2022-11-12 ENCOUNTER — Other Ambulatory Visit: Payer: Self-pay

## 2022-11-12 ENCOUNTER — Emergency Department (HOSPITAL_BASED_OUTPATIENT_CLINIC_OR_DEPARTMENT_OTHER)
Admission: EM | Admit: 2022-11-12 | Discharge: 2022-11-12 | Disposition: A | Discharge status: Home / Self Care | Payer: 59 | Attending: Emergency Medicine | Admitting: Emergency Medicine

## 2022-11-12 ENCOUNTER — Encounter (HOSPITAL_BASED_OUTPATIENT_CLINIC_OR_DEPARTMENT_OTHER): Payer: Self-pay | Admitting: Emergency Medicine

## 2022-11-12 DIAGNOSIS — E871 Hypo-osmolality and hyponatremia: Secondary | ICD-10-CM | POA: Insufficient documentation

## 2022-11-12 DIAGNOSIS — R350 Frequency of micturition: Secondary | ICD-10-CM | POA: Diagnosis not present

## 2022-11-12 DIAGNOSIS — K5900 Constipation, unspecified: Secondary | ICD-10-CM | POA: Diagnosis not present

## 2022-11-12 DIAGNOSIS — R102 Pelvic and perineal pain: Secondary | ICD-10-CM | POA: Diagnosis present

## 2022-11-12 LAB — URINALYSIS, ROUTINE W REFLEX MICROSCOPIC
Bilirubin Urine: NEGATIVE
Glucose, UA: NEGATIVE mg/dL
Ketones, ur: NEGATIVE mg/dL
Leukocytes,Ua: NEGATIVE
Nitrite: NEGATIVE
Protein, ur: NEGATIVE mg/dL
Specific Gravity, Urine: 1.015 (ref 1.005–1.030)
pH: 6 (ref 5.0–8.0)

## 2022-11-12 LAB — PREGNANCY, URINE: Preg Test, Ur: NEGATIVE

## 2022-11-12 LAB — COMPREHENSIVE METABOLIC PANEL
ALT: 12 U/L (ref 0–44)
AST: 16 U/L (ref 15–41)
Albumin: 3.6 g/dL (ref 3.5–5.0)
Alkaline Phosphatase: 62 U/L (ref 38–126)
Anion gap: 5 (ref 5–15)
BUN: 10 mg/dL (ref 6–20)
CO2: 24 mmol/L (ref 22–32)
Calcium: 8.1 mg/dL — ABNORMAL LOW (ref 8.9–10.3)
Chloride: 103 mmol/L (ref 98–111)
Creatinine, Ser: 0.99 mg/dL (ref 0.44–1.00)
GFR, Estimated: >60 mL/min (ref >60)
Glucose, Bld: 83 mg/dL (ref 70–99)
Potassium: 3.7 mmol/L (ref 3.5–5.1)
Sodium: 132 mmol/L — ABNORMAL LOW (ref 135–145)
Total Bilirubin: 0.4 mg/dL (ref 0.3–1.2)
Total Protein: 6.8 g/dL (ref 6.5–8.1)

## 2022-11-12 LAB — CBC
HCT: 41.3 % (ref 36.0–46.0)
Hemoglobin: 13.8 g/dL (ref 12.0–15.0)
MCH: 29.2 pg (ref 26.0–34.0)
MCHC: 33.4 g/dL (ref 30.0–36.0)
MCV: 87.3 fL (ref 80.0–100.0)
Platelets: 228 10*3/uL (ref 150–400)
RBC: 4.73 MIL/uL (ref 3.87–5.11)
RDW: 12.7 % (ref 11.5–15.5)
WBC: 4.9 10*3/uL (ref 4.0–10.5)
nRBC: 0 % (ref 0.0–0.2)

## 2022-11-12 LAB — OCCULT BLOOD X 1 CARD TO LAB, STOOL: Fecal Occult Bld: NEGATIVE

## 2022-11-12 LAB — LIPASE, BLOOD: Lipase: 26 U/L (ref 11–51)

## 2022-11-12 LAB — URINALYSIS, MICROSCOPIC (REFLEX)

## 2022-11-12 MED ORDER — SODIUM CHLORIDE 0.9 % IV BOLUS
1000.0000 mL | Freq: Once | INTRAVENOUS | Status: AC
  Administered 2022-11-12: 1000 mL via INTRAVENOUS

## 2022-11-12 MED ORDER — DOCUSATE SODIUM 100 MG PO CAPS: 100.0000 mg | ORAL_CAPSULE | Freq: Two times a day (BID) | ORAL | 0 refills | Status: DC

## 2022-11-12 MED ORDER — IOHEXOL 300 MG/ML  SOLN
100.0000 mL | Freq: Once | INTRAMUSCULAR | Status: AC | PRN
  Administered 2022-11-12: 100 mL via INTRAVENOUS

## 2022-11-12 MED ORDER — KETOROLAC TROMETHAMINE 15 MG/ML IJ SOLN
15.0000 mg | Freq: Once | INTRAMUSCULAR | Status: AC
  Administered 2022-11-12: 15 mg via INTRAVENOUS
  Filled 2022-11-12: qty 1

## 2022-11-12 MED ORDER — ONDANSETRON HCL 4 MG/2ML IJ SOLN
4.0000 mg | Freq: Once | INTRAMUSCULAR | Status: AC
  Administered 2022-11-12: 4 mg via INTRAVENOUS
  Filled 2022-11-12: qty 2

## 2022-11-12 NOTE — ED Triage Notes (Addendum)
Pelvic pain and abd pain x 10 days back pain  also  had a CT scan  last week and has a  kidney stone and diverticulosis, but it is small ,some nausea ,has has an enema and miralax

## 2022-11-12 NOTE — ED Notes (Signed)
Patient transported to CT 

## 2022-11-12 NOTE — ED Provider Notes (Signed)
[facility] Provider Note   CSN: [id number] Arrival date & time: 11/12/22  1413     History {Add pertinent medical, surgical, social history, OB history to HPI:1} Chief Complaint  Patient presents with   Pelvic Pain    Shari Booth is a 52 y.o. 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  She had a CT scan last week which showed intrarenal stone. Patient additionally endorses constipation, reports no improvement after MiraLAX since yesterday, and enema this morning.  She reports she has some urinary frequency, dysuria associated with the left flank pain.   Pelvic Pain       Home Medications Prior to Admission medications   Medication Sig Start Date End Date Taking? Authorizing Provider  docusate sodium (COLACE) 100 MG capsule Take 1 capsule (100 mg total) by mouth every 12 (twelve) hours. 11/12/22  Yes Rei Medlen H, PA-C  ARIPiprazole (ABILIFY) 5 MG tablet Take 5 mg by mouth daily. 03/02/18   [provider]  botulinum toxin Type A (BOTOX) 200 units injection Inject 155 Units into the muscle every 3 (three) months. 07/30/22   Melvenia Beam, MD  clonazePAM (KLONOPIN) 0.5 MG tablet Take 0.25 mg by mouth daily as needed for anxiety.     [provider]  cyclobenzaprine (FLEXERIL) 10 MG tablet Take 1 tablet (10 mg total) by mouth at bedtime. 12/07/19   Melvenia Beam, MD  desvenlafaxine (PRISTIQ) 100 MG 24 hr tablet Take 100 mg by mouth daily.    [provider]  Multiple Vitamin (MULTIVITAMIN WITH MINERALS) TABS tablet Take 1 tablet by mouth daily.    [provider]  Rimegepant Sulfate (NURTEC) 75 MG TBDP Take 75 mg by mouth daily as needed. For migraines. Take as close to onset of migraine as possible. One  daily maximum. 10/10/20   Melvenia Beam, MD  Semaglutide-Weight Management (WEGOVY) 2.4 MG/0.75ML SOAJ Inject 2.4 mg into the skin every 7 (seven) days. 09/02/22   Melvenia Beam, MD      Allergies    Topamax [topiramate] and Zonegran [zonisamide]    Review of Systems   Review of Systems  Genitourinary:  Positive for pelvic pain.  All other systems reviewed and are negative.   Physical Exam Updated Vital Signs BP 99/61   Pulse 71   Temp 97.8 F (36.6 C)   Resp 18   Ht 5\' 4"  (1.626 m)   Wt 79.4 kg   SpO2 96%   BMI 30.04 kg/m  Physical Exam Vitals and nursing note reviewed.  Constitutional:      General: She is not in acute distress.    Appearance: Normal appearance.  HENT:     Head: Normocephalic and atraumatic.  Eyes:     General:        Right eye: No discharge.        Left eye: No discharge.  Cardiovascular:     Rate and Rhythm: Normal rate and regular rhythm.     Heart sounds: No murmur heard.    No friction rub. No gallop.  Pulmonary:     Effort: Pulmonary effort is normal.     Breath sounds: Normal breath sounds.  Abdominal:     General: Bowel sounds are normal.     Palpations: Abdomen is soft.  Comments: Patient with focal tenderness in LLQ without rebound, rigidity, guarding  Genitourinary:    Comments: Normal external appearance of rectum, no stool in rectal vault Skin:    General: Skin is warm and dry.     Capillary Refill: Capillary refill takes less than 2 seconds.  Neurological:     Mental Status: She is alert and oriented to person, place, and time.  Psychiatric:        Mood and Affect: Mood normal.        Behavior: Behavior normal.     ED Results / Procedures / Treatments   Labs (all labs ordered are listed, but only abnormal results are displayed) Labs Reviewed  COMPREHENSIVE METABOLIC PANEL - Abnormal; Notable for the following components:      Result Value   Sodium 132 (*)    Calcium 8.1 (*)    All other components within normal  limits  URINALYSIS, ROUTINE W REFLEX MICROSCOPIC - Abnormal; Notable for the following components:   Hgb urine dipstick TRACE (*)    All other components within normal limits  URINALYSIS, MICROSCOPIC (REFLEX) - Abnormal; Notable for the following components:   Bacteria, UA RARE (*)    All other components within normal limits  LIPASE, BLOOD  CBC  PREGNANCY, URINE  OCCULT BLOOD X 1 CARD TO LAB, STOOL    EKG None  Radiology CT ABDOMEN PELVIS W CONTRAST  Result Date: 11/12/2022 CLINICAL DATA:  Abdominal pain, acute, nonlocalized. Pelvic pain. Kidney stone. EXAM: CT ABDOMEN AND PELVIS WITH CONTRAST TECHNIQUE: Multidetector CT imaging of the abdomen and pelvis was performed using the standard protocol following bolus administration of intravenous contrast. RADIATION DOSE REDUCTION: This exam was performed according to the departmental dose-optimization program which includes automated exposure control, adjustment of the mA and/or kV according to patient size and/or use of iterative reconstruction technique. CONTRAST:  151mL OMNIPAQUE IOHEXOL 300 MG/ML  SOLN COMPARISON:  11/06/2022 FINDINGS: Lower chest: Mild scarring at the left lung base. No active process. Hepatobiliary: Multiple small liver cysts as seen previously. No change. No calcified gallstones. No ductal dilatation. Pancreas: Normal Spleen: Normal Adrenals/Urinary Tract: Adrenal glands are normal. Kidneys are normal. I do not think there is a stone in the left kidney. Think the previous finding related to some early contrast excretion. No hydroureteronephrosis or passing stone. No stone in the bladder. Stomach/Bowel: The stomach and small intestine are normal. There is a large amount of fecal matter in the colon that could possibly go along with constipation. Normal appearance of the appendix. Vascular/Lymphatic: Aorta and IVC are normal.  No adenopathy. Reproductive: Previous hysterectomy.  No pelvic mass. Other: No free fluid or air.  Musculoskeletal: Previous left sacroiliac fusion procedure. No acute bone or joint finding. IMPRESSION: 1. No evidence of urinary tract stone disease. No hydroureteronephrosis. I think what was described as a stone on the previous examination may have been early excretion of contrast. 2. Large amount of fecal matter in the colon that could possibly go along with constipation. Electronically Signed   By: Nelson Chimes M.D.   On: 11/12/2022 16:48    Procedures Procedures  {Document cardiac monitor, telemetry assessment procedure when appropriate:1}  Medications Ordered in ED Medications  ketorolac (TORADOL) 15 MG/ML injection 15 mg (15 mg Intravenous Given 11/12/22 1531)  ondansetron (ZOFRAN) injection 4 mg (4 mg Intravenous Given 11/12/22 1531)  iohexol (OMNIPAQUE) 300 MG/ML solution 100 mL (100 mLs Intravenous Contrast Given 11/12/22 1624)  sodium chloride 0.9 % bolus 1,000 mL (1,000  mLs Intravenous New Bag/Given 11/12/22 1702)    ED Course/ Medical Decision Making/ A&P Clinical Course as of 11/12/22 1714  Tue Nov 12, 2022  1513 Is an overall well-appearing 53 year old female who presents with concern for pelvic/urinary pain, abdominal pain for the last 10 days, she endorses some left-sided back pain, she also reports she had a CT scan last week that showed that she had a stone in the kidney but not in the ureter, some diverticulosis without diverticulitis.  She reports some occasional nausea, she has tried enema, MiraLAX for possible constipation reports 3 days since last bowel movement.  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  She reports that her pain is on the left.  She reports some bright red blood with attempted enema this morning. [CP]    Clinical Course User Index [CP] Jacinta Penalver H, PA-C   {   Click here for ABCD2, HEART and other calculatorsREFRESH Note before signing :1}                          Medical Decision  Making Amount and/or Complexity of Data Reviewed Labs: ordered. Radiology: ordered.  Risk OTC drugs. Prescription drug management.   This patient is a 52 y.o. female  who presents to the ED for concern of ***.   Differential diagnoses prior to evaluation: The emergent differential diagnosis includes, but is not limited to,  *** . This is not an exhaustive differential.   Past Medical History / Co-morbidities: ***  Additional history: Chart reviewed. Pertinent results include: ***  Physical Exam: Physical exam performed. The pertinent findings include: ***  Lab Tests/Imaging studies: I personally interpreted labs/imaging and the pertinent results include:  ***. ***I agree with the radiologist interpretation.  Cardiac monitoring: EKG obtained and interpreted by my attending physician which shows: ***   Medications: I ordered medication including ***.  I have reviewed the patients home medicines and have made adjustments as needed.   Disposition: After consideration of the diagnostic results and the patients response to treatment, I feel that *** .   ***emergency department workup does not suggest an emergent condition requiring admission or immediate intervention beyond what has been performed at this time. The plan is: ***. The patient is safe for discharge and has been instructed to return immediately for worsening symptoms, change in symptoms or any other concerns.  Final Clinical Impression(s) / ED Diagnoses Final diagnoses:  Constipation, unspecified constipation type  Urinary frequency  Hyponatremia    Rx / DC Orders ED Discharge Orders          Ordered    docusate sodium (COLACE) 100 MG capsule  Every 12 hours        11/12/22 1713

## 2022-11-12 NOTE — Discharge Instructions (Addendum)
You can mix half a bottle of 238 g MiraLAX with 50 to 64 ounces of Gatorade, Powerade, Pedialyte, take the colace tablets then I prescribed alternatively or additionally once to twice daily.  I would try to discontinue your tramadol if possible as this is working against you as far as creating additional constipation.  If you have additional dysuria despite having a satisfying bowel movement, recommend urology to address your ongoing symptoms as you have no evidence of UTI or kidney stone at this time.  Recommend following up with your primary care doctor sometime in the next couple weeks to ensure that your low sodium levels have improved.

## 2022-12-05 ENCOUNTER — Encounter: Payer: 59 | Admitting: Urology

## 2022-12-16 ENCOUNTER — Other Ambulatory Visit: Payer: Self-pay | Admitting: Family Medicine

## 2022-12-16 DIAGNOSIS — R1031 Right lower quadrant pain: Secondary | ICD-10-CM

## 2022-12-16 NOTE — Telephone Encounter (Signed)
Delivery pending pt consent. 

## 2022-12-18 ENCOUNTER — Other Ambulatory Visit: Payer: Self-pay | Admitting: Family Medicine

## 2022-12-18 DIAGNOSIS — N838 Other noninflammatory disorders of ovary, fallopian tube and broad ligament: Secondary | ICD-10-CM

## 2022-12-20 ENCOUNTER — Ambulatory Visit (INDEPENDENT_AMBULATORY_CARE_PROVIDER_SITE_OTHER): Payer: 59

## 2022-12-20 DIAGNOSIS — N838 Other noninflammatory disorders of ovary, fallopian tube and broad ligament: Secondary | ICD-10-CM

## 2022-12-24 ENCOUNTER — Ambulatory Visit (INDEPENDENT_AMBULATORY_CARE_PROVIDER_SITE_OTHER): Payer: 59 | Admitting: Neurology

## 2022-12-24 DIAGNOSIS — G43711 Chronic migraine without aura, intractable, with status migrainosus: Secondary | ICD-10-CM

## 2022-12-24 MED ORDER — ONABOTULINUMTOXINA 200 UNITS IJ SOLR
155.0000 [IU] | Freq: Once | INTRAMUSCULAR | Status: AC
  Administered 2022-12-24: 155 [IU] via INTRAMUSCULAR

## 2022-12-24 NOTE — Progress Notes (Signed)
Botox- 200 units x 1 vial Lot: [id number] Expiration: 02/2025 NDC: 1191-4782-95  Bacteriostatic 0.9% Sodium Chloride- 4 mL  Lot: [id number] Expiration: 06/2024  NDC: 57846-962-95  Dx: G43.711 S/P  Witnessed by Elana Alm

## 2022-12-24 NOTE — Progress Notes (Signed)
Consent Form Botulism Toxin Injection For Chronic Migraine   12/24/2022: still great results, stable, doing well  03/25/2022: doing great, >> 70% improvement in migraine freq and severity 10/30/2021: She has stopped taking the Ajovy and still doing well. We can push out botox to 16-20 weeks and see how she does.  try nurtec.    07/04/2021: Doing exceptionally well! Has 4 kids. First daughter off to [place]. Twins are 17. she has been able to go 20 weeks without a migraine. Tremendous improvement. She has done tremendously well with NO MIGRAINES since last botox injection, she almost doesn't remember what it feels like to have headaches. Baseline headache frequency is daily headaches with 15 migrainous.  100% reduction in frequency and severity of headaches and migraines.   Consent Form Botulism Toxin Injection For Chronic Migraine    Reviewed orally with patient, additionally signature is on file:  Botulism toxin has been approved by the Federal drug administration for treatment of chronic migraine. Botulism toxin does not cure chronic migraine and it may not be effective in some patients.  The administration of botulism toxin is accomplished by injecting a small amount of toxin into the muscles of the neck and head. Dosage must be titrated for each individual. Any benefits resulting from botulism toxin tend to wear off after 3 months with a repeat injection required if benefit is to be maintained. Injections are usually done every 3-4 months with maximum effect peak achieved by about 2 or 3 weeks. Botulism toxin is expensive and you should be sure of what costs you will incur resulting from the injection.  The side effects of botulism toxin use for chronic migraine may include:   -Transient, and usually mild, facial weakness with facial injections  -Transient, and usually mild, head or neck weakness with head/neck injections  -Reduction or loss of forehead facial animation due to  forehead muscle weakness  -Eyelid drooping  -Dry eye  -Pain at the site of injection or bruising at the site of injection  -Double vision  -Potential unknown long term risks  Contraindications: You should not have Botox if you are pregnant, nursing, allergic to albumin, have an infection, skin condition, or muscle weakness at the site of the injection, or have myasthenia gravis, Lambert-Eaton syndrome, or ALS.  It is also possible that as with any injection, there may be an allergic reaction or no effect from the medication. Reduced effectiveness after repeated injections is sometimes seen and rarely infection at the injection site may occur. All care will be taken to prevent these side effects. If therapy is given over a long time, atrophy and wasting in the muscle injected may occur. Occasionally the patient's become refractory to treatment because they develop antibodies to the toxin. In this event, therapy needs to be modified.  I have read the above information and consent to the administration of botulism toxin.    BOTOX PROCEDURE NOTE FOR MIGRAINE HEADACHE    Contraindications and precautions discussed with patient(above). Aseptic procedure was observed and patient tolerated procedure. Procedure performed by Dr. Artemio Aly  The condition has existed for more than 6 months, and pt does not have a diagnosis of ALS, Myasthenia Gravis or Lambert-Eaton Syndrome.  Risks and benefits of injections discussed and pt agrees to proceed with the procedure.  Written consent obtained  These injections are medically necessary. Pt  receives good benefits from these injections. These injections do not cause sedations or hallucinations which the oral therapies may cause.  Description of procedure:  The patient was placed in a sitting position. The standard protocol was used for Botox as follows, with 5 units of Botox injected at each site:   -Procerus muscle, midline injection  -Corrugator  muscle, bilateral injection  -Frontalis muscle, bilateral injection, with 2 sites each side, medial injection was performed in the upper one third of the frontalis muscle, in the region vertical from the medial inferior edge of the superior orbital rim. The lateral injection was again in the upper one third of the forehead vertically above the lateral limbus of the cornea, 1.5 cm lateral to the medial injection site.  -Temporalis muscle injection, 4 sites, bilaterally. The first injection was 3 cm above the tragus of the ear, second injection site was 1.5 cm to 3 cm up from the first injection site in line with the tragus of the ear. The third injection site was 1.5-3 cm forward between the first 2 injection sites. The fourth injection site was 1.5 cm posterior to the second injection site.   -Occipitalis muscle injection, 3 sites, bilaterally. The first injection was done one half way between the occipital protuberance and the tip of the mastoid process behind the ear. The second injection site was done lateral and superior to the first, 1 fingerbreadth from the first injection. The third injection site was 1 fingerbreadth superiorly and medially from the first injection site.  -Cervical paraspinal muscle injection, 2 sites, bilateral knee first injection site was 1 cm from the midline of the cervical spine, 3 cm inferior to the lower border of the occipital protuberance. The second injection site was 1.5 cm superiorly and laterally to the first injection site.  -Trapezius muscle injection was performed at 3 sites, bilaterally. The first injection site was in the upper trapezius muscle halfway between the inflection point of the neck, and the acromion. The second injection site was one half way between the acromion and the first injection site. The third injection was done between the first injection site and the inflection point of the neck.   Will return for repeat injection in 3 months.   155  units of Botox was used, 45u Botox not injected was wasted. The patient tolerated the procedure well, there were no complications of the above procedure.

## 2022-12-25 ENCOUNTER — Other Ambulatory Visit: Payer: 59

## 2023-01-03 ENCOUNTER — Other Ambulatory Visit: Payer: Self-pay | Admitting: Family Medicine

## 2023-01-03 DIAGNOSIS — N839 Noninflammatory disorder of ovary, fallopian tube and broad ligament, unspecified: Secondary | ICD-10-CM

## 2023-01-09 ENCOUNTER — Telehealth: Payer: Self-pay | Admitting: Pharmacy Technician

## 2023-01-09 NOTE — Telephone Encounter (Signed)
Patient Advocate Encounter  Prior Authorization for Nurtec 75MG  dispersible tablets  has been approved.    PA# [id number] Insurance [employer] Electronic Prior Authorization Form  Effective dates: 01/09/2023 through 01/09/2024

## 2023-01-09 NOTE — Telephone Encounter (Signed)
Patient Advocate Encounter   Received notification that prior authorization for Nurtec 75MG  dispersible tablets is required.   PA submitted on 01/09/2023 Key [id number] Insurance [employer] Electronic Prior Authorization Form Status is pending

## 2023-01-13 LAB — TSH: TSH: 2.33

## 2023-01-18 ENCOUNTER — Ambulatory Visit
Admission: RE | Admit: 2023-01-18 | Discharge: 2023-01-18 | Disposition: A | Discharge status: Home / Self Care | Payer: 59 | Source: Ambulatory Visit | Attending: Family Medicine | Admitting: Family Medicine

## 2023-01-18 DIAGNOSIS — N839 Noninflammatory disorder of ovary, fallopian tube and broad ligament, unspecified: Secondary | ICD-10-CM

## 2023-01-18 MED ORDER — GADOPICLENOL 0.5 MMOL/ML IV SOLN
7.5000 mL | Freq: Once | INTRAVENOUS | Status: AC | PRN
  Administered 2023-01-18: 7.5 mL via INTRAVENOUS

## 2023-01-21 ENCOUNTER — Other Ambulatory Visit: Payer: Self-pay | Admitting: Family Medicine

## 2023-01-21 DIAGNOSIS — Z1231 Encounter for screening mammogram for malignant neoplasm of breast: Secondary | ICD-10-CM

## 2023-03-12 ENCOUNTER — Ambulatory Visit
Admission: RE | Admit: 2023-03-12 | Discharge: 2023-03-12 | Disposition: A | Discharge status: Home / Self Care | Payer: 59 | Source: Ambulatory Visit

## 2023-03-12 DIAGNOSIS — Z1231 Encounter for screening mammogram for malignant neoplasm of breast: Secondary | ICD-10-CM

## 2023-03-18 ENCOUNTER — Ambulatory Visit: Payer: 59 | Admitting: Neurology

## 2023-03-24 ENCOUNTER — Telehealth: Payer: Self-pay | Admitting: Neurology

## 2023-03-24 NOTE — Telephone Encounter (Signed)
[facility] Gre[facility] Castilla) scheduling delivery of Botox on 03/26/23.  Wanted to let Jillian know Botox is now ready for delivery

## 2023-03-27 NOTE — Telephone Encounter (Signed)
[facility]) calling check if Botox package has been delivery on 03/26/23. Contact info: [phone]

## 2023-03-31 NOTE — Telephone Encounter (Signed)
Looks like we received Botox on 7/31 as stated in pt's appointment note.

## 2023-04-03 ENCOUNTER — Ambulatory Visit (INDEPENDENT_AMBULATORY_CARE_PROVIDER_SITE_OTHER): Payer: 59 | Admitting: Neurology

## 2023-04-03 DIAGNOSIS — G43711 Chronic migraine without aura, intractable, with status migrainosus: Secondary | ICD-10-CM

## 2023-04-03 MED ORDER — ONABOTULINUMTOXINA 200 UNITS IJ SOLR
155.0000 [IU] | Freq: Once | INTRAMUSCULAR | Status: AC
  Administered 2023-04-03: 155 [IU] via INTRAMUSCULAR

## 2023-04-03 NOTE — Progress Notes (Signed)
Consent Form Botulism Toxin Injection For Chronic Migraine 04/03/2923: stable  12/24/2022: still great results, stable, doing well  03/25/2022: doing great, >> 70% improvement in migraine freq and severity 10/30/2021: She has stopped taking the Ajovy and still doing well. We can push out botox to 16-20 weeks and see how she does.  try nurtec.    07/04/2021: Doing exceptionally well! Has 4 kids. First daughter off to [place]. Twins are 7. she has been able to go 20 weeks without a migraine. Tremendous improvement. She has done tremendously well with NO MIGRAINES since last botox injection, she almost doesn't remember what it feels like to have headaches. Baseline headache frequency is daily headaches with 15 migrainous.  100% reduction in frequency and severity of headaches and migraines.   Consent Form Botulism Toxin Injection For Chronic Migraine    Reviewed orally with patient, additionally signature is on file:  Botulism toxin has been approved by the Federal drug administration for treatment of chronic migraine. Botulism toxin does not cure chronic migraine and it may not be effective in some patients.  The administration of botulism toxin is accomplished by injecting a small amount of toxin into the muscles of the neck and head. Dosage must be titrated for each individual. Any benefits resulting from botulism toxin tend to wear off after 3 months with a repeat injection required if benefit is to be maintained. Injections are usually done every 3-4 months with maximum effect peak achieved by about 2 or 3 weeks. Botulism toxin is expensive and you should be sure of what costs you will incur resulting from the injection.  The side effects of botulism toxin use for chronic migraine may include:   -Transient, and usually mild, facial weakness with facial injections  -Transient, and usually mild, head or neck weakness with head/neck injections  -Reduction or loss of forehead facial  animation due to forehead muscle weakness  -Eyelid drooping  -Dry eye  -Pain at the site of injection or bruising at the site of injection  -Double vision  -Potential unknown long term risks  Contraindications: You should not have Botox if you are pregnant, nursing, allergic to albumin, have an infection, skin condition, or muscle weakness at the site of the injection, or have myasthenia gravis, Lambert-Eaton syndrome, or ALS.  It is also possible that as with any injection, there may be an allergic reaction or no effect from the medication. Reduced effectiveness after repeated injections is sometimes seen and rarely infection at the injection site may occur. All care will be taken to prevent these side effects. If therapy is given over a long time, atrophy and wasting in the muscle injected may occur. Occasionally the patient's become refractory to treatment because they develop antibodies to the toxin. In this event, therapy needs to be modified.  I have read the above information and consent to the administration of botulism toxin.    BOTOX PROCEDURE NOTE FOR MIGRAINE HEADACHE    Contraindications and precautions discussed with patient(above). Aseptic procedure was observed and patient tolerated procedure. Procedure performed by Dr. Artemio Aly  The condition has existed for more than 6 months, and pt does not have a diagnosis of ALS, Myasthenia Gravis or Lambert-Eaton Syndrome.  Risks and benefits of injections discussed and pt agrees to proceed with the procedure.  Written consent obtained  These injections are medically necessary. Pt  receives good benefits from these injections. These injections do not cause sedations or hallucinations which the oral therapies may cause.  Description of procedure:  The patient was placed in a sitting position. The standard protocol was used for Botox as follows, with 5 units of Botox injected at each site:   -Procerus muscle, midline  injection  -Corrugator muscle, bilateral injection  -Frontalis muscle, bilateral injection, with 2 sites each side, medial injection was performed in the upper one third of the frontalis muscle, in the region vertical from the medial inferior edge of the superior orbital rim. The lateral injection was again in the upper one third of the forehead vertically above the lateral limbus of the cornea, 1.5 cm lateral to the medial injection site.  -Temporalis muscle injection, 4 sites, bilaterally. The first injection was 3 cm above the tragus of the ear, second injection site was 1.5 cm to 3 cm up from the first injection site in line with the tragus of the ear. The third injection site was 1.5-3 cm forward between the first 2 injection sites. The fourth injection site was 1.5 cm posterior to the second injection site.   -Occipitalis muscle injection, 3 sites, bilaterally. The first injection was done one half way between the occipital protuberance and the tip of the mastoid process behind the ear. The second injection site was done lateral and superior to the first, 1 fingerbreadth from the first injection. The third injection site was 1 fingerbreadth superiorly and medially from the first injection site.  -Cervical paraspinal muscle injection, 2 sites, bilateral knee first injection site was 1 cm from the midline of the cervical spine, 3 cm inferior to the lower border of the occipital protuberance. The second injection site was 1.5 cm superiorly and laterally to the first injection site.  -Trapezius muscle injection was performed at 3 sites, bilaterally. The first injection site was in the upper trapezius muscle halfway between the inflection point of the neck, and the acromion. The second injection site was one half way between the acromion and the first injection site. The third injection was done between the first injection site and the inflection point of the neck.   Will return for repeat injection  in 3 months.   155 units of Botox was used, 45u Botox not injected was wasted. The patient tolerated the procedure well, there were no complications of the above procedure.

## 2023-04-03 NOTE — Progress Notes (Signed)
Botox- 200 units x 1 vial Lot: [id number] Expiration: 07/2025 NDC: 2440-1027-25  Bacteriostatic 0.9% Sodium Chloride- 4mL total [id number] Expiration: 11/25/23 NDC: 4259-5638-75  Dx: G43.711 SP  Witnessed by: Adolm Joseph

## 2023-04-05 ENCOUNTER — Ambulatory Visit
Admission: RE | Admit: 2023-04-05 | Discharge: 2023-04-05 | Disposition: A | Discharge status: Home / Self Care | Payer: 59 | Source: Ambulatory Visit

## 2023-04-05 ENCOUNTER — Other Ambulatory Visit: Payer: Self-pay

## 2023-04-05 VITALS — BP 105/72 | HR 72 | Temp 98.0°F | Resp 14 | Ht 64.0 in | Wt 170.0 lb

## 2023-04-05 DIAGNOSIS — H6011 Cellulitis of right external ear: Secondary | ICD-10-CM

## 2023-04-05 DIAGNOSIS — H938X1 Other specified disorders of right ear: Secondary | ICD-10-CM | POA: Diagnosis not present

## 2023-04-05 MED ORDER — DOXYCYCLINE HYCLATE 100 MG PO CAPS: 100.0000 mg | ORAL_CAPSULE | Freq: Two times a day (BID) | ORAL | 0 refills | Status: DC

## 2023-04-05 MED ORDER — PREDNISONE 10 MG (21) PO TBPK: ORAL_TABLET | Freq: Every day | ORAL | 0 refills | Status: DC

## 2023-04-05 NOTE — Discharge Instructions (Addendum)
Advised patient to take medications as directed with food to completion.  Advised patient to take Prednisone with first dose of Doxycycline for the next 10 days.  Encouraged to increase daily water intake to 64 ounces per day while taking these medications.  Advised if symptoms worsen and/or unresolved please follow-up with PCP or here for further evaluation.

## 2023-04-05 NOTE — ED Triage Notes (Signed)
Pt started with pain and swelling to right ear cartilage one week ago that has spread.  Had cartilage piercing placed 3 months ago, pt removed this 2 days ago thinking from that.  No fevers or drainage.  Ear red and swollen

## 2023-04-05 NOTE — ED Provider Notes (Signed)
[facility]    CSN: [id number] Arrival date & time: 04/05/23  0849      History   Chief Complaint Chief Complaint  Patient presents with   Facial Swelling    HPI Shari Booth is a 52 y.o. female.   HPI Pleasant 52 year old female presents with pain and swelling to right ear cartilage 1 week ago that has spread.  Patient reports having cartilage piercing placed 3 months ago.  Reports removing this piercing 2 days ago.  PMH significant for depression, fibromyalgia, and IBS.  Past Medical History:  Diagnosis Date   Anxiety    Asthma    Hx with pregnancy only - No inhaler- no current problems   Bell's palsy    at age 12 & age 13, no current problems   Common migraine with intractable migraine 07/29/2016   Depression    Fibromyalgia    IBS (irritable bowel syndrome)    diet controlled   Neuromuscular disorder (HCC)    trigeminal nuralga - not currently active    Patient Active Problem List   Diagnosis Date Noted   Back pain 07/10/2018   Neck pain 07/10/2018   Symptomatic mammary hypertrophy 07/10/2018   Chronic migraine without aura, intractable, with status migrainosus 07/29/2016   S/P laparoscopic hysterectomy 06/12/2015    Past Surgical History:  Procedure Laterality Date   ABDOMINAL HYSTERECTOMY  2016   ABLATION     BREAST REDUCTION SURGERY Bilateral 08/12/2018   Procedure: BILATERAL MAMMARY REDUCTION  (BREAST);  Surgeon: Peggye Form, DO;  Location: [facility];  Service: Plastics;  Laterality: Bilateral;   ceserean section x 3     COLONOSCOPY     CYSTOSCOPY N/A 06/12/2015   Procedure: CYSTOSCOPY;  Surgeon: Noland Fordyce, MD;  Location: [facility];  Service: Gynecology;  Laterality: N/A;   DILATION AND CURETTAGE OF UTERUS     LAPAROSCOPIC LYSIS OF ADHESIONS  01/12/2018   Procedure: LAPAROSCOPIC LYSIS OF ADHESIONS;  Surgeon: Noland Fordyce, MD;  Location: [facility];  Service: Gynecology;;   LAPAROSCOPY N/A 01/12/2018    Procedure: Diagnostic Laparoscopy;  Surgeon: Noland Fordyce, MD;  Location: [facility];  Service: Gynecology;  Laterality: N/A;   REDUCTION MAMMAPLASTY Bilateral    ROBOTIC ASSISTED LAPAROSCOPIC LYSIS OF ADHESION  06/12/2015   Procedure: ROBOTIC ASSISTED LAPAROSCOPIC LYSIS OF ADHESION;  Surgeon: Noland Fordyce, MD;  Location: [facility];  Service: Gynecology;;   WISDOM TOOTH EXTRACTION      OB History   No obstetric history on file.      Home Medications    Prior to Admission medications   Medication Sig Start Date End Date Taking? Authorizing Provider  doxycycline (VIBRAMYCIN) 100 MG capsule Take 1 capsule (100 mg total) by mouth 2 (two) times daily for 10 days. 04/05/23 04/15/23 Yes Trevor Iha, FNP  predniSONE (STERAPRED UNI-PAK 21 TAB) 10 MG (21) TBPK tablet Take by mouth daily. Take 6 tabs by mouth daily  for 2 days, then 5 tabs for 2 days, then 4 tabs for 2 days, then 3 tabs for 2 days, 2 tabs for 2 days, then 1 tab by mouth daily for 2 days 04/05/23  Yes Trevor Iha, FNP  botulinum toxin Type A (BOTOX) 200 units injection Inject 155 Units into the muscle every 3 (three) months. 07/30/22   Anson Fret, MD  clonazePAM (KLONOPIN) 0.5 MG tablet Take 0.25 mg by mouth daily as needed for anxiety.     [provider]  cyclobenzaprine (FLEXERIL) 10  MG tablet Take 1 tablet (10 mg total) by mouth at bedtime. 12/07/19   Anson Fret, MD  desvenlafaxine (PRISTIQ) 100 MG 24 hr tablet Take 100 mg by mouth daily.    [provider]  Multiple Vitamin (MULTIVITAMIN WITH MINERALS) TABS tablet Take 1 tablet by mouth daily.    [provider]  Rimegepant Sulfate (NURTEC) 75 MG TBDP Take 75 mg by mouth daily as needed. For migraines. Take as close to onset of migraine as possible. One daily maximum. 10/10/20   Anson Fret, MD    Family History Family History  Problem Relation Age of Onset   High blood pressure Mother    Migraines Mother    High Cholesterol Father     Aortic stenosis Father    Arrhythmia Father        Pacemaker   Breast cancer Maternal Grandmother    Breast cancer Maternal Aunt    Lung cancer Maternal Aunt    Mental retardation Daughter     Social History Social History   Tobacco Use   Smoking status: Former    Current packs/day: 0.00    Average packs/day: 0.3 packs/day for 20.0 years (5.0 ttl pk-yrs)    Types: Cigarettes    Start date: 08/26/1996    Quit date: 08/26/2016    Years since quitting: 6.6   Smokeless tobacco: Never  Vaping Use   Vaping status: Never Used  Substance Use Topics   Alcohol use: Yes    Comment: 1-2 glasses per week   Drug use: No     Allergies   Topamax [topiramate] and Zonegran [zonisamide]   Review of Systems Review of Systems  Skin:  Positive for rash.     Physical Exam Triage Vital Signs ED Triage Vitals [04/05/23 0859]  Encounter Vitals Group     BP 105/72     Systolic BP Percentile      Diastolic BP Percentile      Pulse Rate 72     Resp 14     Temp 98 F (36.7 C)     Temp Source Oral     SpO2 98 %     Weight 170 lb (77.1 kg)     Height 5\' 4"  (1.626 m)     Head Circumference      Peak Flow      Pain Score 5     Pain Loc      Pain Education      Exclude from Growth Chart    No data found.  Updated Vital Signs BP 105/72   Pulse 72   Temp 98 F (36.7 C) (Oral)   Resp 14   Ht 5\' 4"  (1.626 m)   Wt 170 lb (77.1 kg)   SpO2 98%   BMI 29.18 kg/m    Physical Exam Vitals and nursing note reviewed.  Constitutional:      Appearance: Normal appearance. She is obese.  HENT:     Head: Normocephalic and atraumatic.     Right Ear: Tympanic membrane and ear canal normal.     Left Ear: Tympanic membrane, ear canal and external ear normal.     Mouth/Throat:     Mouth: Mucous membranes are moist.     Pharynx: Oropharynx is clear.  Eyes:     Extraocular Movements: Extraocular movements intact.     Conjunctiva/sclera: Conjunctivae normal.     Pupils: Pupils are equal,  round, and reactive to light.  Cardiovascular:  Rate and Rhythm: Normal rate and regular rhythm.     Pulses: Normal pulses.     Heart sounds: Normal heart sounds.  Pulmonary:     Effort: Pulmonary effort is normal.     Breath sounds: Normal breath sounds. No wheezing, rhonchi or rales.  Musculoskeletal:     Cervical back: Normal range of motion and neck supple.  Skin:    General: Skin is warm and dry.     Comments: Right helix (posterior superior aspect) erythematous, with soft tissue swelling noted-please see image below  Neurological:     General: No focal deficit present.     Mental Status: She is alert and oriented to person, place, and time. Mental status is at baseline.  Psychiatric:        Mood and Affect: Mood normal.        Behavior: Behavior normal.        Thought Content: Thought content normal.      UC Treatments / Results  Labs (all labs ordered are listed, but only abnormal results are displayed) Labs Reviewed - No data to display  EKG   Radiology No results found.  Procedures Procedures (including critical care time)  Medications Ordered in UC Medications - No data to display  Initial Impression / Assessment and Plan / UC Course  I have reviewed the triage vital signs and the nursing notes.  Pertinent labs & imaging results that were available during my care of the patient were reviewed by me and considered in my medical decision making (see chart for details).     MDM: 1.  Cellulitis of helix, right-Rx'd doxycycline 100 mg capsule twice daily x 10 days; 2.  Swelling of right ear-Rx'd Sterapred Unipak (tapering from 60 mg to 10 mg over 10 days). Advised patient to take medications as directed with food to completion.  Advised patient to take Prednisone with first dose of Doxycycline for the next 10 days.  Encouraged to increase daily water intake to 64 ounces per day while taking these medications.  Advised if symptoms worsen and/or unresolved please  follow-up with PCP or here for further evaluation.  Final Clinical Impressions(s) / UC Diagnoses   Final diagnoses:  Cellulitis of helix, right  Swelling of right ear     Discharge Instructions      Advised patient to take medications as directed with food to completion.  Advised patient to take Prednisone with first dose of Doxycycline for the next 10 days.  Encouraged to increase daily water intake to 64 ounces per day while taking these medications.  Advised if symptoms worsen and/or unresolved please follow-up with PCP or here for further evaluation.     ED Prescriptions     Medication Sig Dispense Auth. Provider   doxycycline (VIBRAMYCIN) 100 MG capsule Take 1 capsule (100 mg total) by mouth 2 (two) times daily for 10 days. 20 capsule Trevor Iha, FNP   predniSONE (STERAPRED UNI-PAK 21 TAB) 10 MG (21) TBPK tablet Take by mouth daily. Take 6 tabs by mouth daily  for 2 days, then 5 tabs for 2 days, then 4 tabs for 2 days, then 3 tabs for 2 days, 2 tabs for 2 days, then 1 tab by mouth daily for 2 days 42 tablet Trevor Iha, FNP      PDMP not reviewed this encounter.   Trevor Iha, FNP 04/05/23 [phone]

## 2023-04-07 DIAGNOSIS — M26609 Unspecified temporomandibular joint disorder, unspecified side: Secondary | ICD-10-CM | POA: Insufficient documentation

## 2023-04-13 ENCOUNTER — Ambulatory Visit
Admission: RE | Admit: 2023-04-13 | Discharge: 2023-04-13 | Disposition: A | Discharge status: Home / Self Care | Payer: 59 | Source: Ambulatory Visit | Attending: Physician Assistant | Admitting: Physician Assistant

## 2023-04-13 VITALS — BP 117/77 | HR 67 | Temp 98.2°F | Resp 16 | Ht 64.0 in | Wt 170.0 lb

## 2023-04-13 DIAGNOSIS — H61001 Unspecified perichondritis of right external ear: Secondary | ICD-10-CM | POA: Diagnosis not present

## 2023-04-13 DIAGNOSIS — H61031 Chondritis of right external ear: Secondary | ICD-10-CM

## 2023-04-13 MED ORDER — HYDROCODONE-ACETAMINOPHEN 5-325 MG PO TABS: 0.5000 | ORAL_TABLET | Freq: Two times a day (BID) | ORAL | 0 refills | Status: AC | PRN

## 2023-04-13 MED ORDER — CIPROFLOXACIN HCL 500 MG PO TABS: 500.0000 mg | ORAL_TABLET | Freq: Two times a day (BID) | ORAL | 0 refills | Status: DC

## 2023-04-13 NOTE — ED Provider Notes (Signed)
[facility]    CSN: [id number] Arrival date & time: 04/13/23  1055      History   Chief Complaint Chief Complaint  Patient presents with   Ear Injury    Ear piercing still infected after over 1 week of antibiotics & steroids & very painful. - Entered by patient    HPI Shari Booth is a 52 y.o. female.   Patient presents today with a several week history of pain and swelling of her right pinna.  She was seen by clinic on 04/05/2023 at which point she was prescribed prednisone taper and doxycycline.  She has been taking this medication but continues to have worsening symptoms including increasing swelling and pain.  She does have a piercing in this area that was removed.  The piercing was done approximately 3 months ago she did not have any issues until a few weeks ago.  She denies any recent swimming or exposure to dirty water.  She denies history of recurrent skin infections or MRSA.  Denies history of diabetes or immunosuppression.  She has been taking tramadol as well as over-the-counter analgesics with minimal improvement of symptoms.  Reports pain is severe and rated 7 on a 0-10 pain scale, described as throbbing, worse with sleeping or palpation of the area, no relieving factors notified.  She has not seen ENT.    Past Medical History:  Diagnosis Date   Anxiety    Asthma    Hx with pregnancy only - No inhaler- no current problems   Bell's palsy    at age 53 & age 29, no current problems   Common migraine with intractable migraine 07/29/2016   Depression    Fibromyalgia    IBS (irritable bowel syndrome)    diet controlled   Neuromuscular disorder (HCC)    trigeminal nuralga - not currently active    Patient Active Problem List   Diagnosis Date Noted   Back pain 07/10/2018   Neck pain 07/10/2018   Symptomatic mammary hypertrophy 07/10/2018   Chronic migraine without aura, intractable, with status migrainosus 07/29/2016   S/P laparoscopic hysterectomy  06/12/2015    Past Surgical History:  Procedure Laterality Date   ABDOMINAL HYSTERECTOMY  2016   ABLATION     BREAST REDUCTION SURGERY Bilateral 08/12/2018   Procedure: BILATERAL MAMMARY REDUCTION  (BREAST);  Surgeon: Peggye Form, DO;  Location: [facility];  Service: Plastics;  Laterality: Bilateral;   ceserean section x 3     COLONOSCOPY     CYSTOSCOPY N/A 06/12/2015   Procedure: CYSTOSCOPY;  Surgeon: Noland Fordyce, MD;  Location: [facility];  Service: Gynecology;  Laterality: N/A;   DILATION AND CURETTAGE OF UTERUS     LAPAROSCOPIC LYSIS OF ADHESIONS  01/12/2018   Procedure: LAPAROSCOPIC LYSIS OF ADHESIONS;  Surgeon: Noland Fordyce, MD;  Location: [facility];  Service: Gynecology;;   LAPAROSCOPY N/A 01/12/2018   Procedure: Diagnostic Laparoscopy;  Surgeon: Noland Fordyce, MD;  Location: [facility];  Service: Gynecology;  Laterality: N/A;   REDUCTION MAMMAPLASTY Bilateral    ROBOTIC ASSISTED LAPAROSCOPIC LYSIS OF ADHESION  06/12/2015   Procedure: ROBOTIC ASSISTED LAPAROSCOPIC LYSIS OF ADHESION;  Surgeon: Noland Fordyce, MD;  Location: [facility];  Service: Gynecology;;   WISDOM TOOTH EXTRACTION      OB History   No obstetric history on file.      Home Medications    Prior to Admission medications   Medication Sig Start Date End Date Taking? Authorizing Provider  botulinum  toxin Type A (BOTOX) 200 units injection Inject 155 Units into the muscle every 3 (three) months. 07/30/22  Yes Anson Fret, MD  ciprofloxacin (CIPRO) 500 MG tablet Take 1 tablet (500 mg total) by mouth every 12 (twelve) hours. 04/13/23  Yes Elye Harmsen K, PA-C  clonazePAM (KLONOPIN) 0.5 MG tablet Take 0.25 mg by mouth daily as needed for anxiety.    Yes [provider]  desvenlafaxine (PRISTIQ) 100 MG 24 hr tablet Take 100 mg by mouth daily.   Yes [provider]  HYDROcodone-acetaminophen (NORCO/VICODIN) 5-325 MG tablet Take 0.5-1 tablets by mouth 2 (two) times daily as needed  for up to 2 days for severe pain. 04/13/23 04/15/23 Yes Dalena Plantz, Noberto Retort, PA-C  Multiple Vitamin (MULTIVITAMIN WITH MINERALS) TABS tablet Take 1 tablet by mouth daily.   Yes [provider]  Rimegepant Sulfate (NURTEC) 75 MG TBDP Take 75 mg by mouth daily as needed. For migraines. Take as close to onset of migraine as possible. One daily maximum. 10/10/20  Yes Anson Fret, MD    Family History Family History  Problem Relation Age of Onset   High blood pressure Mother    Migraines Mother    High Cholesterol Father    Aortic stenosis Father    Arrhythmia Father        Pacemaker   Breast cancer Maternal Grandmother    Breast cancer Maternal Aunt    Lung cancer Maternal Aunt    Mental retardation Daughter     Social History Social History   Tobacco Use   Smoking status: Former    Current packs/day: 0.00    Average packs/day: 0.3 packs/day for 20.0 years (5.0 ttl pk-yrs)    Types: Cigarettes    Start date: 08/26/1996    Quit date: 08/26/2016    Years since quitting: 6.6   Smokeless tobacco: Never  Vaping Use   Vaping status: Never Used  Substance Use Topics   Alcohol use: Yes    Comment: 1-2 glasses per week   Drug use: No     Allergies   Topamax [topiramate] and Zonegran [zonisamide]   Review of Systems Review of Systems  Constitutional:  Positive for activity change. Negative for appetite change, fatigue and fever.  HENT:  Positive for ear pain. Negative for ear discharge.   Respiratory:  Negative for cough and shortness of breath.   Cardiovascular:  Negative for chest pain.  Gastrointestinal:  Negative for abdominal pain, diarrhea, nausea and vomiting.     Physical Exam Triage Vital Signs ED Triage Vitals  Encounter Vitals Group     BP 04/13/23 1128 117/77     Systolic BP Percentile --      Diastolic BP Percentile --      Pulse Rate 04/13/23 1128 67     Resp 04/13/23 1128 16     Temp 04/13/23 1128 98.2 F (36.8 C)     Temp Source 04/13/23 1128 Oral      SpO2 04/13/23 1128 97 %     Weight 04/13/23 1130 170 lb (77.1 kg)     Height 04/13/23 1130 5\' 4"  (1.626 m)     Head Circumference --      Peak Flow --      Pain Score 04/13/23 1129 6     Pain Loc --      Pain Education --      Exclude from Growth Chart --    No data found.  Updated Vital Signs BP 117/77 (  BP Location: Left Arm)   Pulse 67   Temp 98.2 F (36.8 C) (Oral)   Resp 16   Ht 5\' 4"  (1.626 m)   Wt 170 lb (77.1 kg)   SpO2 97%   BMI 29.18 kg/m   Visual Acuity Right Eye Distance:   Left Eye Distance:   Bilateral Distance:    Right Eye Near:   Left Eye Near:    Bilateral Near:     Physical Exam Vitals reviewed.  Constitutional:      General: She is awake. She is not in acute distress.    Appearance: Normal appearance. She is well-developed. She is not ill-appearing.     Comments: Very pleasant female appear stated age in no acute distress sitting comfortably in exam room  HENT:     Head: Normocephalic and atraumatic.     Right Ear: Tympanic membrane and ear canal normal.     Left Ear: Tympanic membrane, ear canal and external ear normal. Tympanic membrane is not erythematous or bulging.     Ears:      Nose:     Right Sinus: Maxillary sinus tenderness present. No frontal sinus tenderness.     Left Sinus: Maxillary sinus tenderness and frontal sinus tenderness present.     Mouth/Throat:     Pharynx: Uvula midline. No oropharyngeal exudate or posterior oropharyngeal erythema.  Cardiovascular:     Rate and Rhythm: Normal rate and regular rhythm.     Heart sounds: Normal heart sounds, S1 normal and S2 normal. No murmur heard. Pulmonary:     Effort: Pulmonary effort is normal.     Breath sounds: Normal breath sounds. No wheezing, rhonchi or rales.     Comments: Clear to auscultation bilaterally Lymphadenopathy:     Head:     Right side of head: No submental, submandibular or tonsillar adenopathy.     Left side of head: No submental, submandibular or  tonsillar adenopathy.     Cervical: Cervical adenopathy present.     Right cervical: Superficial cervical adenopathy present.     Left cervical: No superficial cervical adenopathy.  Psychiatric:        Behavior: Behavior is cooperative.      UC Treatments / Results  Labs (all labs ordered are listed, but only abnormal results are displayed) Labs Reviewed - No data to display  EKG   Radiology No results found.  Procedures Procedures (including critical care time)  Medications Ordered in UC Medications - No data to display  Initial Impression / Assessment and Plan / UC Course  I have reviewed the triage vital signs and the nursing notes.  Pertinent labs & imaging results that were available during my care of the patient were reviewed by me and considered in my medical decision making (see chart for details).     Concern for perichondritis.  Will cover with Pseudomonas and patient was sent to ciprofloxacin 500 mg twice daily for 7 days.  She has failed over-the-counter analgesics for pain management so a few doses of hydrocodone were sent to pharmacy.  We discussed that this should not be combined with her previously prescribed benzos; she reports he is not taking this regularly and her last refill was several months ago according to PDMP.  No inappropriate refills noted.  We discussed this can be sedating and addictive so she should limit use is much as possible.  We discussed that given her recurrent persistent symptoms I recommend that she follow-up with ENT and was given  contact information for local provider with instruction to call to schedule an appointment.  We discussed that if anything worsens and she has increasing swelling, pain, redness, fever, nausea, vomiting she needs to be seen immediately.  Strict return precautions given.  Final Clinical Impressions(s) / UC Diagnoses   Final diagnoses:  Perichondritis and chondritis of pinna, right     Discharge Instructions       Stop doxycycline and start ciprofloxacin twice daily for 7 days.  This can upset your stomach so take it with food.  I have called in a few doses of hydrocodone.  Try to limit use is much as possible as this is both addictive and sedating.  Do not combine this with Klonopin as the combination is dangerous.  Follow-up with ENT.  Call them to schedule appointment.  If anything worsens please return for reevaluation.     ED Prescriptions     Medication Sig Dispense Auth. Provider   ciprofloxacin (CIPRO) 500 MG tablet Take 1 tablet (500 mg total) by mouth every 12 (twelve) hours. 14 tablet Navie Lamoreaux K, PA-C   HYDROcodone-acetaminophen (NORCO/VICODIN) 5-325 MG tablet Take 0.5-1 tablets by mouth 2 (two) times daily as needed for up to 2 days for severe pain. 4 tablet Miyani Cronic K, PA-C      I have reviewed the PDMP during this encounter.   Jeani Hawking, PA-C 04/13/23 1200

## 2023-04-13 NOTE — Discharge Instructions (Addendum)
Stop doxycycline and start ciprofloxacin twice daily for 7 days.  This can upset your stomach so take it with food.  I have called in a few doses of hydrocodone.  Try to limit use is much as possible as this is both addictive and sedating.  Do not combine this with Klonopin as the combination is dangerous.  Follow-up with ENT.  Call them to schedule appointment.  If anything worsens please return for reevaluation.

## 2023-04-13 NOTE — ED Triage Notes (Signed)
Patient states that she got her right ear pierced about 3 months ago, but the area has become infected.  Patient seen and given antibiotics and steroids.  No better.  Almost completed with medications.

## 2023-04-17 ENCOUNTER — Encounter (HOSPITAL_BASED_OUTPATIENT_CLINIC_OR_DEPARTMENT_OTHER): Payer: Self-pay | Admitting: Urology

## 2023-04-17 ENCOUNTER — Other Ambulatory Visit: Payer: Self-pay

## 2023-04-17 ENCOUNTER — Emergency Department (HOSPITAL_BASED_OUTPATIENT_CLINIC_OR_DEPARTMENT_OTHER)
Admission: EM | Admit: 2023-04-17 | Discharge: 2023-04-17 | Disposition: A | Discharge status: Home / Self Care | Payer: 59 | Attending: Emergency Medicine | Admitting: Emergency Medicine

## 2023-04-17 DIAGNOSIS — R519 Headache, unspecified: Secondary | ICD-10-CM | POA: Diagnosis present

## 2023-04-17 DIAGNOSIS — H61001 Unspecified perichondritis of right external ear: Secondary | ICD-10-CM | POA: Insufficient documentation

## 2023-04-17 MED ORDER — GABAPENTIN 300 MG PO CAPS: 300.0000 mg | ORAL_CAPSULE | Freq: Three times a day (TID) | ORAL | 0 refills | Status: DC

## 2023-04-17 MED ORDER — GABAPENTIN 300 MG PO CAPS
300.0000 mg | ORAL_CAPSULE | Freq: Once | ORAL | Status: AC
  Administered 2023-04-17: 300 mg via ORAL
  Filled 2023-04-17: qty 1

## 2023-04-17 MED ORDER — CIPROFLOXACIN HCL 500 MG PO TABS: 500.0000 mg | ORAL_TABLET | Freq: Two times a day (BID) | ORAL | 0 refills | Status: AC

## 2023-04-17 NOTE — ED Triage Notes (Signed)
Pt states infection in cartilage to right ear, has taken 2 rounds of antibiotic and steroids  Currently on Cipro, prior took doxycyline  States pain and redness now spreading

## 2023-04-17 NOTE — Discharge Instructions (Addendum)
Will increase your antibiotic course to 10 days in total, if redness and swelling in the ear is not improving after completing your antibiotics you will need to follow-up with ENT.  If you notice worsening swelling or redness streaking from your ear you should return to the emergency department.  I suspect a lot of the pain you are dealing with is due to a flareup of trigeminal neuralgia.  Start gabapentin 100 mg 3 times daily after 2 to 3 days increase to 203 times daily then after an additional 2 to 3 days increase to 300 daily.  I have written a prescription for 300 mg tablets 3 times a day in addition to the 100 mg tablets that your PCP wrote, to help reduce the amount of pills you are having to take once you get up to 300 mg doses.  Please call to schedule follow-up appointment with Dr. Jenne Pane with ENT.  I recommend you follow-up with your neurologist regarding the trigeminal neuralgia as well.

## 2023-04-17 NOTE — ED Provider Notes (Signed)
[place] [facility] Provider Note   CSN: [id number] Arrival date & time: 04/17/23  1222     History  Chief Complaint  Patient presents with   Ear Problem    Shari Booth is a 52 y.o. female.  Shari Booth is a 52 y.o. female with a history of migraines and trigeminal neuralgia, who presents to the emergency department for evaluation of continued ear pain and facial pain.  Patient had a cartilage piercing done 3 months ago, had not had any issues and then close the beginning of August she noticed some redness pain and swelling around the cartilage piercing after a few days of this not improving she remove the piercing despite that she continued to have redness and pain of the ear, was seen at urgent care on 8/10, prescribed doxycycline for possible cellulitis of the auricle as well as steroids.  Patient returned to urgent care on 8/18 because she did not feel this was improving and continued to have increasing swelling and pain around the auricle.  No recent swimming or exposure to dirty water.  During this visit she was diagnosed with perichondritis, antibiotic regimen changed to ciprofloxacin and patient prescribed analgesics and given referral to ENT.  Patient has not been able to see ENT yet, but reports in the past 4 days she has not seen much improvement although redness and swelling has not worsened or spread at all she has noticed increased facial pain and pain radiating from the ear into her face that feels similar to her prior episode of trigeminal neuralgia.  She reports when she was initially diagnosed with trigeminal neuralgia she was placed on gabapentin but has not had issues with this in years.  Wonders if the issue with her ear could have flared this.         Home Medications Prior to Admission medications   Medication Sig Start Date End Date Taking? Authorizing Provider  botulinum toxin Type A (BOTOX) 200 units injection  Inject 155 Units into the muscle every 3 (three) months. 07/30/22   Anson Fret, MD  ciprofloxacin (CIPRO) 500 MG tablet Take 1 tablet (500 mg total) by mouth every 12 (twelve) hours. 04/13/23   Raspet, Noberto Retort, PA-C  clonazePAM (KLONOPIN) 0.5 MG tablet Take 0.25 mg by mouth daily as needed for anxiety.     [provider]  desvenlafaxine (PRISTIQ) 100 MG 24 hr tablet Take 100 mg by mouth daily.    [provider]  Multiple Vitamin (MULTIVITAMIN WITH MINERALS) TABS tablet Take 1 tablet by mouth daily.    [provider]  Rimegepant Sulfate (NURTEC) 75 MG TBDP Take 75 mg by mouth daily as needed. For migraines. Take as close to onset of migraine as possible. One daily maximum. 10/10/20   Anson Fret, MD      Allergies    Topamax [topiramate] and Zonegran [zonisamide]    Review of Systems   Review of Systems  Constitutional:  Negative for chills and fever.  HENT:  Positive for ear pain.     Physical Exam Updated Vital Signs BP 116/78 (BP Location: Right Arm)   Pulse 81   Temp 97.8 F (36.6 C) (Oral)   Resp 18   Ht 5\' 4"  (1.626 m)   Wt 77 kg   SpO2 100%   BMI 29.14 kg/m  Physical Exam Vitals and nursing note reviewed.  Constitutional:      General: She is not in acute  distress.    Appearance: Normal appearance. She is well-developed. She is not ill-appearing or diaphoretic.  HENT:     Head: Normocephalic and atraumatic.     Comments: Right side of the face around the ear is hypersensitive to touch without erythema or lesion, no swelling noted.    Ears:     Comments: See photo below.  Redness contained to the upper auricle with small area of scabbing noted, no significant swelling or fluctuance noted, no swelling or redness behind the ear, no mastoid tenderness.  External auditory canal without edema or erythema, TM well-visualized, pearly gray with good cone of light.  No redness extending onto the face.  Mild preauricular lymphadenopathy  Eyes:      General:        Right eye: No discharge.        Left eye: No discharge.  Neck:     Comments: Mild lymphadenopathy noted in the anterior cervical chain on the right Cardiovascular:     Rate and Rhythm: Normal rate and regular rhythm.  Pulmonary:     Effort: Pulmonary effort is normal. No respiratory distress.  Neurological:     Mental Status: She is alert and oriented to person, place, and time.     Coordination: Coordination normal.  Psychiatric:        Mood and Affect: Mood normal.        Behavior: Behavior normal.         ED Results / Procedures / Treatments   Labs (all labs ordered are listed, but only abnormal results are displayed) Labs Reviewed - No data to display  EKG None  Radiology No results found.  Procedures Procedures    Medications Ordered in ED Medications  gabapentin (NEURONTIN) capsule 300 mg (has no administration in time range)    ED Course/ Medical Decision Making/ A&P                                 Medical Decision Making Risk Prescription drug management.   52 year old female presents with ongoing issues with perichondritis, took doxycycline without improvement, currently on ciprofloxacin for the past 4 days, has been having worsening pain radiating from the ear into the face that has felt similar to her prior trigeminal neuralgia.  Compared to photos from urgent care ear itself does not appear to be worsening.  No palpable fluctuance or signs of abscess, no mastoid tenderness or signs of malignant otitis externa.  Will treat with gabapentin as I suspect issue with the ear and inflammation here may have flared up her trigeminal neuralgia leading to more severe pain.  I called and discussed case with Dr. Jenne Pane with ENT, he feels that continuing p.o. Cipro is appropriate, no significantly increased bioavailability with IV ciprofloxacin.  Agrees that the perichondritis itself does not sound like it is clinically worsening.  Outpatient ENT  follow-up.  Prescribed gabapentin encourage patient to follow-up with neurology regarding trigeminal neuralgia as well.  Return precautions discussed.  Discharged home in good condition.        Final Clinical Impression(s) / ED Diagnoses Final diagnoses:  Perichondritis of auricle, right    Rx / DC Orders ED Discharge Orders          Ordered    ciprofloxacin (CIPRO) 500 MG tablet  Every 12 hours        04/17/23 1321    gabapentin (NEURONTIN) 300 MG capsule  3 times  daily        04/17/23 1321              Legrand Rams 04/23/23 1728    Linwood Dibbles, MD 05/06/23 [id number]

## 2023-04-29 ENCOUNTER — Telehealth: Payer: Self-pay | Admitting: Neurology

## 2023-04-29 ENCOUNTER — Encounter: Payer: Self-pay | Admitting: Neurology

## 2023-04-29 ENCOUNTER — Other Ambulatory Visit: Payer: Self-pay | Admitting: Neurology

## 2023-04-29 MED ORDER — GABAPENTIN 300 MG PO CAPS: 300.0000 mg | ORAL_CAPSULE | Freq: Three times a day (TID) | ORAL | 11 refills | Status: DC

## 2023-04-29 NOTE — Telephone Encounter (Signed)
Pt asked that a message be sent asking to be called to discuss a Trigeminal neuralgia flare up that she has had for a month

## 2023-04-29 NOTE — Telephone Encounter (Signed)
Pt is requesting a refill for gabapentin (NEURONTIN) 300 MG capsule.  Pharmacy: [facility] [id number]

## 2023-04-29 NOTE — Telephone Encounter (Signed)
Pt also sent mychart message about this 

## 2023-05-08 ENCOUNTER — Telehealth: Payer: Self-pay | Admitting: Neurology

## 2023-05-08 ENCOUNTER — Institutional Professional Consult (permissible substitution): Payer: 59 | Admitting: Neurology

## 2023-05-08 ENCOUNTER — Encounter: Payer: Self-pay | Admitting: Neurology

## 2023-05-08 NOTE — Telephone Encounter (Signed)
LVM and sent mychart msg informing pt of need to reschedule 05/08/23 appt - MD out

## 2023-05-14 ENCOUNTER — Encounter: Payer: Self-pay | Admitting: Neurology

## 2023-05-14 ENCOUNTER — Ambulatory Visit (INDEPENDENT_AMBULATORY_CARE_PROVIDER_SITE_OTHER): Payer: 59 | Admitting: Neurology

## 2023-05-14 VITALS — BP 115/67 | HR 76 | Ht 64.0 in | Wt 174.4 lb

## 2023-05-14 DIAGNOSIS — M542 Cervicalgia: Secondary | ICD-10-CM

## 2023-05-14 DIAGNOSIS — G529 Cranial nerve disorder, unspecified: Secondary | ICD-10-CM | POA: Diagnosis not present

## 2023-05-14 DIAGNOSIS — G500 Trigeminal neuralgia: Secondary | ICD-10-CM

## 2023-05-14 DIAGNOSIS — M5412 Radiculopathy, cervical region: Secondary | ICD-10-CM

## 2023-05-14 DIAGNOSIS — M47892 Other spondylosis, cervical region: Secondary | ICD-10-CM | POA: Diagnosis not present

## 2023-05-14 DIAGNOSIS — H9191 Unspecified hearing loss, right ear: Secondary | ICD-10-CM

## 2023-05-14 DIAGNOSIS — M5382 Other specified dorsopathies, cervical region: Secondary | ICD-10-CM | POA: Diagnosis not present

## 2023-05-14 DIAGNOSIS — M7918 Myalgia, other site: Secondary | ICD-10-CM

## 2023-05-14 DIAGNOSIS — H9201 Otalgia, right ear: Secondary | ICD-10-CM

## 2023-05-14 DIAGNOSIS — G8929 Other chronic pain: Secondary | ICD-10-CM

## 2023-05-14 DIAGNOSIS — R202 Paresthesia of skin: Secondary | ICD-10-CM

## 2023-05-14 DIAGNOSIS — M2480 Other specific joint derangements of unspecified joint, not elsewhere classified: Secondary | ICD-10-CM

## 2023-05-14 DIAGNOSIS — M5481 Occipital neuralgia: Secondary | ICD-10-CM

## 2023-05-14 DIAGNOSIS — R200 Anesthesia of skin: Secondary | ICD-10-CM

## 2023-05-14 MED ORDER — METHOCARBAMOL 750 MG PO TABS: 750.0000 mg | ORAL_TABLET | Freq: Four times a day (QID) | ORAL | 1 refills | Status: DC | PRN

## 2023-05-14 MED ORDER — TRAMADOL HCL 100 MG PO TABS: 100.0000 mg | ORAL_TABLET | Freq: Four times a day (QID) | ORAL | 1 refills | Status: DC | PRN

## 2023-05-14 NOTE — Progress Notes (Addendum)
[facility]    Provider:  Dr Lucia Gaskins Referring Provider: Sigmund Hazel, MD Primary Care Physician:  Sigmund Hazel, MD  CC:  Migraines  ADDENDUM 08/03/2023:  The primary treatment for trigeminal neuralgia is medication, typically anticonvulsant drugs like carbamazepine (Tegretol) or oxcarbazepine (Trileptal), which are often the first line of treatment and can provide significant pain relief for most patients; if medications are not effective enough, surgical options like microvascular decompression may be considered depending on the severity and cause of the pain.  Key points about trigeminal neuralgia treatment:  Medications: First-line therapy: Carbamazepine and oxcarbazepine are the preferred initial medications, often effective in managing pain for a majority of patients.   Other anticonvulsants: Gabapentin, lamotrigine, topiramate, phenytoin can also be used depending on individual needs.   Important consideration: Medications may need to be adjusted over time as effectiveness can decrease.  Surgical options (when medication is not sufficient):  Microvascular decompression: Considered the gold standard surgical procedure, where a blood vessel compressing the trigeminal nerve is gently moved away.  Percutaneous rhizotomy: A less invasive procedure that destroys a part of the trigeminal nerve to block pain signals.  Balloon compression: A minimally invasive procedure where a balloon is used to compress the trigeminal nerve.  Stereotactic radiosurgery: Uses radiation beams to target the trigeminal nerve.  Other potential treatments: Nerve blocks: Injections of anesthetic near the affected nerve for temporary pain relief.  Botulinum toxin injections: May be used in some cases to provide temporary pain relief.  Physical therapy: Techniques like massage or muscle relaxation may help manage pain   Addendum 07/07/2023:  Patient still having problems wit trigeminal nerve.  MRI of the brain with attention to the brain stem did not show any lesions to suggest MS but also was not able to visualize any vascular loops on the right will order MRI Face/Trigeminal protocol. Also her neck still hurts, MRI cervical spine without significant findings.  PLAN:  Dr. Terrilee Files for myofascial neck pain 2. MRI specific protocol right trigeminal neuralgia(MR Face/trigeminal look at the right TMJ for anything there) 3. Dr. Tempie Donning at [facility] for Trigeminal Neuralgia likely vascular loop after MRI is back can send MRI on disk up with the referral hopefully for microvascular decompression  05/14/2023: Lovely patient who sees [place] every three months for botox for migraines is here for her trigeminal neuralgia saw dr Anne Hahn in the past as wess and he is retired now.  I reviewed her notes/chart: Patient has a history of chronic migraines doing well on Botox for migraines, vitamin D deficiency, ADD, GAD, orthostasis, history of tobacco use, generalized abdominal pain irritable colon, hypocalcemia, altered bowel function, tingling of left upper extremity, colitis enteritis and gastroenteritis of presumed infectious origin, trigeminal neuralgia on the right side history of Bell's palsy x 2 saw Dr. Anne Hahn in the past, possible borderline fructose intolerance hydrogen breath test, neck and arm pain goes to the spine center or has gone in the past, vertigo, has followed with [facility] in the past for incomplete bundle branch block and had a stress test with Dr. Jacinto Halim which was normal.  She is on Wegovy Pristiq Klonopin Cipro Botox and on gabapentin her Dr. Rondel Baton notes last seen at [facility] by her primary care.  She had constipation on Wegovy and has abdominal problems.  Recent stress in her life as of May 20 appointment mammogram has been normal recently been experiencing breakthrough headaches which she attributes to stress.  General and physical exam was normal.  TSH was normal  2.33.    I reviewed Dr. Clarisa Kindred notes for trigeminal neuralgia on the left episodes of pain on and off for 3 to 4 years she underwent MRI scan in 2007 that was unremarkable and since then, patient noted onset of a right V3 distribution pain in October 2011, notes that cold air and sometimes chewing eating will bring on the pain, dull sensation of pain in the ear with occasional sharp jabs of pain in the jaw, similar as to what she had in the past, her last severe problem with pain was several years prior, she has responded well to gabapentin 300 mg 3 times a day, she was increased to 600 mg 3 times a day in the past, last exam by Dr. Hyacinth Meeker was normal neurologic cranial nerves motor coordination gait and station and reflexes diagnosed with trigeminal neuralgia however staining is on the right, also prescribe 600 mg of gabapentin but if not effective consider trial of prednisone and a repeat MRI of the brain was ordered, consider carbamazepine.  She had an infection in her ear after a piercing and she had antbx and went to ED as well and extended it and felt the nerve pain come back after the infection, she has pain in the neck and on the ear and the same pains in the face but what is different is she is having some shocks in the head. So it is different. No triggers. She doesn't know why, the pain is feels like her head is in a vice, she takes pain medicine tramadol that takes off the edge. The pain is still in the right jaw, she went to an ENT and looked in her ear and throat and she recommended a dentist because her tmj is tight she is in a lot of pain. Gabapentin has decreased sone of the shcks but the pain Is still there and having shocks in the ear is new she started seeing a chiropractor.   From a thorough review of records and patient self-reported history medications tried in the past include: Tylenol, Abilify, Botox regularly, Fioricet, gabapentin, Flexeril, Decadron, Pristiq, Cymbalta, Relpax,  Lexapro, Ajovy, gray lease, and gabapentin IR and gabapentin ER, hydromorphone, ibuprofen, ketorolac injections, Robaxin, methylprednisolone, Zofran, oxycodone, Paxil, prednisone oral, Phenergan, Nurtec, Imitrex, topiramate, tramadol, Zonegran . Points to a muscle in the neck: upper SCM and splenius capitus muscle pain. Tightness in the jaw. She has TMJ worse. Definitely the TGN is back with additional feautures of the others. She can't move her head. Steroids did not help. Hurts to move the neck. She got a cervical pillow. Musclular pain affecting the TGN and contributions from the TMJ and also occipital. Ear pain likely due to branch from troigeminal but also has irritation in the occipital disyribution   STUDY DATE: 04/09/17 PATIENT NAME: TENLY TROLINGER DOB: 1971-01-05 MRN: [id number]   ORDERING CLINICIAN: Butch Penny, NP / York Spaniel, MD  CLINICAL HISTORY: 52 year old female with headaches.   EXAM: MRI brain (with and without)  TECHNIQUE: MRI of the brain with and without contrast was obtained utilizing 5 mm axial slices with T1, T2, T2 flair, T2 star gradient echo and diffusion weighted views.  T1 sagittal, T2 coronal and postcontrast views in the axial and coronal plane were obtained. CONTRAST: 15ml magnevist  COMPARISON: CT 07/11/06  IMAGING SITE: [facility] [address] (1.5 Tesla MRI)    FINDINGS:    No abnormal lesions are seen on diffusion-weighted views to suggest acute  ischemia. The cortical sulci, fissures and cisterns are normal in size and appearance. Lateral, third and fourth ventricle are normal in size and appearance. No extra-axial fluid collections are seen. No evidence of mass effect or midline shift.  No abnormal lesions are seen on post contrast views.     On sagittal views the posterior fossa, pituitary gland and corpus callosum are unremarkable. No evidence of intracranial hemorrhage on gradient-echo views. The orbits and their contents, paranasal sinuses  and calvarium are unremarkable.  Intracranial flow voids are present.       IMPRESSION:  Normal MRI brain (with and without).  Patient complains of symptoms per HPI as well as the following symptoms: migraines . Pertinent negatives and positives per HPI. All others negative:   HPI:  BRYONY ROUGIER is a 52 y.o. female here as a referral from Dr. Hyacinth Meeker for chronic daily headache. Headaches daily since she was in her 20s headaches are bifrontal and bitemporal in nature and associated with a throbbing aching pain that is better in the morning, worse as the day goes on. Endorses photophobia and phonophobia but no nausea or vomiting. Advil appears to help. She also has a past medical history of trigeminal neuralgia on the right V3 distribution and has responded in the past to gabapentin. Started on Topamax by Dr. Anne Hahn. Patient could not tolerate the side effects of Topamax. Zonegran was started. Marland Kitchen MRI of the brain was normal. She has daily headaches. At least half of them are migrainous (15-20 in a month). No medication overuse. No aura. Migraines and headaches can last up to 24 hours and have frequently lasted will days in a row. Has had daily frequency for over a year.Headaches can last up to 24 hours.Worsening of migraines. One cup of coffee daily, no other caffeine, no artificial sweeteners, she has examined her diet, stays well-hydrated, sleeps well.  Medications tried: Topamax (did not tolerate due to paresthesias), Zonegran to 50 mg twice daily but developed itching on increased dose, prednisone dosepak, gabapentin, Imitrex(had reaction and pain), lexapro, propranolol, paxil  Reviewed notes, labs and imaging from outside physicians, which showed:  CBC and BMP unremarkable May 2017  Personally reviewed MRI of the brain images which were normal  [facility]ally reviewed [facility], this is documented in the history of present illness.   Review of Systems: Patient  complains of symptoms per HPI as well as the following symptoms: no CP, no SOB. Pertinent negatives and positives per HPI. All others negative.   Social History   Socioeconomic History   Marital status: Married    Spouse name: Not on file   Number of children: 4   Years of education: Master's   Highest education level: Not on file  Occupational History   Occupation: Geophysicist/field seismologist and Language  Tobacco Use   Smoking status: Former    Current packs/day: 0.00    Average packs/day: 0.3 packs/day for 20.0 years (5.0 ttl pk-yrs)    Types: Cigarettes    Start date: 08/26/1996    Quit date: 08/26/2016    Years since quitting: 6.7   Smokeless tobacco: Never  Vaping Use   Vaping status: Never Used  Substance and Sexual Activity   Alcohol use: Yes    Comment: occ   Drug use: No   Sexual activity: Yes    Birth control/protection: Surgical  Other Topics Concern   Not on file  Social History Narrative   Lives at home w/ her husband and  children   Right-handed   Caffeine: 1-2 cups per day   Social Determinants of Health   Financial Resource Strain: Not on file  Food Insecurity: Low Risk  (04/30/2023)   Received from [facility]   Hunger Vital Sign    Worried About Running Out of Food in the Last Year: Never true    Ran Out of Food in the Last Year: Never true  Transportation Needs: No Transportation Needs (04/30/2023)   Received from [facility]    In the past 12 months, has lack of reliable transportation kept you from medical appointments, meetings, work or from getting things needed for daily living? : No  Physical Activity: Not on file  Stress: Not on file  Social Connections: Unknown (12/16/2022)   Received from [facility]   Social Network    Social Network: Not on file  Intimate Partner Violence: Unknown (12/16/2022)   Received from [facility], [facility]   HITS    Physically Hurt: Not on file    Insult or Talk Down To: Not on  file    Threaten Physical Harm: Not on file    Scream or Curse: Not on file    Family History  Problem Relation Age of Onset   High blood pressure Mother    Migraines Mother    High Cholesterol Father    Aortic stenosis Father    Arrhythmia Father        Pacemaker   Breast cancer Maternal Grandmother    Breast cancer Maternal Aunt    Lung cancer Maternal Aunt    Mental retardation Daughter     Past Medical History:  Diagnosis Date   Anxiety    Asthma    Hx with pregnancy only - No inhaler- no current problems   Bell's palsy    at age 10 & age 22, no current problems   Common migraine with intractable migraine 07/29/2016   Depression    Fibromyalgia    IBS (irritable bowel syndrome)    diet controlled   Neuromuscular disorder (HCC)    trigeminal nuralga - not currently active    Past Surgical History:  Procedure Laterality Date   ABDOMINAL HYSTERECTOMY  2016   ABLATION     BREAST REDUCTION SURGERY Bilateral 08/12/2018   Procedure: BILATERAL MAMMARY REDUCTION  (BREAST);  Surgeon: Peggye Form, DO;  Location: [facility];  Service: Plastics;  Laterality: Bilateral;   ceserean section x 3     COLONOSCOPY     CYSTOSCOPY N/A 06/12/2015   Procedure: CYSTOSCOPY;  Surgeon: Noland Fordyce, MD;  Location: [facility];  Service: Gynecology;  Laterality: N/A;   DILATION AND CURETTAGE OF UTERUS     LAPAROSCOPIC LYSIS OF ADHESIONS  01/12/2018   Procedure: LAPAROSCOPIC LYSIS OF ADHESIONS;  Surgeon: Noland Fordyce, MD;  Location: [facility];  Service: Gynecology;;   LAPAROSCOPY N/A 01/12/2018   Procedure: Diagnostic Laparoscopy;  Surgeon: Noland Fordyce, MD;  Location: [facility];  Service: Gynecology;  Laterality: N/A;   REDUCTION MAMMAPLASTY Bilateral    ROBOTIC ASSISTED LAPAROSCOPIC LYSIS OF ADHESION  06/12/2015   Procedure: ROBOTIC ASSISTED LAPAROSCOPIC LYSIS OF ADHESION;  Surgeon: Noland Fordyce, MD;  Location: [facility];  Service: Gynecology;;   WISDOM TOOTH EXTRACTION       Current Outpatient Medications  Medication Sig Dispense Refill   botulinum toxin Type A (BOTOX) 200 units injection Inject 155 Units into the muscle every 3 (three) months. 1 each 3  clonazePAM (KLONOPIN) 0.5 MG tablet Take 0.25 mg by mouth daily as needed for anxiety.      desvenlafaxine (PRISTIQ) 100 MG 24 hr tablet Take 100 mg by mouth daily.     gabapentin (NEURONTIN) 300 MG capsule Take 1 capsule (300 mg total) by mouth 3 (three) times daily. 90 capsule 11   methocarbamol (ROBAXIN) 750 MG tablet Take 1 tablet (750 mg total) by mouth every 6 (six) hours as needed for muscle spasms. 120 tablet 1   Multiple Vitamin (MULTIVITAMIN WITH MINERALS) TABS tablet Take 1 tablet by mouth daily.     Rimegepant Sulfate (NURTEC) 75 MG TBDP Take 75 mg by mouth daily as needed. For migraines. Take as close to onset of migraine as possible. One daily maximum. 8 tablet 5   traMADol HCl 100 MG TABS Take 100 mg by mouth 4 (four) times daily as needed. 30 tablet 1   WEGOVY 1.7 MG/0.75ML SOAJ      No current facility-administered medications for this visit.   Facility-Administered Medications Ordered in Other Visits  Medication Dose Route Frequency Provider Last Rate Last Admin   gadopentetate dimeglumine (MAGNEVIST) injection 15 mL  15 mL Intravenous Once PRN Butch Penny, NP        Allergies as of 05/14/2023 - Review Complete 05/14/2023  Allergen Reaction Noted   Topamax [topiramate] Other (See Comments) 08/29/2016   Zonegran [zonisamide] Itching 04/23/2017    Vitals: BP 115/67   Pulse 76   Ht 5\' 4"  (1.626 m)   Wt 174 lb 6.4 oz (79.1 kg)   BMI 29.94 kg/m  Last Weight:  Wt Readings from Last 1 Encounters:  05/14/23 174 lb 6.4 oz (79.1 kg)   Last Height:   Ht Readings from Last 1 Encounters:  05/14/23 5\' 4"  (1.626 m)   Physical exam: Exam: Gen: NAD, conversant, well nourised,  well groomed                     CV: RRR, no MRG. No Carotid Bruits. No peripheral edema, warm,  nontender Eyes: Conjunctivae clear without exudates or hemorrhage  Neuro: Detailed Neurologic Exam  Speech:    Speech is normal; fluent and spontaneous with normal comprehension.  Cognition:    The patient is oriented to person, place, and time;     recent and remote memory intact;     language fluent;     normal attention, concentration,     fund of knowledge Cranial Nerves:    The pupils are equal, round, and reactive to light. No ONH edema. Visual fields are full . Extraocular movements are intact. Trigeminal sensation is intact and the muscles of mastication are normal. The face is symmetric. The palate elevates in the midline. Hearing intact. Voice is normal. Shoulder shrug is normal. The tongue has normal motion without fasciculations.   Coordination:    Normal finger to nose and heel to shin. Normal rapid alternating movements.   Gait:    Heel-toe and tandem gait are normal.   Motor Observation:    No asymmetry, no atrophy, and no involuntary movements noted. Tone:    Normal muscle tone.    Posture:    Posture is normal. normal erect    Strength:    Strength is V/V in the upper and lower limbs.      Sensation: intact to LT     Reflex Exam:  DTR's:    Deep tendon reflexes in the upper and lower extremities are normal bilaterally.  Toes:    The toes are downgoing bilaterally.   Clonus:    Clonus is absent.    Assessment/Plan:  52 year old patient with chronic migraines without aura intractable with status migrainosus who has failed multiple preventative medications, no medication overuse, daily headaches with at least 20 a month migrainous for over a year. Had a long discussion about migraine management including acute and chronic management. At this point I do feel that she would be a good candidate for Botox having failed multiple medications and medication classes. Also discussed with new CGRP medications for migraine preventative. At this point we'll increase  her gabapentin in an extended release formulation start with 300 mg and increase to 600 mg at bedtime. Also discussed possibly starting candesartan which has recently shown strong evidence as migraine preventative however her blood pressure is on the lower side, we'll need to watch that. Carefully, discussed side effects such as lightheadedness, dizziness, chest pain and risks for bradycardia and arrhythmias.  She has only accepted gabapentin and tramadol, I hesitate to keep providing tramadol.  ADDENDUM 08/03/2023:  The primary treatment for trigeminal neuralgia is medication, typically anticonvulsant drugs like carbamazepine (Tegretol) or oxcarbazepine (Trileptal), which are often the first line of treatment and can provide significant pain relief for most patients; if medications are not effective enough, surgical options like microvascular decompression may be considered depending on the severity and cause of the pain.  Key points about trigeminal neuralgia treatment:  Medications: First-line therapy: Carbamazepine and oxcarbazepine are the preferred initial medications, often effective in managing pain for a majority of patients.   Other anticonvulsants: Gabapentin, lamotrigine, topiramate, phenytoin can also be used depending on individual needs.   Important consideration: Medications may need to be adjusted over time as effectiveness can decrease.  Surgical options (when medication is not sufficient):  Microvascular decompression: Considered the gold standard surgical procedure, where a blood vessel compressing the trigeminal nerve is gently moved away.  Percutaneous rhizotomy: A less invasive procedure that destroys a part of the trigeminal nerve to block pain signals.  Balloon compression: A minimally invasive procedure where a balloon is used to compress the trigeminal nerve.  Stereotactic radiosurgery: Uses radiation beams to target the trigeminal nerve.  Other potential  treatments: Nerve blocks: Injections of anesthetic near the affected nerve for temporary pain relief.  Botulinum toxin injections: May be used in some cases to provide temporary pain relief.  Physical therapy: Techniques like massage or muscle relaxation may help manage pain     (Addendum 07/07/2023:  Patient still having problems wit trigeminal nerve. MRI of the brain with attention to the brain stem did not show any lesions to suggest MS but also was not able to visualize any vascular loops on the right will order MRI Face/Trigeminal protocol. Also her neck still hurts, MRI cervical spine without significant findings.  PLAN: Dr. Terrilee Files for myofascial neck pain 2. MRI specific protocol right trigeminal neuralgia(MR Face/trigeminal look at the right TMJ for anything there) 3. Dr. Tempie Donning at [facility] for Trigeminal Neuralgia likely vascular loop after MRI is back can send MRI on disk up with the referral hopefully for microvascular decompression)   -recurrence of TGN in the setting of cervical myofascial pain and neck pain and decreased ROM of neck with cervical muscle pain. Dr. Rica Mote noted left in the patient, currently on the right. Responded well to gabapentin 600mg  tid in the past. Last mri brain normal 2018 but I do not see any mris of the face with  trigeminal protocol will consider if no improvement with addressing the neck pain nad performing imaging for invasive procedures such as c2-c3 medial brainch blocks or radiofrequency ablation of the occipital nerve or ESI need imaging  -Could try prednisone temporarily, could increase gabapentin if needed and tolerated, could try Lamictal and baclofen or Tegretol which is a first-line agent however has side effects which is why sometimes I try Lamictal first which is also good for mood.  - MRI brain which goes down to C3 w/wo contrast thin cuts throught the IAC to ensure no other etiology of trigeminal neuralgia such as compressive mass or  brain stem stroke  Muscle relaxers Continue heat Voltaren gel into the area  Acupuncture integrative therapies for this and any other modality, myofascial release. Lidocaine patches 4% or salon paas which are icy-hot type  Orders Placed This Encounter  Procedures   MR CERVICAL SPINE WO CONTRAST   MR BRAIN W WO CONTRAST   Ambulatory referral to Physical Therapy   Meds ordered this encounter  Medications   methocarbamol (ROBAXIN) 750 MG tablet    Sig: Take 1 tablet (750 mg total) by mouth every 6 (six) hours as needed for muscle spasms.    Dispense:  120 tablet    Refill:  1   traMADol HCl 100 MG TABS    Sig: Take 100 mg by mouth 4 (four) times daily as needed.    Dispense:  30 tablet    Refill:  1     Discussed: To prevent or relieve headaches, try the following: Cool Compress. Lie down and place a cool compress on your head.  Avoid headache triggers. If certain foods or odors seem to have triggered your migraines in the past, avoid them. A headache diary might help you identify triggers.  Include physical activity in your daily routine. Try a daily walk or other moderate aerobic exercise.  Manage stress. Find healthy ways to cope with the stressors, such as delegating tasks on your to-do list.  Practice relaxation techniques. Try deep breathing, yoga, massage and visualization.  Eat regularly. Eating regularly scheduled meals and maintaining a healthy diet might help prevent headaches. Also, drink plenty of fluids.  Follow a regular sleep schedule. Sleep deprivation might contribute to headaches Consider biofeedback. With this mind-body technique, you learn to control certain bodily functions -- such as muscle tension, heart rate and blood pressure -- to prevent headaches or reduce headache pain.    Proceed to emergency room if you experience new or worsening symptoms or symptoms do not resolve, if you have new neurologic symptoms or if headache is severe, or for any concerning  symptom.   Provided education and documentation from American headache Society toolbox including articles on: chronic migraine medication overuse headache, chronic migraines, prevention of migraines, behavioral and other nonpharmacologic treatments for headache.    Naomie Dean, MD  [facility] [address] [place], [place] [address]  Phone [phone] Fax [phone] I spent over 45 minutes of face-to-face and non-face-to-face time with patient on the  1. Chronic neck pain   2. Decreased range of motion of intervertebral discs of cervical spine   3. Cervical radiculopathy   4. Other osteoarthritis of spine, cervical region   5. Trigeminal neuralgia pain   6. Cranial nerve disease   7. Crepitus of cervical spine   8. Bilateral occipital neuralgia   9. Cervical myofascial pain syndrome   10. Numbness and tingling of right face   11. Right ear  pain   12. Change in hearing of right ear    diagnosis.  This included previsit chart review, lab review, study review, order entry, electronic health record documentation, patient education on the different diagnostic and therapeutic options, counseling and coordination of care, risks and benefits of management, compliance, or risk factor reduction

## 2023-05-14 NOTE — Patient Instructions (Addendum)
MRI brain which goes down to C3 w/wo contrast thin cuts throught the IAC to ensure no other etiology Muscle relaxers Continue heat Voltaren gel into the area  Acupuncture integrative therapies for this and any other modality, myofascial release. Lidocaine patches 4% or salon paas which are icy-hot type       Tramadol Methocarbamol Tablets What is this medication? METHOCARBAMOL (meth oh KAR ba mole) treats muscle pain and stiffness. It works by calming overactive nerves in your body, which helps your muscles relax. It belongs to a group of medications called muscle relaxants. This medicine may be used for other purposes; ask your health care provider or pharmacist if you have questions. COMMON BRAND NAME(S): Robaxin What should I tell my care team before I take this medication? They need to know if you have any of these conditions: Kidney disease Seizures An unusual or allergic reaction to methocarbamol, other medications, foods, dyes, or preservatives Pregnant or trying to get pregnant Breast-feeding How should I use this medication? Take this medication by mouth with a full glass of water. Follow the directions on the prescription label. Take your medication at regular intervals. Do not take your medication more often than directed. Talk to your care team about the use of this medication in children. Special care may be needed. Overdosage: If you think you have taken too much of this medicine contact a poison control center or emergency room at once. NOTE: This medicine is only for you. Do not share this medicine with others. What if I miss a dose? If you miss a dose, take it as soon as you can. If it is almost time for your next dose, take only the next dose. Do not take double or extra doses. What may interact with this medication? Do not take this medication with any of the following: Opioid medications for cough This medication may also interact with the  following: Alcohol Antihistamines for allergy, cough, and cold Certain medications for anxiety or sleep Certain medications for depression, such as amitriptyline, fluoxetine, or sertraline Certain medications for seizures, such as phenobarbital or primidone Cholinesterase inhibitors, such as neostigmine, ambenonium, or pyridostigmine bromide General anesthetics, such as halothane, isoflurane, methoxyflurane, or propofol Local anesthetics, such as lidocaine, pramoxine, or tetracaine Medications that relax muscles for surgery Opioids Phenothiazines, such as chlorpromazine, mesoridazine, prochlorperazine, thioridazine This list may not describe all possible interactions. Give your health care provider a list of all the medicines, herbs, non-prescription drugs, or dietary supplements you use. Also tell them if you smoke, drink alcohol, or use illegal drugs. Some items may interact with your medicine. What should I watch for while using this medication? Visit your care team for regular checks on your progress. Tell your care team if your symptoms do not start to get better or if they get worse. This medication may affect your coordination, reaction time, or judgment. Do not drive or operate machinery until you know how this medication affects you. Sit up or stand slowly to reduce the risk of dizzy or fainting spells. Drinking alcohol with this medication can increase the risk of these side effects. Taking this medication with other substances that cause drowsiness, such as alcohol, benzodiazepines, or opioids can cause serious side effects. Give your care team a list of all medications you use. They will tell you how much medication to take. Do not take more medication than directed. Call emergency services if you have problems breathing or staying awake. What side effects may I notice from receiving  this medication? Side effects that you should report to your care team as soon as possible: Allergic  reactions--skin rash, itching, hives, swelling of the face, lips, tongue, or throat CNS depression--slow or shallow breathing, shortness of breath, feeling faint, dizziness, confusion, trouble staying awake Side effects that usually do not require medical attention (report to your care team if they continue or are bothersome): Dizziness Drowsiness Headache Metallic taste in mouth Upset stomach This list may not describe all possible side effects. Call your doctor for medical advice about side effects. You may report side effects to FDA at 1-800-FDA-1088. Where should I keep my medication? Keep out of the reach of children. Store at room temperature between 20 and 25 degrees C (68 and 77 degrees F). Keep container tightly closed. Throw away any unused medication after the expiration date. NOTE: This sheet is a summary. It may not cover all possible information. If you have questions about this medicine, talk to your doctor, pharmacist, or health care provider.  2024 Elsevier/Gold Standard (2022-02-20 00:00:00) Tramadol Tablets What is this medication? TRAMADOL (TRA ma dole) treats severe pain. It is prescribed when other pain medications have not worked or cannot be tolerated. It works by blocking pain signals in the brain. It belongs to a group of medications called opioids. This medicine may be used for other purposes; ask your health care provider or pharmacist if you have questions. COMMON BRAND NAME(S): Ultram What should I tell my care team before I take this medication? They need to know if you have any of these conditions: Brain tumor Frequently drink alcohol Head injury Kidney disease Liver disease Lung or breathing disease, such as asthma Seizures Stomach or intestine problems Substance use disorder Suicidal thoughts, plans, or attempt by you or a family member Taken an MAOI, such as Marplan, Nardil, or Parnate in the last 14 days An unusual or allergic reaction to tramadol,  other medications, foods, dyes, or preservatives Pregnant or trying to get pregnant Breastfeeding How should I use this medication? Take this medication by mouth with a full glass of water. Take it as directed on the prescription label. You can take it with or without food. If it upsets your stomach, take it with food. Do not take it more often than directed. A special MedGuide will be given to you by the pharmacist with each prescription and refill. Be sure to read this information carefully each time. Talk to your care team about the use of this medication in children. Special care may be needed. Overdosage: If you think you have taken too much of this medicine contact a poison control center or emergency room at once. NOTE: This medicine is only for you. Do not share this medicine with others. What if I miss a dose? If you miss a dose, take it as soon as you can. If it is almost time for your next dose, take only that dose. Do not take double or extra doses. What may interact with this medication? Do not take this medication with any of the following: Linezolid MAOIs, such as Marplan, Nardil, and Parnate Methylene blue Ozanimod This medication may also interact with the following: Alcohol Antihistamines for allergy, cough, and cold Atropine Certain antibiotics, such as erythromycin, clarithromycin, rifampin Certain antivirals for HIV or hepatitis Certain medications for anxiety or sleep Certain medications for bladder problems, such as oxybutynin, tolterodine Certain medications for depression, such as amitriptyline, bupropion, fluoxetine, paroxetine, sertraline Certain medications for fungal infections, such as ketoconazole, itraconazole, or posaconazole  Certain medications for migraine headache, such as almotriptan, eletriptan, frovatriptan, naratriptan, rizatriptan, sumatriptan, zolmitriptan Certain medications for Parkinson disease, such as benztropine, trihexyphenidyl Certain  medications for seizures, such as carbamazepine, phenobarbital, primidone Certain medications for stomach problems, such as dicyclomine, hyoscyamine Certain medications for travel sickness, such as scopolamine Digoxin Diuretics General anesthetics, such as halothane, isoflurane, methoxyflurane, propofol Ipratropium Medications that relax muscles for surgery Other opioid medications for pain Phenothiazines, such as chlorpromazine, mesoridazine, prochlorperazine, thioridazine Quinidine Warfarin This list may not describe all possible interactions. Give your health care provider a list of all the medicines, herbs, non-prescription drugs, or dietary supplements you use. Also tell them if you smoke, drink alcohol, or use illegal drugs. Some items may interact with your medicine. What should I watch for while using this medication? Tell your care team if your pain does not go away, if it gets worse, or if you have new or a different type of pain. You may develop tolerance to this medication. Tolerance means that you will need a higher dose of the medication for pain relief. Tolerance is normal and is expected if you take this medication for a long time. Taking this medication with other substances that cause drowsiness, such as alcohol, benzodiazepines, or other opioids can cause serious side effects. Give your care team a list of all medications you use. They will tell you how much medication to take. Do not take more medication than directed. Call emergency services if you have problems breathing or staying awake. Long term use of this medication may cause your brain and body to depend on it. This can happen even when used as directed by your care team. You and your care team will work together to determine how long you will need to take this medication. If your care team wants you to stop this medication, the dose will be slowly lowered over time to reduce the risk of side effects. Naloxone is an  emergency medication used for an opioid overdose. An overdose can happen if you take too much of an opioid. It can also happen if an opioid is taken with some other medications or substances such as alcohol. Know the symptoms of an overdose, such as trouble breathing, unusually tired or sleepy, or not being able to respond or wake up. Make sure to tell caregivers and close contacts where your naloxone is stored. Make sure they know how to use it. After naloxone is given, the person giving it must call emergency services. Naloxone is a temporary treatment. Repeat doses may be needed. This medication may affect your coordination, reaction time, or judgment. Do not drive or operate machinery until you know how this medication affects you. Sit up or stand slowly to reduce the risk of dizzy or fainting spells. Drinking alcohol with this medication can increase the risk of these side effects. This medication may cause serious skin reactions. They can happen weeks to months after starting the medication. Contact your care team right away if you notice fevers or flu-like symptoms with a rash. The rash may be red or purple and then turn into blisters or peeling of the skin. Or, you might notice a red rash with swelling of the face, lips, or lymph nodes in your neck or under your arms. This medication will cause constipation. If you do not have a bowel movement for 3 days, call your care team. Your mouth may get dry. Chewing sugarless gum or sucking hard candy and drinking plenty of water may help.  Contact your care team if the problem does not go away or is severe. Talk to your care team if you may be pregnant. Prolonged use of this medication during pregnancy can cause temporary withdrawal in a newborn. Talk to your care team before breastfeeding. Changes to your treatment plan may be needed. If you breastfeed while taking this medication, seek medical care right away if you notice the child has slow or noisy  breathing, is unusually sleepy or not able to wake up, or is limp. Long-term use of this medication may cause infertility. Talk to your care team if you are concerned about your fertility. What side effects may I notice from receiving this medication? Side effects that you should report to your care team as soon as possible: Allergic reactions--skin rash, itching, hives, swelling of the face, lips, tongue, or throat CNS depression--slow or shallow breathing, shortness of breath, feeling faint, dizziness, confusion, difficulty staying awake Low adrenal gland function--nausea, vomiting, loss of appetite, unusual weakness or fatigue, dizziness Low blood pressure--dizziness, feeling faint or lightheaded, blurry vision Low blood sugar (hypoglycemia)--tremors or shaking, anxiety, sweating, cold or clammy skin, confusion, dizziness, rapid heartbeat Low sodium level--muscle weakness, fatigue, dizziness, headache, confusion Redness, blistering, peeling, or loosening of the skin, including inside the mouth Seizures Side effects that usually do not require medical attention (report to your care team if they continue or are bothersome): Constipation Dizziness Drowsiness Dry mouth Headache Nausea Vomiting This list may not describe all possible side effects. Call your doctor for medical advice about side effects. You may report side effects to FDA at 1-800-FDA-1088. Where should I keep my medication? Keep out of the reach of children and pets. This medication can be abused. Keep it in a safe place to protect it from theft. Do not share it with anyone. It is only for you. Selling or giving away this medication is dangerous and against the law. Store at room temperature between 20 and 25 degrees C (68 and 77 degrees F). Get rid of any unused medication after the expiration date. This medication may cause harm and death if it is taken by other adults, children, or pets. It is important to get rid of the  medication as soon as you no longer need it, or it is expired. You can do this in two ways: Take the medication to a medication take-back program. Check with your pharmacy or law enforcement to find a location. If you cannot return the medication, check the label or package insert to see if the medication should be thrown out in the garbage or flushed down the toilet. If you are not sure, ask your care team. If it is safe to put it in the trash, take the medication out of the container. Mix the medication with cat litter, dirt, coffee grounds, or other unwanted substance. Seal the mixture in a bag or container. Put it in the trash. NOTE: This sheet is a summary. It may not cover all possible information. If you have questions about this medicine, talk to your doctor, pharmacist, or health care provider.  2024 Elsevier/Gold Standard (2022-09-12 00:00:00)

## 2023-05-15 ENCOUNTER — Telehealth: Payer: Self-pay | Admitting: Neurology

## 2023-05-15 NOTE — Telephone Encounter (Signed)
The patient left me a voice mail asking to schedule her MRI, but there is no order placed for her yet.

## 2023-05-18 NOTE — Telephone Encounter (Signed)
Ordered thank you. I ordered both an mri brain and also an mri cervical spine which may be better to look at her neck in addition to her brain thank you

## 2023-05-20 ENCOUNTER — Telehealth: Payer: Self-pay | Admitting: Neurology

## 2023-05-20 NOTE — Telephone Encounter (Signed)
Called patient and got her MRI scheduled for next week at [facility].

## 2023-05-20 NOTE — Telephone Encounter (Signed)
Referral sent to integrative therapies [phone]

## 2023-05-28 ENCOUNTER — Ambulatory Visit (INDEPENDENT_AMBULATORY_CARE_PROVIDER_SITE_OTHER): Payer: 59

## 2023-05-28 DIAGNOSIS — H9201 Otalgia, right ear: Secondary | ICD-10-CM

## 2023-05-28 DIAGNOSIS — G529 Cranial nerve disorder, unspecified: Secondary | ICD-10-CM

## 2023-05-28 DIAGNOSIS — M542 Cervicalgia: Secondary | ICD-10-CM

## 2023-05-28 DIAGNOSIS — M2480 Other specific joint derangements of unspecified joint, not elsewhere classified: Secondary | ICD-10-CM

## 2023-05-28 DIAGNOSIS — M47892 Other spondylosis, cervical region: Secondary | ICD-10-CM

## 2023-05-28 DIAGNOSIS — G500 Trigeminal neuralgia: Secondary | ICD-10-CM

## 2023-05-28 DIAGNOSIS — H9191 Unspecified hearing loss, right ear: Secondary | ICD-10-CM

## 2023-05-28 DIAGNOSIS — R200 Anesthesia of skin: Secondary | ICD-10-CM | POA: Diagnosis not present

## 2023-05-28 DIAGNOSIS — M5382 Other specified dorsopathies, cervical region: Secondary | ICD-10-CM

## 2023-05-28 DIAGNOSIS — M5412 Radiculopathy, cervical region: Secondary | ICD-10-CM | POA: Diagnosis not present

## 2023-05-28 DIAGNOSIS — G8929 Other chronic pain: Secondary | ICD-10-CM

## 2023-05-28 DIAGNOSIS — R202 Paresthesia of skin: Secondary | ICD-10-CM

## 2023-05-28 MED ORDER — GADOBENATE DIMEGLUMINE 529 MG/ML IV SOLN
15.0000 mL | Freq: Once | INTRAVENOUS | Status: AC | PRN
  Administered 2023-05-28: 15 mL via INTRAVENOUS

## 2023-05-29 ENCOUNTER — Encounter: Payer: Self-pay | Admitting: Neurology

## 2023-05-31 ENCOUNTER — Other Ambulatory Visit: Payer: Self-pay | Admitting: Neurology

## 2023-06-04 ENCOUNTER — Other Ambulatory Visit: Payer: Self-pay | Admitting: Neurology

## 2023-06-23 NOTE — Telephone Encounter (Signed)
Delivery pending pt consent. 

## 2023-06-24 ENCOUNTER — Encounter: Payer: Self-pay | Admitting: Neurology

## 2023-06-24 MED ORDER — TRAMADOL HCL 100 MG PO TABS: 100.0000 mg | ORAL_TABLET | Freq: Four times a day (QID) | ORAL | 0 refills | Status: DC | PRN

## 2023-06-24 NOTE — Telephone Encounter (Signed)
I spoke with the pharmacy. They have gabapentin refills on file.  The patient filled her Tramadol refills on 9/18 and 9/26. Will have to discuss pt's request with a work-in doctor since Dr Lucia Gaskins is out of the office.

## 2023-07-07 ENCOUNTER — Ambulatory Visit (INDEPENDENT_AMBULATORY_CARE_PROVIDER_SITE_OTHER): Payer: 59 | Admitting: Neurology

## 2023-07-07 DIAGNOSIS — G43711 Chronic migraine without aura, intractable, with status migrainosus: Secondary | ICD-10-CM | POA: Diagnosis not present

## 2023-07-07 DIAGNOSIS — M7918 Myalgia, other site: Secondary | ICD-10-CM

## 2023-07-07 DIAGNOSIS — G501 Atypical facial pain: Secondary | ICD-10-CM

## 2023-07-07 DIAGNOSIS — G500 Trigeminal neuralgia: Secondary | ICD-10-CM

## 2023-07-07 DIAGNOSIS — H9201 Otalgia, right ear: Secondary | ICD-10-CM

## 2023-07-07 DIAGNOSIS — G518 Other disorders of facial nerve: Secondary | ICD-10-CM

## 2023-07-07 MED ORDER — ONABOTULINUMTOXINA 200 UNITS IJ SOLR
155.0000 [IU] | Freq: Once | INTRAMUSCULAR | Status: AC
  Administered 2023-07-07: 155 [IU] via INTRAMUSCULAR

## 2023-07-07 NOTE — Progress Notes (Signed)
Botox- 200 units x 1 vial Lot: [id number] Expiration: 10/2025 NDC: 9528-4132-44  Bacteriostatic 0.9% Sodium Chloride- 4ml Lot: [id number] Expiration: 11/25/2023 NDC: 5366-4403-47  Dx: G43.711 S/P  Witnessed by Delmer Islam

## 2023-07-07 NOTE — Progress Notes (Addendum)
Consent Form Botulism Toxin Injection For Chronic Migraine    She has only accepted gabapentin and tramadol, I hesitate to keep providing tramadol.  ADDENDUM 08/03/2023:  The primary treatment for trigeminal neuralgia is medication, typically anticonvulsant drugs like carbamazepine (Tegretol) or oxcarbazepine (Trileptal), which are often the first line of treatment and can provide significant pain relief for most patients; if medications are not effective enough, surgical options like microvascular decompression may be considered depending on the severity and cause of the pain.  Key points about trigeminal neuralgia treatment:  Medications: First-line therapy: Carbamazepine and oxcarbazepine are the preferred initial medications, often effective in managing pain for a majority of patients.   Other anticonvulsants: Gabapentin, lamotrigine, topiramate, phenytoin can also be used depending on individual needs.   Important consideration: Medications may need to be adjusted over time as effectiveness can decrease.  Surgical options (when medication is not sufficient):  Microvascular decompression: Considered the gold standard surgical procedure, where a blood vessel compressing the trigeminal nerve is gently moved away.  Percutaneous rhizotomy: A less invasive procedure that destroys a part of the trigeminal nerve to block pain signals.  Balloon compression: A minimally invasive procedure where a balloon is used to compress the trigeminal nerve.  Stereotactic radiosurgery: Uses radiation beams to target the trigeminal nerve.  Other potential treatments: Nerve blocks: Injections of anesthetic near the affected nerve for temporary pain relief.  Botulinum toxin injections: May be used in some cases to provide temporary pain relief.  Physical therapy: Techniques like massage or muscle relaxation may help manage pain    07/07/2023: stable. She feels her clenching causes her migraines, +5  masseters each also place the botox high in the forehead.  Patient still having problems wit trigeminal nerve. MRI of the brain with attention to the brain stem did not show any lesions to suggest MS but also was not able to visualize any vascular loops on the right will order MRI Face/Trigeminal protocol. Also her neck still hurts, MRI cervical spine without significant findings.  PLAN:  Dr. Terrilee Files for myofascial neck pain 2. MRI specific protocol right trigeminal neuralgia(MR Face/trigeminal look at the right TMJ for anything there) 3. Dr. 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 We can push out botox to 16-20 weeks and see how she does.  try nurtec.    07/04/2021: Doing exceptionally well! Has 4 kids. First daughter off to [place]. Twins are 38. she has been able to go 20 weeks without a migraine. Tremendous improvement. She has done tremendously well with NO MIGRAINES since last botox injection, she almost doesn't remember what it feels like to have headaches. Baseline headache frequency is daily headaches with 15 migrainous.  100% reduction in frequency and severity of headaches and migraines.   Consent Form Botulism Toxin Injection For Chronic Migraine    Reviewed orally with patient, additionally signature is on file:  Botulism toxin has been approved by the Federal drug administration for treatment of chronic migraine. Botulism toxin does not cure chronic migraine and it may not be  effective in some patients.  The administration of  botulism toxin is accomplished by injecting a small amount of toxin into the muscles of the neck and head. Dosage must be titrated for each individual. Any benefits resulting from botulism toxin tend to wear off after 3 months with a repeat injection required if benefit is to be maintained. Injections are usually done every 3-4 months with maximum effect peak achieved by about 2 or 3 weeks. Botulism toxin is expensive and you should be sure of what costs you will incur resulting from the injection.  The side effects of botulism toxin use for chronic migraine may include:   -Transient, and usually mild, facial weakness with facial injections  -Transient, and usually mild, head or neck weakness with head/neck injections  -Reduction or loss of forehead facial animation due to forehead muscle weakness  -Eyelid drooping  -Dry eye  -Pain at the site of injection or bruising at the site of injection  -Double vision  -Potential unknown long term risks  Contraindications: You should not have Botox if you are pregnant, nursing, allergic to albumin, have an infection, skin condition, or muscle weakness at the site of the injection, or have myasthenia gravis, Lambert-Eaton syndrome, or ALS.  It is also possible that as with any injection, there may be an allergic reaction or no effect from the medication. Reduced effectiveness after repeated injections is sometimes seen and rarely infection at the injection site may occur. All care will be taken to prevent these side effects. If therapy is given over a long time, atrophy and wasting in the muscle injected may occur. Occasionally the patient's become refractory to treatment because they develop antibodies to the toxin. In this event, therapy needs to be modified.  I have read the above information and consent to the administration of botulism toxin.    BOTOX PROCEDURE NOTE FOR MIGRAINE HEADACHE    Contraindications and precautions discussed with  patient(above). Aseptic procedure was observed and patient tolerated procedure. Procedure performed by Dr. Artemio Aly  The condition has existed for more than 6 months, and pt does not have a diagnosis of ALS, Myasthenia Gravis or Lambert-Eaton Syndrome.  Risks and benefits of injections discussed and pt agrees to proceed with the procedure.  Written consent obtained  These injections are medically necessary. Pt  receives good benefits from these injections. These injections do not cause sedations or hallucinations which the oral therapies may cause.  Description of procedure:  The patient was placed in a sitting position. The standard protocol was used for Botox as follows, with 5 units of Botox injected at each site:   -Procerus muscle, midline injection  -Corrugator muscle, bilateral injection  -Frontalis muscle, bilateral injection, with 2 sites each side, medial injection was performed in the upper one third of the frontalis muscle, in the region vertical from the medial inferior edge of the superior orbital rim. The lateral injection was again in the upper one third of the forehead vertically above the lateral limbus of the cornea, 1.5 cm lateral to the medial injection site.  -Temporalis muscle injection, 4 sites, bilaterally. The first injection was 3 cm above the tragus of the ear, second injection site was 1.5 cm to 3 cm up from the first injection site in line with the tragus of the ear. The third injection site was 1.5-3 cm forward between the first 2 injection sites. The fourth injection site was 1.5 cm posterior to the second injection site.   -Occipitalis muscle injection,  3 sites, bilaterally. The first injection was done one half way between the occipital protuberance and the tip of the mastoid process behind the ear. The second injection site was done lateral and superior to the first, 1 fingerbreadth from the first injection. The third injection site was 1 fingerbreadth  superiorly and medially from the first injection site.  -Cervical paraspinal muscle injection, 2 sites, bilateral knee first injection site was 1 cm from the midline of the cervical spine, 3 cm inferior to the lower border of the occipital protuberance. The second injection site was 1.5 cm superiorly and laterally to the first injection site.  -Trapezius muscle injection was performed at 3 sites, bilaterally. The first injection site was in the upper trapezius muscle halfway between the inflection point of the neck, and the acromion. The second injection site was one half way between the acromion and the first injection site. The third injection was done between the first injection site and the inflection point of the neck.   Will return for repeat injection in 3 months.   155 units of Botox was used, 45u Botox not injected was wasted. The patient tolerated the procedure well, there were no complications of the above procedure.

## 2023-07-07 NOTE — Patient Instructions (Addendum)
Dr. Terrilee Files for myofascial neck pain MRI specific protocol right trigeminal neuralgia(MR Face/trigeminal look at the right TMJ for anything there) Dr. 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

## 2023-07-09 ENCOUNTER — Telehealth: Payer: Self-pay | Admitting: Neurology

## 2023-07-09 NOTE — Telephone Encounter (Signed)
Pt scheduled for 75 mins MR face/trigeminal w/wo contrast at [facility] for 07/16/23 at 1pm  [facility] NPR case# [id number]

## 2023-07-14 ENCOUNTER — Other Ambulatory Visit: Payer: Self-pay | Admitting: Neurology

## 2023-07-14 ENCOUNTER — Encounter: Payer: Self-pay | Admitting: Neurology

## 2023-07-15 ENCOUNTER — Telehealth: Payer: Self-pay

## 2023-07-15 ENCOUNTER — Other Ambulatory Visit (HOSPITAL_COMMUNITY): Payer: Self-pay

## 2023-07-15 MED ORDER — TRAMADOL HCL 100 MG PO TABS: 100.0000 mg | ORAL_TABLET | Freq: Four times a day (QID) | ORAL | 0 refills | Status: DC | PRN

## 2023-07-15 NOTE — Telephone Encounter (Signed)
Last seen on 07/07/23 Follow up scheduled on 09/29/23 Last filled on 06/24/23 Rx pending to be signed    Patient send a message attached to Rx refill stating " Patient Comment: I still need pain med 1-2 times a day to deal with the pain."

## 2023-07-15 NOTE — Telephone Encounter (Signed)
  Pharmacy Patient Advocate Encounter   Received notification from CoverMyMeds that prior authorization for Tramadol 100MG  Tablets is required/requested.   Insurance verification completed.   The patient is insured through [facility] .   Per test claim:  Tramadol 50MG  Tablet is preferred by the insurance.  If suggested medication is appropriate, Please send in a new RX and discontinue this one. If not, please advise as to why it's not appropriate so that we may request a Prior Authorization.

## 2023-07-15 NOTE — Telephone Encounter (Signed)
I called Shari Booth.  From Pharmacy insurance only allowing her to take 200mg  daily. Needs PA.  Not sure how much she is taking of tramadol.  She has picked up 05-14-2023 #30, 05-22-2023 #30 and then 10/29 #30 at $47.67 per fill (using savings card). Needing PA for taking more then 2/day.

## 2023-07-15 NOTE — Telephone Encounter (Signed)
I called pt and she is taking 1-2 tabs prn daily of the tramadol 100mg  tabs.  As per below her insurance prefers 50mg  tablets.   I responded to her also about waiting for MRI prior to Dr. Wilmon Arms referral per Dr. Trevor Mace note. Her MRI is scheduled for tomorrow.  Pt verbalized understanding.

## 2023-07-16 ENCOUNTER — Other Ambulatory Visit: Payer: Self-pay | Admitting: Neurology

## 2023-07-16 ENCOUNTER — Ambulatory Visit: Payer: 59

## 2023-07-16 ENCOUNTER — Encounter: Payer: Self-pay | Admitting: Neurology

## 2023-07-16 DIAGNOSIS — G500 Trigeminal neuralgia: Secondary | ICD-10-CM

## 2023-07-16 DIAGNOSIS — H9201 Otalgia, right ear: Secondary | ICD-10-CM

## 2023-07-16 DIAGNOSIS — G501 Atypical facial pain: Secondary | ICD-10-CM | POA: Diagnosis not present

## 2023-07-16 DIAGNOSIS — G518 Other disorders of facial nerve: Secondary | ICD-10-CM | POA: Diagnosis not present

## 2023-07-16 MED ORDER — METHOCARBAMOL 750 MG PO TABS: 750.0000 mg | ORAL_TABLET | Freq: Four times a day (QID) | ORAL | 6 refills | Status: DC | PRN

## 2023-07-16 MED ORDER — TRAMADOL HCL 50 MG PO TABS: 50.0000 mg | ORAL_TABLET | Freq: Four times a day (QID) | ORAL | 3 refills | Status: DC | PRN

## 2023-07-16 MED ORDER — GADOBENATE DIMEGLUMINE 529 MG/ML IV SOLN
15.0000 mL | Freq: Once | INTRAVENOUS | Status: AC | PRN
  Administered 2023-07-16: 15 mL via INTRAVENOUS

## 2023-07-16 MED ORDER — METHOCARBAMOL 750 MG PO TABS: 750.0000 mg | ORAL_TABLET | Freq: Four times a day (QID) | ORAL | 0 refills | Status: DC | PRN

## 2023-07-16 NOTE — Telephone Encounter (Signed)
Taking that much tramadol is not a great idea, I gave it to her temporarily but it appears she is using all 30 in a month. I think we should start another medication to decrease her tramadol use. Tomorrow after the MRI ask her to get 2 copies of the MRI  on disk  so we can have a copy to send to Dr. Tempie Donning and she can have an additional copy to take with her. If she doesn't mind if she is close ([facility]?) maybe she can drop off one of the CDs and I can refer to Dr. Tempie Donning tomorrow to get her in asap.  I'll refill the tramadol but I also think we should get her on Tegretol or Trileptal (Carbamazepine and oxcarbazepine) which are first line medications for her TGN. Ask her about ever taking them, I don;t see them on her past medication list thanks.

## 2023-07-16 NOTE — Addendum Note (Signed)
Addended by: Bertram Savin on: 07/16/2023 03:46 PM   Modules accepted: Orders

## 2023-07-16 NOTE — Telephone Encounter (Signed)
I will prescribe the pain medication until we can get her to [facility] and she can get a procedure completed. thanks

## 2023-07-16 NOTE — Telephone Encounter (Signed)
Per epic adherence report, last filled:   Refill sent to pharmacy.

## 2023-07-16 NOTE — Telephone Encounter (Signed)
LMVM for pt to return call when she picks up.

## 2023-07-16 NOTE — Telephone Encounter (Addendum)
I called pt.  She had her MRI, and has left.  She will get a copy to take with her but the other I will let Stanton Kidney know so we can send to him with referral.  She has not taken  carbamazepine or oxcarbamazepine..  She took topamax and could not tolerate.  She says that the gabapentin is ok but makes her stupid.  The gabapentin works for electric shock pain, but the constant residual pain in ear/jaw is what she takes the tramadol for .  I did relay it was rx for a short period of time. I told her I would mychart with names of other medications.  Referral to be made when MRI read.

## 2023-07-21 ENCOUNTER — Other Ambulatory Visit: Payer: Self-pay | Admitting: Neurology

## 2023-07-21 DIAGNOSIS — G500 Trigeminal neuralgia: Secondary | ICD-10-CM

## 2023-07-29 ENCOUNTER — Encounter: Payer: Self-pay | Admitting: Neurology

## 2023-07-30 ENCOUNTER — Telehealth: Payer: Self-pay | Admitting: Neurology

## 2023-07-30 NOTE — Telephone Encounter (Signed)
Referral  for neurosurgery fax to [facility]. Phone: [phone], Fax: [phone] 

## 2023-08-06 NOTE — Progress Notes (Unsigned)
[facility] [address] [place] [address] Phone: [phone] Subjective:    I'm seeing this patient by the request  of: Lucia Gaskins MD  CC: Neck pain  PYP:[id number]  Shari Booth is a 52 y.o. female coming in with complaint of cervical myofacial pain syndrome  Reviewing patient's outside records have had trigeminal neuralgia for quite some time.  Unfortunately this causes her to have chronic headaches, chronic ear pain, Reviewed patient's laboratory workup has shown some mild hyponatremia over the years as well as hypocalcemia.   Medications patient has taken include Pristiq, gabapentin, methocarbamol, tramadol as well as Botox injections.  Past Medical History:  Diagnosis Date   Anxiety    Asthma    Hx with pregnancy only - No inhaler- no current problems   Bell's palsy    at age 14 & age 66, no current problems   Common migraine with intractable migraine 07/29/2016   Depression    Fibromyalgia    IBS (irritable bowel syndrome)    diet controlled   Neuromuscular disorder (HCC)    trigeminal nuralga - not currently active   Past Surgical History:  Procedure Laterality Date   ABDOMINAL HYSTERECTOMY  2016   ABLATION     BREAST REDUCTION SURGERY Bilateral 08/12/2018   Procedure: BILATERAL MAMMARY REDUCTION  (BREAST);  Surgeon: Peggye Form, DO;  Location: [facility];  Service: Plastics;  Laterality: Bilateral;   ceserean section x 3     COLONOSCOPY     CYSTOSCOPY N/A 06/12/2015   Procedure: CYSTOSCOPY;  Surgeon: Noland Fordyce, MD;  Location: [facility];  Service: Gynecology;  Laterality: N/A;   DILATION AND CURETTAGE OF UTERUS     LAPAROSCOPIC LYSIS OF ADHESIONS  01/12/2018   Procedure: LAPAROSCOPIC LYSIS OF ADHESIONS;  Surgeon: Noland Fordyce, MD;  Location: [facility];  Service: Gynecology;;   LAPAROSCOPY N/A 01/12/2018   Procedure: Diagnostic Laparoscopy;  Surgeon: Noland Fordyce, MD;  Location: [facility];  Service:  Gynecology;  Laterality: N/A;   REDUCTION MAMMAPLASTY Bilateral    ROBOTIC ASSISTED LAPAROSCOPIC LYSIS OF ADHESION  06/12/2015   Procedure: ROBOTIC ASSISTED LAPAROSCOPIC LYSIS OF ADHESION;  Surgeon: Noland Fordyce, MD;  Location: [facility];  Service: Gynecology;;   WISDOM TOOTH EXTRACTION     Social History   Socioeconomic History   Marital status: Married    Spouse name: Not on file   Number of children: 4   Years of education: Master's   Highest education level: Not on file  Occupational History   Occupation: Geophysicist/field seismologist and Language  Tobacco Use   Smoking status: Former    Current packs/day: 0.00    Average packs/day: 0.3 packs/day for 20.0 years (5.0 ttl pk-yrs)    Types: Cigarettes    Start date: 08/26/1996    Quit date: 08/26/2016    Years since quitting: 6.9   Smokeless tobacco: Never  Vaping Use   Vaping status: Never Used  Substance and Sexual Activity   Alcohol use: Yes    Comment: occ   Drug use: No   Sexual activity: Yes    Birth control/protection: Surgical  Other Topics Concern   Not on file  Social History Narrative   Lives at home w/ her husband and children   Right-handed   Caffeine: 1-2 cups per day   Social Determinants of Health   Financial Resource Strain: Not on file  Food Insecurity: Low Risk  (04/30/2023)   Received from [facility]  Hunger Vital Sign    Worried About Running Out of Food in the Last Year: Never true    Ran Out of Food in the Last Year: Never true  Transportation Needs: No Transportation Needs (04/30/2023)   Received from [facility]    In the past 12 months, has lack of reliable transportation kept you from medical appointments, meetings, work or from getting things needed for daily living? : No  Physical Activity: Not on file  Stress: Not on file  Social Connections: Unknown (12/16/2022)   Received from [facility]   Social Network    Social Network: Not on file   Allergies   Allergen Reactions   Topamax [Topiramate] Other (See Comments)    Tingling   Zonegran [Zonisamide] Itching   Family History  Problem Relation Age of Onset   High blood pressure Mother    Migraines Mother    High Cholesterol Father    Aortic stenosis Father    Arrhythmia Father        Pacemaker   Breast cancer Maternal Grandmother    Breast cancer Maternal Aunt    Lung cancer Maternal Aunt    Mental retardation Daughter        Current Outpatient Medications (Analgesics):    Rimegepant Sulfate (NURTEC) 75 MG TBDP, Take 75 mg by mouth daily as needed. For migraines. Take as close to onset of migraine as possible. One daily maximum.   traMADol (ULTRAM) 50 MG tablet, Take 1 tablet (50 mg total) by mouth every 6 (six) hours as needed.   Current Outpatient Medications (Other):    botulinum toxin Type A (BOTOX) 200 units injection, Inject 155 Units into the muscle every 3 (three) months.   clonazePAM (KLONOPIN) 0.5 MG tablet, Take 0.25 mg by mouth daily as needed for anxiety.    desvenlafaxine (PRISTIQ) 100 MG 24 hr tablet, Take 100 mg by mouth daily.   gabapentin (NEURONTIN) 300 MG capsule, Take 1 capsule (300 mg total) by mouth 3 (three) times daily.   methocarbamol (ROBAXIN) 750 MG tablet, Take 1 tablet (750 mg total) by mouth every 6 (six) hours as needed for muscle spasms.   Multiple Vitamin (MULTIVITAMIN WITH MINERALS) TABS tablet, Take 1 tablet by mouth daily.   WEGOVY 1.7 MG/0.75ML SOAJ,    Reviewed prior external information including notes and imaging from  primary care provider As well as notes that were available from care everywhere and other healthcare systems.  Past medical history, social, surgical and family history all reviewed in electronic medical record.  No pertanent information unless stated regarding to the chief complaint.   Review of Systems:  No headache, visual changes, nausea, vomiting, diarrhea, constipation, dizziness, abdominal pain, skin rash,  fevers, chills, night sweats, weight loss, swollen lymph nodes, body aches, joint swelling, chest pain, shortness of breath, mood changes. POSITIVE muscle aches  Objective  There were no vitals taken for this visit.   General: No apparent distress alert and oriented x3 mood and affect normal, dressed appropriately.  HEENT: Pupils equal, extraocular movements intact  Respiratory: Patient's speak in full sentences and does not appear short of breath  Cardiovascular: No lower extremity edema, non tender, no erythema  Neck exam shows    Impression and Recommendations:

## 2023-08-06 NOTE — Telephone Encounter (Signed)
Pt asking if increasing the Gabapentin is what's needed. Want to see if can get an appt,  can VV or OV would be fine. Would like a call back.

## 2023-08-06 NOTE — Telephone Encounter (Signed)
Either is fine

## 2023-08-07 ENCOUNTER — Ambulatory Visit: Payer: 59 | Admitting: Family Medicine

## 2023-08-10 ENCOUNTER — Encounter: Payer: Self-pay | Admitting: Neurology

## 2023-08-11 ENCOUNTER — Encounter: Payer: Self-pay | Admitting: Neurology

## 2023-08-11 ENCOUNTER — Telehealth: Payer: 59 | Admitting: Adult Health

## 2023-08-11 DIAGNOSIS — G500 Trigeminal neuralgia: Secondary | ICD-10-CM

## 2023-08-11 MED ORDER — GABAPENTIN 400 MG PO CAPS: 400.0000 mg | ORAL_CAPSULE | Freq: Three times a day (TID) | ORAL | 3 refills | Status: DC

## 2023-08-11 NOTE — Patient Instructions (Signed)
Your Plan:  Increase Gabapentin to 400 mg three times a day If your symptoms worsen or you develop new symptoms please let us know.    Thank you for coming to see [place] at [facility]. I hope we have been able to provide you high quality care today.  You may receive a patient satisfaction survey over the next few weeks. We would appreciate your feedback and comments so that we may continue to improve ourselves and the health of our patients.

## 2023-08-11 NOTE — Progress Notes (Signed)
PATIENT: Shari Booth DOB: June 30, 1971  REASON FOR VISIT: follow up HISTORY FROM: patient  Virtual Visit via Video Note  I connected with Shari Booth on 08/11/23 at  1:30 PM EST by a video enabled telemedicine application located remotely at [facility] and verified that I am speaking with the correct person using two identifiers who was located at their own home.   I discussed the limitations of evaluation and management by telemedicine and the availability of in person appointments. The patient expressed understanding and agreed to proceed.   PATIENT: Shari Booth DOB: [birth year]  REASON FOR VISIT: follow up HISTORY FROM: patient  HISTORY OF PRESENT ILLNESS: Today 08/11/23:  Shari Booth is a 52 y.o. female with a history of Trigeminal neuralgia . Returns today to discuss medication.  She is currently taking gabapentin 300 mg 3 times a day.  She states that this was working well for her but she is now having some breakthrough symptoms.  She states that gabapentin does make her feel "stupid."  She states that she has some word finding issues while on gabapentin.  She states that she is currently not working so is not affecting her job.  She is amenable to trying to increase gabapentin versus trying a new medication.  She also reports that she has muscle pain all over.  Has been taking Robaxin and tramadol.  She states that she was on 100 mg of tramadol but the last prescription was for 50 mg.  She states that she is going to run out of medicine before it is due.  She returns today for an evaluation.   REVIEW OF SYSTEMS: Out of a complete 14 system review of symptoms, the patient complains only of the following symptoms, and all other reviewed systems are negative.  ALLERGIES: Allergies  Allergen Reactions   Topamax [Topiramate] Other (See Comments)    Tingling   Zonegran [Zonisamide] Itching    HOME MEDICATIONS: Outpatient Medications  Prior to Visit  Medication Sig Dispense Refill   botulinum toxin Type A (BOTOX) 200 units injection Inject 155 Units into the muscle every 3 (three) months. 1 each 3   clonazePAM (KLONOPIN) 0.5 MG tablet Take 0.25 mg by mouth daily as needed for anxiety.      desvenlafaxine (PRISTIQ) 100 MG 24 hr tablet Take 100 mg by mouth daily.     gabapentin (NEURONTIN) 300 MG capsule Take 1 capsule (300 mg total) by mouth 3 (three) times daily. 90 capsule 11   methocarbamol (ROBAXIN) 750 MG tablet Take 1 tablet (750 mg total) by mouth every 6 (six) hours as needed for muscle spasms. 120 tablet 6   Multiple Vitamin (MULTIVITAMIN WITH MINERALS) TABS tablet Take 1 tablet by mouth daily.     Rimegepant Sulfate (NURTEC) 75 MG TBDP Take 75 mg by mouth daily as needed. For migraines. Take as close to onset of migraine as possible. One daily maximum. 8 tablet 5   traMADol (ULTRAM) 50 MG tablet Take 1 tablet (50 mg total) by mouth every 6 (six) hours as needed. 60 tablet 3   WEGOVY 1.7 MG/0.75ML SOAJ      No facility-administered medications prior to visit.    PAST MEDICAL HISTORY: Past Medical History:  Diagnosis Date   Anxiety    Asthma    Hx with pregnancy only - No inhaler- no current problems   Bell's palsy    at age 4 & age 35, no current problems  Common migraine with intractable migraine 07/29/2016   Depression    Fibromyalgia    IBS (irritable bowel syndrome)    diet controlled   Neuromuscular disorder (HCC)    trigeminal nuralga - not currently active    PAST SURGICAL HISTORY: Past Surgical History:  Procedure Laterality Date   ABDOMINAL HYSTERECTOMY  2016   ABLATION     BREAST REDUCTION SURGERY Bilateral 08/12/2018   Procedure: BILATERAL MAMMARY REDUCTION  (BREAST);  Surgeon: Peggye Form, DO;  Location: [facility];  Service: Plastics;  Laterality: Bilateral;   ceserean section x 3     COLONOSCOPY     CYSTOSCOPY N/A 06/12/2015   Procedure: CYSTOSCOPY;  Surgeon:  Noland Fordyce, MD;  Location: [facility];  Service: Gynecology;  Laterality: N/A;   DILATION AND CURETTAGE OF UTERUS     LAPAROSCOPIC LYSIS OF ADHESIONS  01/12/2018   Procedure: LAPAROSCOPIC LYSIS OF ADHESIONS;  Surgeon: Noland Fordyce, MD;  Location: [facility];  Service: Gynecology;;   LAPAROSCOPY N/A 01/12/2018   Procedure: Diagnostic Laparoscopy;  Surgeon: Noland Fordyce, MD;  Location: [facility];  Service: Gynecology;  Laterality: N/A;   REDUCTION MAMMAPLASTY Bilateral    ROBOTIC ASSISTED LAPAROSCOPIC LYSIS OF ADHESION  06/12/2015   Procedure: ROBOTIC ASSISTED LAPAROSCOPIC LYSIS OF ADHESION;  Surgeon: Noland Fordyce, MD;  Location: [facility];  Service: Gynecology;;   WISDOM TOOTH EXTRACTION      FAMILY HISTORY: Family History  Problem Relation Age of Onset   High blood pressure Mother    Migraines Mother    High Cholesterol Father    Aortic stenosis Father    Arrhythmia Father        Pacemaker   Breast cancer Maternal Grandmother    Breast cancer Maternal Aunt    Lung cancer Maternal Aunt    Mental retardation Daughter     SOCIAL HISTORY: Social History   Socioeconomic History   Marital status: Married    Spouse name: Not on file   Number of children: 4   Years of education: Master's   Highest education level: Not on file  Occupational History   Occupation: Geophysicist/field seismologist and Language  Tobacco Use   Smoking status: Former    Current packs/day: 0.00    Average packs/day: 0.3 packs/day for 20.0 years (5.0 ttl pk-yrs)    Types: Cigarettes    Start date: 08/26/1996    Quit date: 08/26/2016    Years since quitting: 6.9   Smokeless tobacco: Never  Vaping Use   Vaping status: Never Used  Substance and Sexual Activity   Alcohol use: Yes    Comment: occ   Drug use: No   Sexual activity: Yes    Birth control/protection: Surgical  Other Topics Concern   Not on file  Social History Narrative   Lives at home w/ her husband and children   Right-handed   Caffeine: 1-2 cups per  day   Social Drivers of Health   Financial Resource Strain: Not on file  Food Insecurity: Low Risk  (04/30/2023)   Received from [facility]   Hunger Vital Sign    Worried About Running Out of Food in the Last Year: Never true    Ran Out of Food in the Last Year: Never true  Transportation Needs: No Transportation Needs (04/30/2023)   Received from [facility]    In the past 12 months, has lack of reliable transportation kept you from medical appointments, meetings, work or from getting things  needed for daily living? : No  Physical Activity: Not on file  Stress: Not on file  Social Connections: Unknown (12/16/2022)   Received from [facility], [facility]   Social Network    Social Network: Not on file  Intimate Partner Violence: Unknown (12/16/2022)   Received from [facility], [facility]   HITS    Physically Hurt: Not on file    Insult or Talk Down To: Not on file    Threaten Physical Harm: Not on file    Scream or Curse: Not on file      PHYSICAL EXAM Generalized: Well developed, in no acute distress   Neurological examination  Mentation: Alert oriented to time, place, history taking. Follows all commands speech and language fluent Cranial nerve II-XII:Extraocular movements were full. Facial symmetry noted.   DIAGNOSTIC DATA (LABS, IMAGING, TESTING) - I reviewed patient records, labs, notes, testing and imaging myself where available.  Lab Results  Component Value Date   WBC 4.9 11/12/2022   HGB 13.8 11/12/2022   HCT 41.3 11/12/2022   MCV 87.3 11/12/2022   PLT 228 11/12/2022      Component Value Date/Time   NA 132 (L) 11/12/2022 1530   K 3.7 11/12/2022 1530   CL 103 11/12/2022 1530   CO2 24 11/12/2022 1530   GLUCOSE 83 11/12/2022 1530   BUN 10 11/12/2022 1530   CREATININE 0.99 11/12/2022 1530   CALCIUM 8.1 (L) 11/12/2022 1530   PROT 6.8 11/12/2022 1530   ALBUMIN 3.6 11/12/2022 1530   AST 16 11/12/2022 1530   ALT 12 11/12/2022  1530   ALKPHOS 62 11/12/2022 1530   BILITOT 0.4 11/12/2022 1530   GFRNONAA >60 11/12/2022 1530   GFRAA >60 01/09/2018 0825      ASSESSMENT AND PLAN 52 y.o. year old female  has a past medical history of Anxiety, Asthma, Bell's palsy, Common migraine with intractable migraine (07/29/2016), Depression, Fibromyalgia, IBS (irritable bowel syndrome), and Neuromuscular disorder (HCC). here with:  1.  Trigeminal neuralgia  -Increase gabapentin to 400 mg 3 times a day.  Advised that if she was unable to tolerate the increase we could consider a different medication such as Trileptal or Tegretol. -Not sure why tramadol was decreased to 50 mg tablets however I will discuss with Dr. Lucia Gaskins.  If she is amenable we can go back to the 100 mg tablets -Keep appointment in February with Dr. Lucia Gaskins.      I spent 15 minutes with the patient. 50% of this time was spent   Butch Penny, MSN, NP-C 08/11/2023, 1:30 PM [facility] [address], [address] [place], [place] [address] [phone]

## 2023-08-13 NOTE — Telephone Encounter (Signed)
Referral form completed, office notes, insurance card copy, and imaging results printed.

## 2023-08-13 NOTE — Telephone Encounter (Signed)
Referral faxed to [facility]. Received a receipt of confirmation.

## 2023-08-18 LAB — COMPREHENSIVE METABOLIC PANEL WITH GFR: EGFR: 98

## 2023-08-18 LAB — TSH: TSH: 2.08

## 2023-08-26 ENCOUNTER — Other Ambulatory Visit: Payer: Self-pay | Admitting: Neurology

## 2023-09-03 ENCOUNTER — Ambulatory Visit: Payer: 59 | Admitting: Family Medicine

## 2023-09-08 ENCOUNTER — Telehealth: Payer: Self-pay | Admitting: Neurology

## 2023-09-08 NOTE — Telephone Encounter (Signed)
 Submitted auth renewal via [employer] portal, received instant approval. Pt will continue to fill through [facility].  Auth#: [id number] (09/08/23-09/07/24)

## 2023-09-08 NOTE — Telephone Encounter (Signed)
 Delivery pending pt consent.

## 2023-09-08 NOTE — Telephone Encounter (Signed)
 Called [facility] and relayed dx code, no additional information was needed at this time.

## 2023-09-08 NOTE — Telephone Encounter (Signed)
 [facility] Ethelene Browns) calling for a ICD code for Botox. Contact info: [phone]

## 2023-09-15 ENCOUNTER — Telehealth: Payer: Self-pay | Admitting: Adult Health

## 2023-09-15 NOTE — Telephone Encounter (Signed)
[facility] called to schedule delivery of Botox . Delivery date Wed 01-22nd-2025, sdv 20  units 1 package 90 day supply address verified along with hours, signature required

## 2023-09-22 ENCOUNTER — Ambulatory Visit: Payer: 59 | Admitting: Family Medicine

## 2023-09-29 ENCOUNTER — Ambulatory Visit (INDEPENDENT_AMBULATORY_CARE_PROVIDER_SITE_OTHER): Payer: 59 | Admitting: Neurology

## 2023-09-29 DIAGNOSIS — G43711 Chronic migraine without aura, intractable, with status migrainosus: Secondary | ICD-10-CM | POA: Diagnosis not present

## 2023-09-29 MED ORDER — ONABOTULINUMTOXINA 200 UNITS IJ SOLR
155.0000 [IU] | Freq: Once | INTRAMUSCULAR | Status: AC
  Administered 2023-09-29: 155 [IU] via INTRAMUSCULAR

## 2023-09-29 NOTE — Progress Notes (Signed)
Consent Form Botulism Toxin Injection For Chronic Migraine  09/29/2023: Patient feels that her migraines cause her jaw to ache in her jaw aching also can cause her migraines to worsen it is a trigger for migraines included 5 units in each masseter to see if that helps with migraine severity. Also placed the botox higher in the forehead to avoid her feeling like she can't see because her forehead droops into hr eye. .  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 Sol Blazing he is treating her pain as well changed to lyrica and not on gabapentin and not tramadol.  Ask next time!   ADDENDUM 08/03/2023:  The primary treatment for trigeminal neuralgia is medication, typically anticonvulsant drugs like carbamazepine (Tegretol) or oxcarbazepine (Trileptal), which are often the first line of treatment and can provide significant pain relief for most patients; if medications are not effective enough, surgical options like microvascular decompression may be considered depending on the severity and cause of the pain.  Key points about trigeminal neuralgia treatment:  Medications: First-line therapy: Carbamazepine and oxcarbazepine are the preferred initial medications, often effective in managing pain for a majority of patients.   Other anticonvulsants: Gabapentin, lamotrigine, topiramate, phenytoin can also be used depending on individual needs.   Important consideration: Medications may need to be adjusted over time as effectiveness can decrease.  Surgical options (when medication is not sufficient):  Microvascular decompression: Considered the gold standard surgical procedure, where a blood vessel compressing the trigeminal nerve is gently moved away.  Percutaneous rhizotomy: A less invasive procedure that destroys a part of the trigeminal nerve to block pain signals.  Balloon compression: A minimally invasive procedure where a  balloon is used to compress the trigeminal nerve.  Stereotactic radiosurgery: Uses radiation beams to target the trigeminal nerve.  Other potential treatments: Nerve blocks: Injections of anesthetic near the affected nerve for temporary pain relief.  Botulinum toxin injections: May be used in some cases to provide temporary pain relief.  Physical therapy: Techniques like massage or muscle relaxation may help manage pain    07/07/2023: stable. She feels her clenching causes her migraines, +5 masseters each also place the botox high in the forehead.  Patient still having problems wit trigeminal nerve. MRI of the brain with attention to the brain stem did not show any lesions to suggest MS but also was not able to visualize any vascular loops on the right will order MRI Face/Trigeminal protocol. Also her neck still hurts, MRI cervical spine without significant findings.  PLAN:  Dr. Terrilee Files for myofascial neck pain 2. MRI specific protocol right trigeminal neuralgia(MR Face/trigeminal look at the right TMJ for anything there) 3. Dr. Tempie Donning at [facility] for Trigeminal Neuralgia likely vascular loop after MRI is back can send MRI on disk up with the referral hopefully for microvascular decompression  No orders of the defined types were placed in this encounter.    04/03/2923: stable  12/24/2022: still great results, stable, doing well  03/25/2022: doing great, >> 70% improvement in migraine freq and severity 10/30/2021: She has stopped taking the Ajovy and still doing well. We can push out botox to 16-20 weeks and see how she does.  try nurtec.    07/04/2021: Doing exceptionally well! Has 4 kids. First daughter off to [place]. Twins are 41. she has been able to go 20 weeks without a migraine. Tremendous improvement. She has done tremendously well with NO MIGRAINES  since last botox injection, she almost doesn't remember what it feels like to have headaches. Baseline headache frequency is daily  headaches with 15 migrainous.  100% reduction in frequency and severity of headaches and migraines.   Consent Form Botulism Toxin Injection For Chronic Migraine    Reviewed orally with patient, additionally signature is on file:  Botulism toxin has been approved by the Federal drug administration for treatment of chronic migraine. Botulism toxin does not cure chronic migraine and it may not be effective in some patients.  The administration of botulism toxin is accomplished by injecting a small amount of toxin into the muscles of the neck and head. Dosage must be titrated for each individual. Any benefits resulting from botulism toxin tend to wear off after 3 months with a repeat injection required if benefit is to be maintained. Injections are usually done every 3-4 months with maximum effect peak achieved by about 2 or 3 weeks. Botulism toxin is expensive and you should be sure of what costs you will incur resulting from the injection.  The side effects of botulism toxin use for chronic migraine may include:   -Transient, and usually mild, facial weakness with facial injections  -Transient, and usually mild, head or neck weakness with head/neck injections  -Reduction or loss of forehead facial animation due to forehead muscle weakness  -Eyelid drooping  -Dry eye  -Pain at the site of injection or bruising at the site of injection  -Double vision  -Potential unknown long term risks  Contraindications: You should not have Botox if you are pregnant, nursing, allergic to albumin, have an infection, skin condition, or muscle weakness at the site of the injection, or have myasthenia gravis, Lambert-Eaton syndrome, or ALS.  It is also possible that as with any injection, there may be an allergic reaction or no effect from the medication. Reduced effectiveness after repeated injections is sometimes seen and rarely infection at the injection site may occur. All care will be taken to prevent these  side effects. If therapy is given over a long time, atrophy and wasting in the muscle injected may occur. Occasionally the patient's become refractory to treatment because they develop antibodies to the toxin. In this event, therapy needs to be modified.  I have read the above information and consent to the administration of botulism toxin.    BOTOX PROCEDURE NOTE FOR MIGRAINE HEADACHE    Contraindications and precautions discussed with patient(above). Aseptic procedure was observed and patient tolerated procedure. Procedure performed by Dr. Artemio Aly  The condition has existed for more than 6 months, and pt does not have a diagnosis of ALS, Myasthenia Gravis or Lambert-Eaton Syndrome.  Risks and benefits of injections discussed and pt agrees to proceed with the procedure.  Written consent obtained  These injections are medically necessary. Pt  receives good benefits from these injections. These injections do not cause sedations or hallucinations which the oral therapies may cause.  Description of procedure:  The patient was placed in a sitting position. The standard protocol was used for Botox as follows, with 5 units of Botox injected at each site:   -Procerus muscle, midline injection  -Corrugator muscle, bilateral injection  -Frontalis muscle, bilateral injection, with 2 sites each side, medial injection was performed in the upper one third of the frontalis muscle, in the region vertical from the medial inferior edge of the superior orbital rim. The lateral injection was again in the upper one third of the forehead vertically above the lateral limbus  of the cornea, 1.5 cm lateral to the medial injection site.  -Temporalis muscle injection, 4 sites, bilaterally. The first injection was 3 cm above the tragus of the ear, second injection site was 1.5 cm to 3 cm up from the first injection site in line with the tragus of the ear. The third injection site was 1.5-3 cm forward between the  first 2 injection sites. The fourth injection site was 1.5 cm posterior to the second injection site.   -Occipitalis muscle injection, 3 sites, bilaterally. The first injection was done one half way between the occipital protuberance and the tip of the mastoid process behind the ear. The second injection site was done lateral and superior to the first, 1 fingerbreadth from the first injection. The third injection site was 1 fingerbreadth superiorly and medially from the first injection site.  -Cervical paraspinal muscle injection, 2 sites, bilateral knee first injection site was 1 cm from the midline of the cervical spine, 3 cm inferior to the lower border of the occipital protuberance. The second injection site was 1.5 cm superiorly and laterally to the first injection site.  -Trapezius muscle injection was performed at 3 sites, bilaterally. The first injection site was in the upper trapezius muscle halfway between the inflection point of the neck, and the acromion. The second injection site was one half way between the acromion and the first injection site. The third injection was done between the first injection site and the inflection point of the neck.   Will return for repeat injection in 3 months.   155 units of Botox was used, 45u Botox not injected was wasted. The patient tolerated the procedure well, there were no complications of the above procedure.

## 2023-09-29 NOTE — Progress Notes (Signed)
Botox consent signed  Botox- 200 units x 1 vial Lot: [id number] Expiration: 11/2025 NDC: 5409-8119-14  Bacteriostatic 0.9% Sodium Chloride- 4 mL  Lot: [id number] Expiration: 06/26/2024 NDC: 2130-8657-84  Dx: O96.295 S/P  Witnessed by Truitt Leep RN

## 2023-10-15 ENCOUNTER — Encounter: Payer: Self-pay | Admitting: Neurology

## 2023-10-16 ENCOUNTER — Telehealth: Payer: Self-pay | Admitting: Neurology

## 2023-10-16 NOTE — Telephone Encounter (Signed)
LVM and sent mychart msg asking pt to call back to reschedule appointment with Dr. Lucia Gaskins

## 2023-10-16 NOTE — Telephone Encounter (Signed)
She wants to push out her next follow up appointment 2-3 months can you reschedule her? She could see an NP as well thanks

## 2023-10-20 ENCOUNTER — Ambulatory Visit: Payer: 59 | Admitting: Neurology

## 2023-11-08 ENCOUNTER — Encounter: Payer: Self-pay | Admitting: Neurology

## 2023-11-11 DIAGNOSIS — M47812 Spondylosis without myelopathy or radiculopathy, cervical region: Secondary | ICD-10-CM | POA: Insufficient documentation

## 2023-11-11 DIAGNOSIS — G501 Atypical facial pain: Secondary | ICD-10-CM | POA: Insufficient documentation

## 2023-11-12 ENCOUNTER — Telehealth: Payer: Self-pay | Admitting: Neurology

## 2023-11-12 NOTE — Telephone Encounter (Signed)
 Dr Lucia Gaskins sent message to scheduling team.

## 2023-11-12 NOTE — Telephone Encounter (Signed)
 Appt cancelled. LVM asking pt to call back to reschedule 12/02/23 appt

## 2023-11-12 NOTE — Telephone Encounter (Signed)
 Please call patient and cancel her April 8th appointment and she can call us with a follow up date or we can schedule 4-6 out thanks

## 2023-12-02 ENCOUNTER — Ambulatory Visit: Payer: 59 | Admitting: Neurology

## 2023-12-15 ENCOUNTER — Telehealth: Payer: Self-pay | Admitting: Adult Health

## 2023-12-15 NOTE — Telephone Encounter (Signed)
 [facility]/tamara schedule delivery of Botox  on 12/16/23

## 2023-12-22 ENCOUNTER — Telehealth: Payer: Self-pay | Admitting: Neurology

## 2023-12-22 ENCOUNTER — Telehealth: Payer: Self-pay | Admitting: Adult Health

## 2023-12-22 ENCOUNTER — Ambulatory Visit (INDEPENDENT_AMBULATORY_CARE_PROVIDER_SITE_OTHER): Payer: 59 | Admitting: Neurology

## 2023-12-22 DIAGNOSIS — G43711 Chronic migraine without aura, intractable, with status migrainosus: Secondary | ICD-10-CM

## 2023-12-22 MED ORDER — ONABOTULINUMTOXINA 200 UNITS IJ SOLR
155.0000 [IU] | Freq: Once | INTRAMUSCULAR | Status: AC
  Administered 2023-12-22: 155 [IU] via INTRAMUSCULAR

## 2023-12-22 NOTE — Progress Notes (Signed)
 error

## 2023-12-22 NOTE — Progress Notes (Signed)
 Consent Form Botulism Toxin Injection For Chronic Migraine  12/22/2023: Patient feels that her migraines cause her jaw to ache in her jaw aching also can cause her migraines to worsen it is a trigger for migraines included 5 units in each masseter to see if that helps with migraine severity. Also placed the botox  higher in the forehead to avoid her feeling like she can't see because her forehead droops into hr eye. She may need wegovy  reordered, we can write her a script but we cannot fill out PA. She is getting a sphenopalatine ganglion procedure.   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 Murriel Arrow he is treating her pain as well changed to lyrica and not on gabapentin  and not tramadol .  Ask next time!   09/29/2023: Patient feels that her migraines cause her jaw to ache in her jaw aching also can cause her migraines to worsen it is a trigger for migraines included 5 units in each masseter to see if that helps with migraine severity. Also placed the botox  higher in the forehead to avoid her feeling like she can't see because her forehead droops into hr eye. .    ADDENDUM 08/03/2023:  The primary treatment for trigeminal neuralgia is medication, typically anticonvulsant drugs like carbamazepine (Tegretol) or oxcarbazepine (Trileptal), which are often the first line of treatment and can provide significant pain relief for most patients; if medications are not effective enough, surgical options like microvascular decompression may be considered depending on the severity and cause of the pain.  Key points about trigeminal neuralgia treatment:  Medications: First-line therapy: Carbamazepine and oxcarbazepine are the preferred initial medications, often effective in managing pain for a majority of patients.   Other anticonvulsants: Gabapentin , lamotrigine, topiramate , phenytoin can also be used depending on individual  needs.   Important consideration: Medications may need to be adjusted over time as effectiveness can decrease.  Surgical options (when medication is not sufficient):  Microvascular decompression: Considered the gold standard surgical procedure, where a blood vessel compressing the trigeminal nerve is gently moved away.  Percutaneous rhizotomy: A less invasive procedure that destroys a part of the trigeminal nerve to block pain signals.  Balloon compression: A minimally invasive procedure where a balloon is used to compress the trigeminal nerve.  Stereotactic radiosurgery: Uses radiation beams to target the trigeminal nerve.  Other potential treatments: Nerve blocks: Injections of anesthetic near the affected nerve for temporary pain relief.  Botulinum toxin injections: May be used in some cases to provide temporary pain relief.  Physical therapy: Techniques like massage or muscle relaxation may help manage pain    07/07/2023: stable. She feels her clenching causes her migraines, +5 masseters each also place the botox  high in the forehead.  Patient still having problems wit trigeminal nerve. MRI of the brain with attention to the brain stem did not show any lesions to suggest MS but also was not able to visualize any vascular loops on the right will order MRI Face/Trigeminal protocol. Also her neck still hurts, MRI cervical spine without significant findings.  PLAN:  Dr. Zach Smith for myofascial neck pain 2. MRI specific protocol right trigeminal neuralgia(MR Face/trigeminal look at the right TMJ for anything there) 3. Dr. Otha Blight at [facility] for Trigeminal Neuralgia likely vascular loop after MRI is back can send MRI on disk up with the referral hopefully for microvascular decompression  No orders of the defined types were placed in this  encounter.    04/03/2923: stable  12/24/2022: still great results, stable, doing well  03/25/2022: doing great, >> 70% improvement in migraine freq and  severity 10/30/2021: She has stopped taking the Ajovy  and still doing well. We can push out botox  to 16-20 weeks and see how she does.  try nurtec.    07/04/2021: Doing exceptionally well! Has 4 kids. First daughter off to [place]. Twins are 105. she has been able to go 20 weeks without a migraine. Tremendous improvement. She has done tremendously well with NO MIGRAINES since last botox  injection, she almost doesn't remember what it feels like to have headaches. Baseline headache frequency is daily headaches with 15 migrainous.  100% reduction in frequency and severity of headaches and migraines.   Consent Form Botulism Toxin Injection For Chronic Migraine    Reviewed orally with patient, additionally signature is on file:  Botulism toxin has been approved by the Federal drug administration for treatment of chronic migraine. Botulism toxin does not cure chronic migraine and it may not be effective in some patients.  The administration of botulism toxin is accomplished by injecting a small amount of toxin into the muscles of the neck and head. Dosage must be titrated for each individual. Any benefits resulting from botulism toxin tend to wear off after 3 months with a repeat injection required if benefit is to be maintained. Injections are usually done every 3-4 months with maximum effect peak achieved by about 2 or 3 weeks. Botulism toxin is expensive and you should be sure of what costs you will incur resulting from the injection.  The side effects of botulism toxin use for chronic migraine may include:   -Transient, and usually mild, facial weakness with facial injections  -Transient, and usually mild, head or neck weakness with head/neck injections  -Reduction or loss of forehead facial animation due to forehead muscle weakness  -Eyelid drooping  -Dry eye  -Pain at the site of injection or bruising at the site of injection  -Double vision  -Potential unknown long term  risks  Contraindications: You should not have Botox  if you are pregnant, nursing, allergic to albumin, have an infection, skin condition, or muscle weakness at the site of the injection, or have myasthenia gravis, Lambert-Eaton syndrome, or ALS.  It is also possible that as with any injection, there may be an allergic reaction or no effect from the medication. Reduced effectiveness after repeated injections is sometimes seen and rarely infection at the injection site may occur. All care will be taken to prevent these side effects. If therapy is given over a long time, atrophy and wasting in the muscle injected may occur. Occasionally the patient's become refractory to treatment because they develop antibodies to the toxin. In this event, therapy needs to be modified.  I have read the above information and consent to the administration of botulism toxin.    BOTOX  PROCEDURE NOTE FOR MIGRAINE HEADACHE    Contraindications and precautions discussed with patient(above). Aseptic procedure was observed and patient tolerated procedure. Procedure performed by Dr. Criselda Dolly  The condition has existed for more than 6 months, and pt does not have a diagnosis of ALS, Myasthenia Gravis or Lambert-Eaton Syndrome.  Risks and benefits of injections discussed and pt agrees to proceed with the procedure.  Written consent obtained  These injections are medically necessary. Pt  receives good benefits from these injections. These injections do not cause sedations or hallucinations which the oral therapies may cause.  Description of procedure:  The patient was placed in a sitting position. The standard protocol was used for Botox  as follows, with 5 units of Botox  injected at each site:   -Procerus muscle, midline injection  -Corrugator muscle, bilateral injection  -Frontalis muscle, bilateral injection, with 2 sites each side, medial injection was performed in the upper one third of the frontalis muscle, in  the region vertical from the medial inferior edge of the superior orbital rim. The lateral injection was again in the upper one third of the forehead vertically above the lateral limbus of the cornea, 1.5 cm lateral to the medial injection site.  -Temporalis muscle injection, 4 sites, bilaterally. The first injection was 3 cm above the tragus of the ear, second injection site was 1.5 cm to 3 cm up from the first injection site in line with the tragus of the ear. The third injection site was 1.5-3 cm forward between the first 2 injection sites. The fourth injection site was 1.5 cm posterior to the second injection site.   -Occipitalis muscle injection, 3 sites, bilaterally. The first injection was done one half way between the occipital protuberance and the tip of the mastoid process behind the ear. The second injection site was done lateral and superior to the first, 1 fingerbreadth from the first injection. The third injection site was 1 fingerbreadth superiorly and medially from the first injection site.  -Cervical paraspinal muscle injection, 2 sites, bilateral knee first injection site was 1 cm from the midline of the cervical spine, 3 cm inferior to the lower border of the occipital protuberance. The second injection site was 1.5 cm superiorly and laterally to the first injection site.  -Trapezius muscle injection was performed at 3 sites, bilaterally. The first injection site was in the upper trapezius muscle halfway between the inflection point of the neck, and the acromion. The second injection site was one half way between the acromion and the first injection site. The third injection was done between the first injection site and the inflection point of the neck.   Will return for repeat injection in 3 months.   155 units of Botox  was used, 45u Botox  not injected was wasted. The patient tolerated the procedure well, there were no complications of the above procedure.

## 2023-12-22 NOTE — Progress Notes (Signed)
 Botox - 200 units x 1 vial Lot: [id number] Expiration: 04/2026 NDC: 4098-1191-47  Bacteriostatic 0.9% Sodium Chloride - 4 mL  Lot: [id number] Expiration: 06/26/24 NDC: 1308-6578-46  Dx: N62.952 S/P  Witnessed by Inocencio Mania RN  Botox  consent signed

## 2023-12-22 NOTE — Telephone Encounter (Signed)
 LVM and sent mychart msg informing pt of need to reschedule 03/08/24 appt - MD out  If patient calls back to r/s you can offer an appointment at the end of August with Dr. Tresia Fruit

## 2023-12-22 NOTE — Telephone Encounter (Signed)
 Pt called to cancel appointment because was not feeling well, informed patient of show fee of $50. Patient decided to keep appointment

## 2024-02-03 ENCOUNTER — Encounter: Payer: Self-pay | Admitting: Neurology

## 2024-02-08 ENCOUNTER — Encounter (INDEPENDENT_AMBULATORY_CARE_PROVIDER_SITE_OTHER): Payer: Self-pay | Admitting: Neurology

## 2024-02-08 DIAGNOSIS — Z0289 Encounter for other administrative examinations: Secondary | ICD-10-CM

## 2024-02-08 DIAGNOSIS — G500 Trigeminal neuralgia: Secondary | ICD-10-CM

## 2024-02-10 ENCOUNTER — Other Ambulatory Visit: Payer: Self-pay | Admitting: Neurology

## 2024-02-10 DIAGNOSIS — G500 Trigeminal neuralgia: Secondary | ICD-10-CM

## 2024-02-10 NOTE — Telephone Encounter (Signed)
 Please see the MyChart message reply(ies) for my assessment and plan.    This patient gave consent for this Medical Advice Message and is aware that it may result in a bill to [employer], as well as the possibility of receiving a bill for a co-payment or deductible. They are an established patient, but are not seeking medical advice exclusively about a problem treated during an in person or video visit in the last seven days. I did not recommend an in person or video visit within seven days of my reply.    I spent a total of 6 minutes cumulative time within 7 days through [employer].  Anson Fret, MD

## 2024-02-11 ENCOUNTER — Telehealth: Payer: Self-pay | Admitting: Neurology

## 2024-02-11 NOTE — Telephone Encounter (Signed)
 Referral for neurology fax to [facility]. Phone: [phone], Fax: [phone].

## 2024-02-16 NOTE — Addendum Note (Signed)
 Addended by: HILLIARD HEATHER CROME on: 02/16/2024 10:52 AM   Modules accepted: Orders

## 2024-02-17 ENCOUNTER — Other Ambulatory Visit: Payer: Self-pay | Admitting: Neurology

## 2024-02-17 ENCOUNTER — Telehealth: Payer: Self-pay | Admitting: Neurology

## 2024-02-17 ENCOUNTER — Telehealth: Payer: Self-pay | Admitting: *Deleted

## 2024-02-17 MED ORDER — CYCLOBENZAPRINE HCL 5 MG PO TABS: 5.0000 mg | ORAL_TABLET | Freq: Three times a day (TID) | ORAL | 1 refills | Status: DC | PRN

## 2024-02-17 NOTE — Telephone Encounter (Signed)
 A request fax over on 02/17/2024 to [facility] requesting pt mri image to be mailed to [facility]

## 2024-02-17 NOTE — Telephone Encounter (Signed)
 Dr. Ines instructed to send MRI CD with referral. Discussed with Adrien, Medical Records. Will fax request to [facility] for copies of MRI CDs.

## 2024-02-17 NOTE — Telephone Encounter (Signed)
 Please see the MyChart message reply(ies) for my assessment and plan.    This patient gave consent for this Medical Advice Message and is aware that it may result in a bill to [employer], as well as the possibility of receiving a bill for a co-payment or deductible. They are an established patient, but are not seeking medical advice exclusively about a problem treated during an in person or video visit in the last seven days. I did not recommend an in person or video visit within seven days of my reply.    I spent a total of 11 minutes cumulative time within 7 days through [employer].  Onetha KATHEE Epp, MD

## 2024-03-01 NOTE — Telephone Encounter (Signed)
 [name] requested MRI, LVM for patient to call the office in regards to referral.

## 2024-03-01 NOTE — Telephone Encounter (Signed)
 Spoke with patient to pick up MRI CD for before going to appointment with Neurosurgery. Patient said pick up MRI CD this week.

## 2024-03-08 ENCOUNTER — Ambulatory Visit: Admitting: Neurology

## 2024-03-18 ENCOUNTER — Telehealth: Payer: Self-pay | Admitting: Neurology

## 2024-03-18 NOTE — Telephone Encounter (Signed)
 [place]berry @ [facility] called to confirm delivery of Botox  for 7-29 200 units, SDV, 90 day quantity of 1

## 2024-03-24 ENCOUNTER — Encounter: Payer: Self-pay | Admitting: Neurology

## 2024-03-24 DIAGNOSIS — G43109 Migraine with aura, not intractable, without status migrainosus: Secondary | ICD-10-CM | POA: Insufficient documentation

## 2024-03-29 NOTE — Telephone Encounter (Signed)
 Spoke with Megan NP and patient. Pt will see Harlene for Botox  tomorrow 8/5 at 3:15 pm check-in 3:00 pm.

## 2024-03-30 ENCOUNTER — Ambulatory Visit (INDEPENDENT_AMBULATORY_CARE_PROVIDER_SITE_OTHER): Admitting: Adult Health

## 2024-03-30 VITALS — BP 111/63 | HR 78

## 2024-03-30 DIAGNOSIS — G43711 Chronic migraine without aura, intractable, with status migrainosus: Secondary | ICD-10-CM

## 2024-03-30 MED ORDER — ONABOTULINUMTOXINA 200 UNITS IJ SOLR
155.0000 [IU] | Freq: Once | INTRAMUSCULAR | Status: AC
  Administered 2024-03-30: 165 [IU] via INTRAMUSCULAR

## 2024-03-30 NOTE — Progress Notes (Signed)
 Update 03/30/2024 Shari Booth: Patient returns for repeat Botox  injection.  Prior injection with Dr. Ines 12/22/2023. Reports continued benefit with Botox  in regards to migraine headaches, has not had a recent migraine headache.  She continues to struggle with trigeminal neuralgia, she reports recently seeing [facility] neurology who recommended returning back to this office to receive Botox  for her trigeminal neuralgia.  She was advised that I am not familiar with doing Botox  for trigeminal neuralgia and will only be able to follow migraine protocol with today's injections. She does continue to experience jaw pain which can trigger migraines and notes continued benefit with masseter injections. She also questions different medications to use for trigeminal neuralgia that will not interact with her current pain medications.  She does have a follow-up visit scheduled with Dr. Ines in October, she was advised to speak with check out to see if sooner visit in available for this can be further discussed. Will also send note today to Dr. Ines to review once she is back in office.  She tolerated procedure well today.  She will return in 3 months for repeat injection.        Consent Form Botulism Toxin Injection For Chronic Migraine    Reviewed orally with patient, additionally signature is on file:  Botulism toxin has been approved by the Federal drug administration for treatment of chronic migraine. Botulism toxin does not cure chronic migraine and it may not be effective in some patients.  The administration of botulism toxin is accomplished by injecting a small amount of toxin into the muscles of the neck and head. Dosage must be titrated for each individual. Any benefits resulting from botulism toxin tend to wear off after 3 months with a repeat injection required if benefit is to be maintained. Injections are usually done every 3-4 months with maximum effect peak achieved by about 2 or 3 weeks.  Botulism toxin is expensive and you should be sure of what costs you will incur resulting from the injection.  The side effects of botulism toxin use for chronic migraine may include:   -Transient, and usually mild, facial weakness with facial injections  -Transient, and usually mild, head or neck weakness with head/neck injections  -Reduction or loss of forehead facial animation due to forehead muscle weakness  -Eyelid drooping  -Dry eye  -Pain at the site of injection or bruising at the site of injection  -Double vision  -Potential unknown long term risks   Contraindications: You should not have Botox  if you are pregnant, nursing, allergic to albumin, have an infection, skin condition, or muscle weakness at the site of the injection, or have myasthenia gravis, Lambert-Eaton syndrome, or ALS.  It is also possible that as with any injection, there may be an allergic reaction or no effect from the medication. Reduced effectiveness after repeated injections is sometimes seen and rarely infection at the injection site may occur. All care will be taken to prevent these side effects. If therapy is given over a long time, atrophy and wasting in the muscle injected may occur. Occasionally the patient's become refractory to treatment because they develop antibodies to the toxin. In this event, therapy needs to be modified.  I have read the above information and consent to the administration of botulism toxin.    BOTOX  PROCEDURE NOTE FOR MIGRAINE HEADACHE  Contraindications and precautions discussed with patient(above). Aseptic procedure was observed and patient tolerated procedure. Procedure performed by Harlene Bogaert, AGNP-BC.   The condition has  existed for more than 6 months, and pt does not have a diagnosis of ALS, Myasthenia Gravis or Lambert-Eaton Syndrome.  Risks and benefits of injections discussed and pt agrees to proceed with the procedure.  Written consent obtained  These injections  are medically necessary. Pt  receives good benefits from these injections. These injections do not cause sedations or hallucinations which the oral therapies may cause.   Description of procedure:  The patient was placed in a sitting position. The standard protocol was used for Botox  as follows, with 5 units of Botox  injected at each site:  -Procerus muscle, midline injection  -Corrugator muscle, bilateral injection  -Frontalis muscle, bilateral injection, with 2 sites each side, medial injection was performed in the upper one third of the frontalis muscle, in the region vertical from the medial inferior edge of the superior orbital rim. The lateral injection was again in the upper one third of the forehead vertically above the lateral limbus of the cornea, 1.5 cm lateral to the medial injection site.  -Temporalis muscle injection, 4 sites, bilaterally. The first injection was 3 cm above the tragus of the ear, second injection site was 1.5 cm to 3 cm up from the first injection site in line with the tragus of the ear. The third injection site was 1.5-3 cm forward between the first 2 injection sites. The fourth injection site was 1.5 cm posterior to the second injection site. 5th site laterally in the temporalis  muscleat the level of the outer canthus.  -Occipitalis muscle injection, 3 sites, bilaterally. The first injection was done one half way between the occipital protuberance and the tip of the mastoid process behind the ear. The second injection site was done lateral and superior to the first, 1 fingerbreadth from the first injection. The third injection site was 1 fingerbreadth superiorly and medially from the first injection site.  -Cervical paraspinal muscle injection, 2 sites, bilaterally. The first injection site was 1 cm from the midline of the cervical spine, 3 cm inferior to the lower border of the occipital protuberance. The second injection site was 1.5 cm superiorly and laterally to  the first injection site.  -Trapezius muscle injection was performed at 3 sites, bilaterally. The first injection site was in the upper trapezius muscle halfway between the inflection point of the neck, and the acromion. The second injection site was one half way between the acromion and the first injection site. The third injection was done between the first injection site and the inflection point of the neck.  -Masseter muscle injection was performed at 1 site, bilaterally    A total of 200 units of Botox  was prepared, 165 units of Botox  was injected as documented above, any Botox  not injected was wasted. The patient tolerated the procedure well, there were no complications of the above procedure.   Harlene Bogaert, AGNP-BC  [facility] [address] [place], [place] [address]  Phone [phone] Fax [phone] Note: This document was prepared with digital dictation and possible smart phrase technology. Any transcriptional errors that result from this process are unintentional.

## 2024-03-30 NOTE — Progress Notes (Signed)
 Botox - 200 units x 1 vial Lot: [id number] Expiration: 09/2026 NDC: 9976-6078-97  Bacteriostatic 0.9% Sodium Chloride - 4mL total Onu:[id number] Expiration: 06/25/2025 NDC: 9590-8033-97  Dx: G43.711 SP  Witnessed by: Augustin NOVAK

## 2024-04-01 ENCOUNTER — Ambulatory Visit: Admitting: Adult Health

## 2024-04-07 ENCOUNTER — Other Ambulatory Visit: Payer: Self-pay | Admitting: Neurology

## 2024-04-07 DIAGNOSIS — G500 Trigeminal neuralgia: Secondary | ICD-10-CM

## 2024-04-07 MED ORDER — CARBAMAZEPINE ER 200 MG PO TB12: 200.0000 mg | ORAL_TABLET | Freq: Two times a day (BID) | ORAL | 3 refills | Status: AC

## 2024-04-13 NOTE — Telephone Encounter (Signed)
 Pt has not read mychart msg, called and spoke with patient and rescheduled Botox  appointment with Dr Ines for 06/29/24 at 1pm

## 2024-04-29 ENCOUNTER — Ambulatory Visit
Admission: RE | Admit: 2024-04-29 | Discharge: 2024-04-29 | Disposition: A | Discharge status: Home / Self Care | Attending: Family Medicine | Admitting: Family Medicine

## 2024-04-29 ENCOUNTER — Ambulatory Visit (INDEPENDENT_AMBULATORY_CARE_PROVIDER_SITE_OTHER)

## 2024-04-29 ENCOUNTER — Other Ambulatory Visit: Payer: Self-pay

## 2024-04-29 VITALS — BP 108/78 | HR 105 | Temp 98.1°F | Resp 16

## 2024-04-29 DIAGNOSIS — R051 Acute cough: Secondary | ICD-10-CM

## 2024-04-29 DIAGNOSIS — J209 Acute bronchitis, unspecified: Secondary | ICD-10-CM | POA: Diagnosis not present

## 2024-04-29 DIAGNOSIS — G500 Trigeminal neuralgia: Secondary | ICD-10-CM | POA: Insufficient documentation

## 2024-04-29 DIAGNOSIS — J069 Acute upper respiratory infection, unspecified: Secondary | ICD-10-CM

## 2024-04-29 LAB — POC SARS CORONAVIRUS 2 AG -  ED: SARS Coronavirus 2 Ag: NEGATIVE

## 2024-04-29 MED ORDER — HYDROCOD POLI-CHLORPHE POLI ER 10-8 MG/5ML PO SUER: 5.0000 mL | Freq: Two times a day (BID) | ORAL | 0 refills | Status: DC | PRN

## 2024-04-29 MED ORDER — AMOXICILLIN-POT CLAVULANATE 875-125 MG PO TABS: 1.0000 | ORAL_TABLET | Freq: Two times a day (BID) | ORAL | 0 refills | Status: DC

## 2024-04-29 MED ORDER — PREDNISONE 20 MG PO TABS: 40.0000 mg | ORAL_TABLET | Freq: Every day | ORAL | 0 refills | Status: DC

## 2024-04-29 NOTE — Discharge Instructions (Signed)
 Make sure you are drinking lots of fluids And notify if you have one available Take the antibiotic 2 times a day until gone Take prednisone  daily for 5 days Take medications with food I have prescribed Tussionex if needed for bad coughing spells See your doctor if not improving by next week

## 2024-04-29 NOTE — ED Provider Notes (Signed)
 [facility]    CSN: [id number] Arrival date & time: 04/29/24  1746      History   Chief Complaint Chief Complaint  Patient presents with   Cough    HPI Shari Booth is a 53 y.o. female.   Patient is here for an upper respiratory infection.  She has had a cough and cold for about a week.  She states that today she started having fever and increased shortness of breath.  Is having worsening lung and respiratory symptoms in spite of the cold symptoms improving.  She thought she might have COVID but a COVID test at home was negative.  She is feeling very weak.  Her husband notes that she is short of breath just walking to the bathroom.  She is doing a lot of coughing.  The cough keeps her awake.  She is very tired.  No underlying lung disease.  Patient is disabled with neuromuscular disease including trigeminal neuralgia, and migraines.  And chronic pain    Past Medical History:  Diagnosis Date   Anxiety    Asthma    Hx with pregnancy only - No inhaler- no current problems   Bell's palsy    at age 47 & age 36, no current problems   Common migraine with intractable migraine 07/29/2016   Depression    Fibromyalgia    IBS (irritable bowel syndrome)    diet controlled   Neuromuscular disorder (HCC)    trigeminal nuralga - not currently active    Patient Active Problem List   Diagnosis Date Noted   Trigeminal neuralgia pain 04/29/2024   Back pain 07/10/2018   Neck pain 07/10/2018   Symptomatic mammary hypertrophy 07/10/2018   Chronic migraine without aura, intractable, with status migrainosus 07/29/2016   S/P laparoscopic hysterectomy 06/12/2015    Past Surgical History:  Procedure Laterality Date   ABDOMINAL HYSTERECTOMY  2016   ABLATION     BREAST REDUCTION SURGERY Bilateral 08/12/2018   Procedure: BILATERAL MAMMARY REDUCTION  (BREAST);  Surgeon: Lowery Estefana RAMAN, DO;  Location: [facility];  Service: Plastics;  Laterality: Bilateral;    ceserean section x 3     COLONOSCOPY     CYSTOSCOPY N/A 06/12/2015   Procedure: CYSTOSCOPY;  Surgeon: Burnard Bowers, MD;  Location: [facility];  Service: Gynecology;  Laterality: N/A;   DILATION AND CURETTAGE OF UTERUS     LAPAROSCOPIC LYSIS OF ADHESIONS  01/12/2018   Procedure: LAPAROSCOPIC LYSIS OF ADHESIONS;  Surgeon: Bowers Burnard, MD;  Location: [facility];  Service: Gynecology;;   LAPAROSCOPY N/A 01/12/2018   Procedure: Diagnostic Laparoscopy;  Surgeon: Bowers Burnard, MD;  Location: [facility];  Service: Gynecology;  Laterality: N/A;   REDUCTION MAMMAPLASTY Bilateral    ROBOTIC ASSISTED LAPAROSCOPIC LYSIS OF ADHESION  06/12/2015   Procedure: ROBOTIC ASSISTED LAPAROSCOPIC LYSIS OF ADHESION;  Surgeon: Burnard Bowers, MD;  Location: [facility];  Service: Gynecology;;   WISDOM TOOTH EXTRACTION      OB History   No obstetric history on file.      Home Medications    Prior to Admission medications   Medication Sig Start Date End Date Taking? Authorizing Provider  amoxicillin -clavulanate (AUGMENTIN ) 875-125 MG tablet Take 1 tablet by mouth every 12 (twelve) hours. 04/29/24  Yes Maranda Jamee Jacob, MD  chlorpheniramine-HYDROcodone  (TUSSIONEX) 10-8 MG/5ML Take 5 mLs by mouth every 12 (twelve) hours as needed for cough. 04/29/24  Yes Maranda Jamee Jacob, MD  predniSONE  (DELTASONE ) 20 MG tablet Take 2 tablets (  40 mg total) by mouth daily with breakfast. 04/29/24  Yes Maranda Jamee Jacob, MD  topiramate  (TOPAMAX ) 50 MG tablet Take 50 mg by mouth 2 (two) times daily.   Yes [provider]  BOTOX  200 units injection INJECT 155 UNITS INTRAMUSCULARLY EVERY 3 MONTHS 08/26/23   Ines Onetha NOVAK, MD  carbamazepine  (TEGRETOL -XR) 200 MG 12 hr tablet Take 1 tablet (200 mg total) by mouth 2 (two) times daily. 04/07/24   Ines Onetha NOVAK, MD  cyclobenzaprine  (FLEXERIL ) 5 MG tablet Take 1 tablet (5 mg total) by mouth every 8 (eight) hours as needed for muscle spasms. 02/17/24   Ines Onetha NOVAK, MD  desvenlafaxine  (PRISTIQ) 100 MG 24 hr tablet Take 100 mg by mouth daily.    [provider]  Multiple Vitamin (MULTIVITAMIN WITH MINERALS) TABS tablet Take 1 tablet by mouth daily.    [provider]  Rimegepant Sulfate (NURTEC) 75 MG TBDP Take 75 mg by mouth daily as needed. For migraines. Take as close to onset of migraine as possible. One daily maximum. 10/10/20   Ines Onetha NOVAK, MD  WEGOVY  1.7 MG/0.75ML [name]  04/29/23   [provider]    Family History Family History  Problem Relation Age of Onset   High blood pressure Mother    Migraines Mother    High Cholesterol Father    Aortic stenosis Father    Arrhythmia Father        Pacemaker   Breast cancer Maternal Grandmother    Breast cancer Maternal Aunt    Lung cancer Maternal Aunt    Mental retardation Daughter     Social History Social History   Tobacco Use   Smoking status: Former    Current packs/day: 0.00    Average packs/day: 0.3 packs/day for 20.0 years (5.0 ttl pk-yrs)    Types: Cigarettes    Start date: 08/26/1996    Quit date: 08/26/2016    Years since quitting: 7.6   Smokeless tobacco: Never  Vaping Use   Vaping status: Never Used  Substance Use Topics   Alcohol use: Yes    Comment: occ   Drug use: No     Allergies   Topamax  [topiramate ] and Zonegran  [zonisamide ]   Review of Systems Review of Systems  See HPI Physical Exam Triage Vital Signs ED Triage Vitals  Encounter Vitals Group     BP 04/29/24 1752 108/78     Girls Systolic BP Percentile --      Girls Diastolic BP Percentile --      Boys Systolic BP Percentile --      Boys Diastolic BP Percentile --      Pulse Rate 04/29/24 1752 (!) 105     Resp 04/29/24 1752 16     Temp 04/29/24 1752 98.1 F (36.7 C)     Temp Source 04/29/24 1752 Oral     SpO2 04/29/24 1752 95 %     Weight --      Height --      Head Circumference --      Peak Flow --      Pain Score 04/29/24 1755 8     Pain Loc --      Pain Education --      Exclude  from Growth Chart --    No data found.  Updated Vital Signs BP 108/78   Pulse (!) 105   Temp 98.1 F (36.7 C) (Oral)   Resp 16   SpO2 95%  Physical Exam Constitutional:      General: She is not in acute distress.    Appearance: She is well-developed. She is ill-appearing.  HENT:     Head: Normocephalic and atraumatic.     Right Ear: Tympanic membrane normal.     Left Ear: Tympanic membrane normal.     Nose: Nose normal.     Mouth/Throat:     Mouth: Mucous membranes are moist.  Eyes:     Conjunctiva/sclera: Conjunctivae normal.     Pupils: Pupils are equal, round, and reactive to light.  Cardiovascular:     Rate and Rhythm: Normal rate and regular rhythm.     Heart sounds: Normal heart sounds.  Pulmonary:     Effort: Pulmonary effort is normal. No respiratory distress.     Breath sounds: Rhonchi present. No wheezing.     Comments: Decreased breath sounds bilaterally Abdominal:     General: There is no distension.     Palpations: Abdomen is soft.  Musculoskeletal:        General: Normal range of motion.     Cervical back: Normal range of motion.  Skin:    General: Skin is warm and dry.  Neurological:     Mental Status: She is alert.      UC Treatments / Results  Labs (all labs ordered are listed, but only abnormal results are displayed) Labs Reviewed  POC SARS CORONAVIRUS 2 AG -  ED    EKG   Radiology DG Chest 2 View Result Date: 04/29/2024 CLINICAL DATA:  cough and SOB EXAM: CHEST - 2 VIEW COMPARISON:  October 07, 2021 FINDINGS: No focal airspace consolidation, pleural effusion, or pneumothorax. No cardiomegaly.No acute fracture or destructive lesion. IMPRESSION: No acute cardiopulmonary abnormality. Electronically Signed   By: Rogelia Myers M.D.   On: 04/29/2024 18:23    Procedures Procedures (including critical care time)  Medications Ordered in UC Medications - No data to display  Initial Impression / Assessment and Plan / UC Course  I  have reviewed the triage vital signs and the nursing notes.  Pertinent labs & imaging results that were available during my care of the patient were reviewed by me and considered in my medical decision making (see chart for details).     COVID is negative.  Chest x-ray is negative.  Concern for worsening symptoms in spite of 7 days of viral illness.  Developing a fever at this point is worrisome.  Will treat with antibiotics, prednisone .  Rest and fluids.  See PCP if not improving by next week Final Clinical Impressions(s) / UC Diagnoses   Final diagnoses:  Acute cough  Viral URI with cough  Acute bronchitis, unspecified organism     Discharge Instructions      Make sure you are drinking lots of fluids And notify if you have one available Take the antibiotic 2 times a day until gone Take prednisone  daily for 5 days Take medications with food I have prescribed Tussionex if needed for bad coughing spells See your doctor if not improving by next week   ED Prescriptions     Medication Sig Dispense Auth. Provider   amoxicillin -clavulanate (AUGMENTIN ) 875-125 MG tablet Take 1 tablet by mouth every 12 (twelve) hours. 14 tablet Maranda Jamee Jacob, MD   predniSONE  (DELTASONE ) 20 MG tablet Take 2 tablets (40 mg total) by mouth daily with breakfast. 10 tablet Maranda Jamee Jacob, MD   chlorpheniramine-HYDROcodone  (TUSSIONEX) 10-8 MG/5ML Take 5 mLs by mouth every 12 (twelve)  hours as needed for cough. 115 mL Maranda Jamee Jacob, MD      I have reviewed the PDMP during this encounter.   Maranda Jamee Jacob, MD 04/29/24 [phone]

## 2024-04-29 NOTE — ED Triage Notes (Signed)
 Has been sick for 1 week. Has c/o fatigue, sob, low grade fever, sore throat, cough, body aches. Has been taking tylenol  cold and cough, nyquil.

## 2024-04-30 ENCOUNTER — Other Ambulatory Visit: Payer: Self-pay | Admitting: Neurology

## 2024-05-13 ENCOUNTER — Ambulatory Visit
Admission: RE | Admit: 2024-05-13 | Discharge: 2024-05-13 | Disposition: A | Discharge status: Home / Self Care | Attending: Family Medicine | Admitting: Family Medicine

## 2024-05-13 VITALS — BP 96/64 | HR 81 | Temp 97.8°F | Resp 18

## 2024-05-13 DIAGNOSIS — R0602 Shortness of breath: Secondary | ICD-10-CM | POA: Diagnosis not present

## 2024-05-13 MED ORDER — ALBUTEROL SULFATE (2.5 MG/3ML) 0.083% IN NEBU
2.5000 mg | INHALATION_SOLUTION | Freq: Once | RESPIRATORY_TRACT | Status: AC
  Administered 2024-05-13: 2.5 mg via RESPIRATORY_TRACT

## 2024-05-13 MED ORDER — PREDNISONE 20 MG PO TABS: 20.0000 mg | ORAL_TABLET | Freq: Every day | ORAL | 0 refills | Status: DC

## 2024-05-13 MED ORDER — ALBUTEROL SULFATE (2.5 MG/3ML) 0.083% IN NEBU: 2.5000 mg | INHALATION_SOLUTION | Freq: Four times a day (QID) | RESPIRATORY_TRACT | 0 refills | Status: AC | PRN

## 2024-05-13 NOTE — ED Provider Notes (Signed)
 [facility]    CSN: [id number] Arrival date & time: 05/13/24  1730      History   Chief Complaint Chief Complaint  Patient presents with   URI    URI has improved but I still have shortness of breath & pressure on chest. - Entered by patient   Shortness of Breath    HPI Shari Booth is a 53 y.o. female.   Patient was seen by me about a week and a half ago.  She was treated for an upper she states that she feels largely improved but is concerned because she still has a heavy sensation in her chest and a sensation of shortness of breath with exertion and with conversation.  She states she did smoke for 20 years.  Wonders whether she had damaged her lungs.  No longer has any coughing congestion or sign of infection.  Never been diagnosed with asthma or emphysema    Past Medical History:  Diagnosis Date   Anxiety    Asthma    Hx with pregnancy only - No inhaler- no current problems   Bell's palsy    at age 57 & age 77, no current problems   Common migraine with intractable migraine 07/29/2016   Depression    Fibromyalgia    IBS (irritable bowel syndrome)    diet controlled   Neuromuscular disorder (HCC)    trigeminal nuralga - not currently active    Patient Active Problem List   Diagnosis Date Noted   Trigeminal neuralgia pain 04/29/2024   Back pain 07/10/2018   Neck pain 07/10/2018   Symptomatic mammary hypertrophy 07/10/2018   Chronic migraine without aura, intractable, with status migrainosus 07/29/2016   S/P laparoscopic hysterectomy 06/12/2015    Past Surgical History:  Procedure Laterality Date   ABDOMINAL HYSTERECTOMY  2016   ABLATION     BREAST REDUCTION SURGERY Bilateral 08/12/2018   Procedure: BILATERAL MAMMARY REDUCTION  (BREAST);  Surgeon: Lowery Estefana RAMAN, DO;  Location: [facility];  Service: Plastics;  Laterality: Bilateral;   ceserean section x 3     COLONOSCOPY     CYSTOSCOPY N/A 06/12/2015   Procedure:  CYSTOSCOPY;  Surgeon: Burnard Bowers, MD;  Location: [facility];  Service: Gynecology;  Laterality: N/A;   DILATION AND CURETTAGE OF UTERUS     LAPAROSCOPIC LYSIS OF ADHESIONS  01/12/2018   Procedure: LAPAROSCOPIC LYSIS OF ADHESIONS;  Surgeon: Bowers Burnard, MD;  Location: [facility];  Service: Gynecology;;   LAPAROSCOPY N/A 01/12/2018   Procedure: Diagnostic Laparoscopy;  Surgeon: Bowers Burnard, MD;  Location: [facility];  Service: Gynecology;  Laterality: N/A;   REDUCTION MAMMAPLASTY Bilateral    ROBOTIC ASSISTED LAPAROSCOPIC LYSIS OF ADHESION  06/12/2015   Procedure: ROBOTIC ASSISTED LAPAROSCOPIC LYSIS OF ADHESION;  Surgeon: Burnard Bowers, MD;  Location: [facility];  Service: Gynecology;;   WISDOM TOOTH EXTRACTION      OB History   No obstetric history on file.      Home Medications    Prior to Admission medications   Medication Sig Start Date End Date Taking? Authorizing Provider  albuterol  (PROVENTIL ) (2.5 MG/3ML) 0.083% nebulizer solution Take 3 mLs (2.5 mg total) by nebulization every 6 (six) hours as needed for wheezing or shortness of breath. 05/13/24  Yes Maranda Jamee Jacob, MD  BOTOX  200 units injection INJECT 155 UNITS INTRAMUSCULARLY EVERY 3 MONTHS 08/26/23  Yes Ines Onetha KATHEE, MD  carbamazepine  (TEGRETOL -XR) 200 MG 12 hr tablet Take 1 tablet (200 mg total)  by mouth 2 (two) times daily. 04/07/24  Yes Ines Onetha NOVAK, MD  cyclobenzaprine  (FLEXERIL ) 5 MG tablet TAKE 1 TABLET BY MOUTH EVERY 8 HOURS AS NEEDED FOR MUSCLE SPASMS 05/04/24  Yes Ines Onetha NOVAK, MD  desvenlafaxine (PRISTIQ) 100 MG 24 hr tablet Take 100 mg by mouth daily.   Yes [provider]  Multiple Vitamin (MULTIVITAMIN WITH MINERALS) TABS tablet Take 1 tablet by mouth daily.   Yes [provider]  Rimegepant Sulfate (NURTEC) 75 MG TBDP Take 75 mg by mouth daily as needed. For migraines. Take as close to onset of migraine as possible. One daily maximum. 10/10/20  Yes Ines Onetha NOVAK, MD  topiramate  (TOPAMAX )  50 MG tablet Take 50 mg by mouth 2 (two) times daily.   Yes [provider]  WEGOVY  1.7 MG/0.75ML SOAJ  04/29/23  Yes [provider]  predniSONE  (DELTASONE ) 20 MG tablet Take 1 tablet (20 mg total) by mouth daily with breakfast. 05/13/24   Maranda Jamee Jacob, MD    Family History Family History  Problem Relation Age of Onset   High blood pressure Mother    Migraines Mother    High Cholesterol Father    Aortic stenosis Father    Arrhythmia Father        Pacemaker   Breast cancer Maternal Grandmother    Breast cancer Maternal Aunt    Lung cancer Maternal Aunt    Mental retardation Daughter     Social History Social History   Tobacco Use   Smoking status: Former    Current packs/day: 0.00    Average packs/day: 0.3 packs/day for 20.0 years (5.0 ttl pk-yrs)    Types: Cigarettes    Start date: 08/26/1996    Quit date: 08/26/2016    Years since quitting: 7.7   Smokeless tobacco: Never  Vaping Use   Vaping status: Never Used  Substance Use Topics   Alcohol use: Yes    Comment: occ   Drug use: No     Allergies   Topamax  [topiramate ] and Zonegran  [zonisamide ]   Review of Systems Review of Systems  See HPI Physical Exam Triage Vital Signs ED Triage Vitals  Encounter Vitals Group     BP 05/13/24 1741 96/64     Girls Systolic BP Percentile --      Girls Diastolic BP Percentile --      Boys Systolic BP Percentile --      Boys Diastolic BP Percentile --      Pulse Rate 05/13/24 1741 81     Resp 05/13/24 1741 18     Temp 05/13/24 1741 97.8 F (36.6 C)     Temp Source 05/13/24 1741 Oral     SpO2 05/13/24 1741 98 %     Weight --      Height --      Head Circumference --      Peak Flow --      Pain Score 05/13/24 1837 0     Pain Loc --      Pain Education --      Exclude from Growth Chart --    No data found.  Updated Vital Signs BP 96/64 (BP Location: Right Arm)   Pulse 81   Temp 97.8 F (36.6 C) (Oral)   Resp 18   SpO2 98%       Physical  Exam Constitutional:      General: She is not in acute distress.    Appearance: She is well-developed and  normal weight.  HENT:     Head: Normocephalic and atraumatic.  Eyes:     Conjunctiva/sclera: Conjunctivae normal.     Pupils: Pupils are equal, round, and reactive to light.  Cardiovascular:     Rate and Rhythm: Normal rate.  Pulmonary:     Effort: Pulmonary effort is normal. No respiratory distress.     Breath sounds: Examination of the right-lower field reveals decreased breath sounds. Examination of the left-lower field reveals decreased breath sounds. Decreased breath sounds present.  Abdominal:     General: There is no distension.     Palpations: Abdomen is soft.  Musculoskeletal:        General: Normal range of motion.     Cervical back: Normal range of motion.  Skin:    General: Skin is warm and dry.  Neurological:     Mental Status: She is alert.      UC Treatments / Results  Labs (all labs ordered are listed, but only abnormal results are displayed) Labs Reviewed - No data to display  EKG   Radiology No results found.  Procedures Procedures (including critical care time)  Medications Ordered in UC Medications  albuterol  (PROVENTIL ) (2.5 MG/3ML) 0.083% nebulizer solution 2.5 mg (2.5 mg Nebulization Given 05/13/24 1812)    Initial Impression / Assessment and Plan / UC Course  I have reviewed the triage vital signs and the nursing notes.  Pertinent labs & imaging results that were available during my care of the patient were reviewed by me and considered in my medical decision making (see chart for details).     Patient has slightly diminished lung sounds at her bases.  After the albuterol  treatment she felt better and her lungs sound more clear.  Will treat with albuterol  by nebulizer (patient has at home).  Will give her a few more days of prednisone  since she felt better while taking prednisone .Understands need to follow-up with PCP if fails to  improve Final Clinical Impressions(s) / UC Diagnoses   Final diagnoses:  SOB (shortness of breath) on exertion     Discharge Instructions      Take prednisone  1 a day for 5 days Continue drink lots of fluids Humidify the air if you are able Use the nebulizer 3-4 times a day as needed for shortness of breath See your doctor if you do not see gradual improvement over the next week or 2   ED Prescriptions     Medication Sig Dispense Auth. Provider   predniSONE  (DELTASONE ) 20 MG tablet Take 1 tablet (20 mg total) by mouth daily with breakfast. 5 tablet Maranda Jamee Jacob, MD   albuterol  (PROVENTIL ) (2.5 MG/3ML) 0.083% nebulizer solution Take 3 mLs (2.5 mg total) by nebulization every 6 (six) hours as needed for wheezing or shortness of breath. 75 mL Maranda Jamee Jacob, MD      PDMP not reviewed this encounter.   Maranda Jamee Jacob, MD 05/13/24 [phone]

## 2024-05-13 NOTE — ED Triage Notes (Signed)
 Patient c/o SOB and heaviness feeling on her chest x 2 weeks.  Patient recently treated for bronchitis and given prednisone  which helped.  Patient does get winded when walking to the bathroom.

## 2024-05-13 NOTE — Discharge Instructions (Signed)
 Take prednisone  1 a day for 5 days Continue drink lots of fluids Humidify the air if you are able Use the nebulizer 3-4 times a day as needed for shortness of breath See your doctor if you do not see gradual improvement over the next week or 2

## 2024-05-17 NOTE — Telephone Encounter (Signed)
 How long has she been on Tegretol  200 mg twice daily?  If it has been over 2 weeks, she may increase it to 200 mg 3 times daily at this point.

## 2024-05-26 ENCOUNTER — Telehealth: Payer: Self-pay | Admitting: Neurology

## 2024-05-26 NOTE — Telephone Encounter (Signed)
 LVM and sent mychart msg in 03/24/24 encounter asking for patient to call back to discuss appointments with Dr. Ines - MD departure

## 2024-06-02 NOTE — Telephone Encounter (Signed)
 I called and r/s her with Dr. Buck for 11/5 @ 1:45 pm. I offered to get her in with Megan or Harlene (she has seen them both in the past) but she requested to be injected by MD this time to establish care with her.

## 2024-06-02 NOTE — Telephone Encounter (Signed)
 Pt has called asking to be called for the reassigning to another provider and to r/s her botox  appointment

## 2024-06-22 ENCOUNTER — Ambulatory Visit: Admitting: Adult Health

## 2024-06-23 ENCOUNTER — Ambulatory Visit: Admitting: Neurology

## 2024-06-24 ENCOUNTER — Ambulatory Visit: Admitting: Adult Health

## 2024-06-29 ENCOUNTER — Ambulatory Visit: Admitting: Neurology

## 2024-06-30 ENCOUNTER — Ambulatory Visit: Admitting: Neurology

## 2024-06-30 ENCOUNTER — Telehealth: Payer: Self-pay | Admitting: Neurology

## 2024-06-30 VITALS — BP 107/70

## 2024-06-30 DIAGNOSIS — G500 Trigeminal neuralgia: Secondary | ICD-10-CM | POA: Diagnosis not present

## 2024-06-30 DIAGNOSIS — F5101 Primary insomnia: Secondary | ICD-10-CM | POA: Insufficient documentation

## 2024-06-30 DIAGNOSIS — E7410 Disorder of fructose metabolism, unspecified: Secondary | ICD-10-CM | POA: Insufficient documentation

## 2024-06-30 DIAGNOSIS — G43711 Chronic migraine without aura, intractable, with status migrainosus: Secondary | ICD-10-CM

## 2024-06-30 DIAGNOSIS — E782 Mixed hyperlipidemia: Secondary | ICD-10-CM | POA: Insufficient documentation

## 2024-06-30 MED ORDER — AIMOVIG 70 MG/ML ~~LOC~~ SOAJ: 70.0000 mg | SUBCUTANEOUS | 11 refills | Status: AC

## 2024-06-30 MED ORDER — NURTEC 75 MG PO TBDP: 75.0000 mg | ORAL_TABLET | Freq: Every day | ORAL | 5 refills | Status: AC | PRN

## 2024-06-30 MED ORDER — ONABOTULINUMTOXINA 200 UNITS IJ SOLR: 200.0000 [IU] | Freq: Once | INTRAMUSCULAR | Status: AC

## 2024-06-30 MED ORDER — CYCLOBENZAPRINE HCL 5 MG PO TABS: 5.0000 mg | ORAL_TABLET | Freq: Three times a day (TID) | ORAL | 11 refills | Status: AC | PRN

## 2024-06-30 NOTE — Progress Notes (Signed)
 Office Visit Note  Patient: Shari Booth             Date of Birth: 24-Feb-1971           MRN: [id number]             PCP: Cleotilde Planas, MD Referring: Cleotilde Planas, MD Visit Date: 07/14/2024 Occupation: Data Unavailable  Subjective:  Pain in multiple joints and muscles  History of Present Illness: Shari Booth is a 53 y.o. female seen for the evaluation of polyarthralgia and fatigue.  Patient states that she has had fatigue since she was in her 31s.  She also noticed intermittent increased pain and migraines since she was young.  She has never seen joint swelling but she continues to have joint pain and muscle pain.  She states she has had 2 episodes of Bell's palsy and she has had intermittent trigeminal neuralgia since she was in her 30s.  She has been under care of [facility] neurology.  She states that 1 time she was seen by an allergist who diagnosed her with fibromyalgia syndrome.  5 years ago she was tested for Lyme which was negative.  She also has symptoms of IBS since she was 19.  She describes discomfort in her cervical thoracic and lumbar spine.  She also has discomfort in her shoulders, wrist, knees and her feet.  She had plantar fasciitis x 2.  She gives history of Raynaud's phenomenon since she was in her 107s.  There is no history of digital ulcers.  She gives history of fatigue, dry mouth, and photosensitivity.  There is history of ulcerative colitis in her maternal grandmother and fibromyalgia in her daughter.  She is right handed, a speech therapist.  She quit working due to trigeminal neuralgia's.  She walks for exercise.  She is married, gravida 4, para 4, abortion 1.  There is no history of preeclampsia or DVTs.  She drinks alcohol socially.  She quit smoking in 2015.  She smoked 1 pack/day for 22 years.    Activities of Daily Living:  Patient reports morning stiffness for 30 minutes.   Patient Reports nocturnal pain.  Difficulty dressing/grooming: Denies Difficulty  climbing stairs: Reports Difficulty getting out of chair: Denies Difficulty using hands for taps, buttons, cutlery, and/or writing: Denies  Review of Systems  Constitutional:  Positive for fatigue.  HENT:  Positive for mouth dryness. Negative for mouth sores.   Eyes:  Negative for dryness.  Respiratory:  Negative for difficulty breathing.   Cardiovascular:  Negative for chest pain and palpitations.  Gastrointestinal:  Positive for constipation and diarrhea. Negative for blood in stool.  Endocrine: Negative for increased urination.  Genitourinary:  Negative for involuntary urination.  Musculoskeletal:  Positive for joint pain, gait problem, joint pain, myalgias, morning stiffness, muscle tenderness and myalgias. Negative for joint swelling and muscle weakness.  Skin:  Positive for color change and sensitivity to sunlight. Negative for rash and hair loss.  Allergic/Immunologic: Positive for susceptible to infections.  Neurological:  Positive for numbness and headaches. Negative for dizziness.  Hematological:  Negative for swollen glands.  Psychiatric/Behavioral:  Positive for depressed mood. Negative for sleep disturbance. The patient is nervous/anxious.     PMFS History:  Patient Active Problem List   Diagnosis Date Noted   Fructose intolerance 06/30/2024   Primary insomnia 06/30/2024   Mixed hyperlipidemia 06/30/2024   Trigeminal neuralgia of right side of face 04/29/2024   Back pain 07/10/2018   Neck pain 07/10/2018  Symptomatic mammary hypertrophy 07/10/2018   Chronic migraine without aura, with intractable migraine, so stated, with status migrainosus 07/29/2016   S/P laparoscopic hysterectomy 06/12/2015    Past Medical History:  Diagnosis Date   Anxiety    Asthma    Hx with pregnancy only - No inhaler- no current problems   Bell's palsy    at age 33 & age 68, no current problems   Common migraine with intractable migraine 07/29/2016   Depression    Fibromyalgia    IBS  (irritable bowel syndrome)    diet controlled   Neuromuscular disorder (HCC)    trigeminal nuralga - not currently active    Family History  Problem Relation Age of Onset   High blood pressure Mother    Migraines Mother    Anxiety disorder Mother    Kidney disease Mother    High Cholesterol Father    Aortic stenosis Father    Arrhythmia Father        Pacemaker   Obesity Father    Prostate cancer Brother    Anxiety disorder Brother    Depression Brother    Breast cancer Maternal Aunt    Lung cancer Maternal Aunt    Breast cancer Maternal Grandmother    Fibromyalgia Daughter    Colitis Daughter    Anxiety disorder Daughter    Anxiety disorder Daughter    Gallbladder disease Daughter        removed   Depression Daughter    Past Surgical History:  Procedure Laterality Date   ABDOMINAL HYSTERECTOMY  2016   ABLATION     BREAST REDUCTION SURGERY Bilateral 08/12/2018   Procedure: BILATERAL MAMMARY REDUCTION  (BREAST);  Surgeon: Lowery Estefana RAMAN, DO;  Location:  S[facility]ERY C[facility];  Service: Plastics;  Laterality: Bilateral;   ceserean section x 3     COLONOSCOPY     CYSTOSCOPY N/A 06/12/2015   Procedure: CYSTOSCOPY;  Surgeon: Burnard Bowers, MD;  Location: [facility];  Service: Gynecology;  Laterality: N/A;   DILATION AND CURETTAGE OF UTERUS     LAPAROSCOPIC LYSIS OF ADHESIONS  01/12/2018   Procedure: LAPAROSCOPIC LYSIS OF ADHESIONS;  Surgeon: Bowers Burnard, MD;  Location: [facility];  Service: Gynecology;;   LAPAROSCOPY N/A 01/12/2018   Procedure: Diagnostic Laparoscopy;  Surgeon: Bowers Burnard, MD;  Location: [facility];  Service: Gynecology;  Laterality: N/A;   REDUCTION MAMMAPLASTY Bilateral    ROBOTIC ASSISTED LAPAROSCOPIC LYSIS OF ADHESION  06/12/2015   Procedure: ROBOTIC ASSISTED LAPAROSCOPIC LYSIS OF ADHESION;  Surgeon: Burnard Bowers, MD;  Location: [facility];  Service: Gynecology;;   si joint fusion Left 2019   WISDOM TOOTH EXTRACTION     Social History   Tobacco  Use   Smoking status: Former    Current packs/day: 0.00    Average packs/day: 0.3 packs/day for 20.0 years (5.0 ttl pk-yrs)    Types: Cigarettes    Start date: 08/26/1996    Quit date: 08/26/2016    Years since quitting: 7.8    Passive exposure: Never   Smokeless tobacco: Never  Vaping Use   Vaping status: Never Used  Substance Use Topics   Alcohol use: Yes    Comment: occ   Drug use: No   Social History   Social History Narrative   Lives at home w/ her husband and children   Right-handed   Caffeine : 1-2 cups per day      There is no immunization history on file for this patient.   Objective: Vital  Signs: BP 117/78 (BP Location: Right Arm, Patient Position: Sitting, Cuff Size: Normal)   Pulse 87   Temp 98 F (36.7 C)   Resp 14   Ht 5' 4.5 (1.638 m)   Wt 182 lb 3.2 oz (82.6 kg)   BMI 30.79 kg/m    Physical Exam Vitals and nursing note reviewed.  Constitutional:      Appearance: She is well-developed.  HENT:     Head: Normocephalic and atraumatic.  Eyes:     Conjunctiva/sclera: Conjunctivae normal.  Cardiovascular:     Rate and Rhythm: Normal rate and regular rhythm.     Heart sounds: Normal heart sounds.  Pulmonary:     Effort: Pulmonary effort is normal.     Breath sounds: Normal breath sounds.  Abdominal:     General: Bowel sounds are normal.     Palpations: Abdomen is soft.  Musculoskeletal:     Cervical back: Normal range of motion.  Lymphadenopathy:     Cervical: No cervical adenopathy.  Skin:    General: Skin is warm and dry.     Capillary Refill: Capillary refill takes less than 2 seconds.  Neurological:     Mental Status: She is alert and oriented to person, place, and time.  Psychiatric:        Behavior: Behavior normal.      Musculoskeletal Exam: Cervical, thoracic and lumbar spine were in good range of motion.  There was no SI joint tenderness.  Shoulder joints, elbow joints, wrist joints, MCPs, PIPs and DIPs were in good range of motion  with no synovitis.  Hip joints and knee joints were in good range of motion without any warmth swelling or effusion.  There was no tenderness over ankles or MTPs.  She had positive tender points in the trapezius region, costochondral region, lateral epicondyle region and over the medial aspect of her knees.  Joint laxity was noted.   CDAI Exam: CDAI Score: -- Patient Global: --; Provider Global: -- Swollen: --; Tender: -- Joint Exam 07/14/2024   No joint exam has been documented for this visit   There is currently no information documented on the homunculus. Go to the Rheumatology activity and complete the homunculus joint exam.  Investigation: No additional findings.  Imaging: No results found.  Recent Labs: Lab Results  Component Value Date   WBC 4.9 11/12/2022   HGB 13.8 11/12/2022   PLT 228 11/12/2022   NA 132 (L) 11/12/2022   K 3.7 11/12/2022   CL 103 11/12/2022   CO2 24 11/12/2022   GLUCOSE 83 11/12/2022   BUN 10 11/12/2022   CREATININE 0.99 11/12/2022   BILITOT 0.4 11/12/2022   ALKPHOS 62 11/12/2022   AST 16 11/12/2022   ALT 12 11/12/2022   PROT 6.8 11/12/2022   ALBUMIN 3.6 11/12/2022   CALCIUM 8.1 (L) 11/12/2022   GFRAA >60 01/09/2018   August 18, 2023 TSH normal, CBC WBC 5.8, hemoglobin 14.8, platelets 275, CMP creatinine 0.74, AST 14, ALT 9,, vitamin D 39.9 Jan 13, 2023 TSH 2.3, vitamin D32.4, LDL 115 November 05, 2022 UA negative, lipase normal  Speciality Comments: No specialty comments available.  Procedures:  No procedures performed Allergies: Topamax  [topiramate ] and Zonegran  [zonisamide ]   Assessment / Plan:     Visit Diagnoses: Polyarthralgia -she complains of pain and discomfort in her joints since she was in her 70s.  She complains of discomfort in her entire spine, shoulders, wrists, knees, and feet.  No synovitis was noted on the examination.  She had no point tenderness over her spine.  Plan: Sedimentation rate, Rheumatoid factor, Cyclic citrul  peptide antibody, IgG  Neck pain-she complains of bilateral trapezius spasm.  Tenderness was noted over the trapezius region.  Pes cavus of both feet -she had bilateral past cavus and hammertoes.  She also had callus formation under her metatarsals.  Use of orthotics was advised.  Metatarsal pads was advised.  Joint laxity-she has joint laxity in most of her joints.  Association of joint laxity with osteoarthritis and fibromyalgia was discussed.  Raynaud's disease without gangrene -she gives history of Raynaud's phenomenon since she was in her 30s.  She had good capillary refill without any nailbed capillary changes or sclerodactyly.  Will obtain following labs today.  Plan: ANA, Anti-scleroderma antibody, RNP Antibody, Anti-Smith antibody, Sjogrens syndrome-A extractable nuclear antibody, Sjogrens syndrome-B extractable nuclear antibody, Anti-DNA antibody, double-stranded, C3 and C4, Beta-2 glycoprotein antibodies, Cardiolipin antibodies, IgG, IgM, IgA  Myalgia -she gives history of generalized muscle pain.  Patient was diagnosed with fibromyalgia syndrome in the past.  She has some hyperalgesia, positive tender points and generalized pain which fits in the category of fibromyalgia.  Detailed counsel regarding fibromyalgia syndrome was provided.  Benefits of water  aerobics and swimming were discussed.  Benefits of estrogen were discussed.  Plan: CK.  I will refer her to integrative therapies.  Other fatigue -she has been experiencing fatigue since she was in her 63s.  Plan: CBC with Differential/Platelet, Comprehensive metabolic panel with GFR, Vitamin B12, Serum protein electrophoresis with reflex  Vitamin D deficiency -gives history of vitamin D deficiency.  She has been on vitamin D.  Plan: VITAMIN D 25 Hydroxy (Vit-D Deficiency, Fractures)  Trigeminal neuralgia pain - Right, since 2010.  She is followed at [facility].  Other medical problems are listed as follows:  Mixed hyperlipidemia  Irritable  bowel syndrome with both constipation and diarrhea  Primary insomnia-sleep hygiene was discussed.  Fructose intolerance  Trichotillomania  Chronic migraine without aura, intractable, with status migrainosus  Generalized anxiety disorder  Attention deficit hyperactivity disorder (ADHD), combined type  Former smoker - Quit smoking 2015.  She smoked 1 pack/day for about 22 years.      Orders: Orders Placed This Encounter  Procedures   CBC with Differential/Platelet   Comprehensive metabolic panel with GFR   Sedimentation rate   CK   Vitamin B12   Rheumatoid factor   Cyclic citrul peptide antibody, IgG   ANA   VITAMIN D 25 Hydroxy (Vit-D Deficiency, Fractures)   Anti-scleroderma antibody   RNP Antibody   Anti-Smith antibody   Sjogrens syndrome-A extractable nuclear antibody   Sjogrens syndrome-B extractable nuclear antibody   Anti-DNA antibody, double-stranded   C3 and C4   Beta-2 glycoprotein antibodies   Cardiolipin antibodies, IgG, IgM, IgA   Serum protein electrophoresis with reflex   Ambulatory referral to Physical Therapy   No orders of the defined types were placed in this encounter.    Follow-Up Instructions: Return for Polyarthralgia, myalgia.   Maya Nash, MD  Note - This record has been created using Animal nutritionist.  Chart creation errors have been sought, but may not always  have been located. Such creation errors do not reflect on  the standard of medical care.

## 2024-06-30 NOTE — Telephone Encounter (Signed)
LVM and sent mychart msg informing pt of appt change- MD out. 

## 2024-06-30 NOTE — Patient Instructions (Signed)
 Meds ordered this encounter  Medications   cyclobenzaprine  (FLEXERIL ) 5 MG tablet    Sig: Take 1 tablet (5 mg total) by mouth every 8 (eight) hours as needed for muscle spasms.    Dispense:  90 tablet    Refill:  11   Erenumab-aooe (AIMOVIG) 70 MG/ML SOAJ    Sig: Inject 70 mg into the skin every 30 (thirty) days.    Dispense:  1.12 mL    Refill:  11   Rimegepant Sulfate (NURTEC) 75 MG TBDP    Sig: Take 1 tablet (75 mg total) by mouth daily as needed. For migraines. Take as close to onset of migraine as possible. One daily maximum.    Dispense:  9 tablet    Refill:  5

## 2024-06-30 NOTE — Progress Notes (Signed)
 Botox - 200 units x 1 vial Lot: [id number] Expiration: 2028/03 NDC: 0023-3921-02  Bacteriostatic 0.9% Sodium Chloride - 4 mL  Lot: [id number] Expiration: 06/25/25 NDC: 9590803397  Dx:  H56.288     S/P  Witnessed by [facility]

## 2024-06-30 NOTE — Progress Notes (Signed)
 Chief Complaint  Patient presents with   Injections    Rm12, husband present, pt is ready for botox        Shari Booth is a 53 y.o. female  Right trigeminal neuralgia  Under reasonable control with current polypharmacy Tegretol -XR 200 twice a day, oxycodone  ER 18 mg twice a day  Chronic migraine headaches  Botox  injection for chronic migraine prevention, injection was performed according to [place] protocol,  5 units of Botox  was injected into each side, for 31 injection sites, total of 155 units  Bilateral frontalis 4 injection sites Bilateral corrugate 2 injection sites Procerus 1 injection sites. Bilateral temporalis 8 injection sites Bilateral occipitalis 6 injection sites Bilateral cervical paraspinals 4 injection sites Bilateral upper trapezius 6 injection sites  Extra 45 unites were injected into bilateral cervical, upper trapezius muscles,  DIAGNOSTIC DATA (LABS, IMAGING, TESTING) - I reviewed patient records, labs, notes, testing and imaging myself where available.   MEDICAL HISTORY:  Shari Booth is a 53 year old female, patient Dr. Ines, return for Botox  injection as migraine prevention, his primary care physician is Dr. Cleotilde, [place],   History is obtained from the patient and review of electronic medical records. I personally reviewed pertinent available imaging films in PACS.   PMHx of  Depression Right Trigeminal neuralgia.  She had a history of right trigeminal neuralgia since 2010, initially mainly involving right V3 branch, lightening bolt sensation, is under the pain management at [facility], had some intervention in the past, SPG injection, C2-6 ablation, made her symptoms worse, she was eventually treated with polypharmacy, now taking extended release oxycodone  18 mg twice a day, Tegretol -XR 200 mg twice a day, her pain is under reasonable control  MRI of trigeminal and face with without contrast in November 2024 was  normal  She began to receive migraine over the past few years, every 3 months, did help her headache, she still has intermittent flareup, holoacranial headache with light noise sensitivity but shorter lasting, less frequent, Nurtec as needed was helpful   PHYSICAL EXAM:   Vitals:   06/30/24 1427  BP: 107/70   PHYSICAL EXAMNIATION:  Gen: NAD, conversant, well nourised, well groomed                     Cardiovascular: Regular rate rhythm, no peripheral edema, warm, nontender. Eyes: Conjunctivae clear without exudates or hemorrhage Neck: Supple, no carotid bruits. Pulmonary: Clear to auscultation bilaterally   NEUROLOGICAL EXAM:  MENTAL STATUS: Speech/cognition: Awake, alert, oriented to history taking and casual conversation CRANIAL NERVES: CN II: Visual fields are full to confrontation. Pupils are round equal and briskly reactive to light. CN III, IV, VI: extraocular movement are normal. No ptosis. CN V: Facial sensation is intact to light touch CN VII: Face is symmetric with normal eye closure  CN VIII: Hearing is normal to causal conversation. CN IX, X: Phonation is normal. CN XI: Head turning and shoulder shrug are intact  MOTOR: There is no pronator drift of out-stretched arms. Muscle bulk and tone are normal. Muscle strength is normal.  REFLEXES: Reflexes are 2+ and symmetric at the biceps, triceps, knees, and ankles. Plantar responses are flexor.  SENSORY: Intact to light touch, pinprick and vibratory sensation are intact in fingers and toes.  COORDINATION: There is no trunk or limb dysmetria noted.  GAIT/STANCE: Posture is normal. Gait is steady   REVIEW OF SYSTEMS:  Full 14 system review of systems performed and notable only for  as above All other review of systems were negative.   ALLERGIES: Allergies  Allergen Reactions   Topamax  [Topiramate ] Other (See Comments)    Tingling   Zonegran  [Zonisamide ] Itching    HOME MEDICATIONS: Current Outpatient  Medications  Medication Sig Dispense Refill   albuterol  (PROVENTIL ) (2.5 MG/3ML) 0.083% nebulizer solution Take 3 mLs (2.5 mg total) by nebulization every 6 (six) hours as needed for wheezing or shortness of breath. 75 mL 0   BOTOX  200 units injection INJECT 155 UNITS INTRAMUSCULARLY EVERY 3 MONTHS 1 each 3   carbamazepine  (TEGRETOL -XR) 200 MG 12 hr tablet Take 1 tablet (200 mg total) by mouth 2 (two) times daily. 60 tablet 3   cyclobenzaprine  (FLEXERIL ) 5 MG tablet TAKE 1 TABLET BY MOUTH EVERY 8 HOURS AS NEEDED FOR MUSCLE SPASMS 90 tablet 1   desvenlafaxine (PRISTIQ) 100 MG 24 hr tablet Take 100 mg by mouth daily.     Multiple Vitamin (MULTIVITAMIN WITH MINERALS) TABS tablet Take 1 tablet by mouth daily.     predniSONE  (DELTASONE ) 20 MG tablet Take 1 tablet (20 mg total) by mouth daily with breakfast. 5 tablet 0   Rimegepant Sulfate (NURTEC) 75 MG TBDP Take 75 mg by mouth daily as needed. For migraines. Take as close to onset of migraine as possible. One daily maximum. 8 tablet 5   topiramate  (TOPAMAX ) 50 MG tablet Take 50 mg by mouth 2 (two) times daily.     WEGOVY  1.7 MG/0.75ML SOAJ      Current Facility-Administered Medications  Medication Dose Route Frequency Provider Last Rate Last Admin   botulinum toxin Type A  (BOTOX ) injection 200 Units  200 Units Intramuscular Once Onita Duos, MD        PAST MEDICAL HISTORY: Past Medical History:  Diagnosis Date   Anxiety    Asthma    Hx with pregnancy only - No inhaler- no current problems   Bell's palsy    at age 54 & age 13, no current problems   Common migraine with intractable migraine 07/29/2016   Depression    Fibromyalgia    IBS (irritable bowel syndrome)    diet controlled   Neuromuscular disorder (HCC)    trigeminal nuralga - not currently active    PAST SURGICAL HISTORY: Past Surgical History:  Procedure Laterality Date   ABDOMINAL HYSTERECTOMY  2016   ABLATION     BREAST REDUCTION SURGERY Bilateral 08/12/2018   Procedure:  BILATERAL MAMMARY REDUCTION  (BREAST);  Surgeon: Lowery Estefana RAMAN, DO;  Location: [facility];  Service: Plastics;  Laterality: Bilateral;   ceserean section x 3     COLONOSCOPY     CYSTOSCOPY N/A 06/12/2015   Procedure: CYSTOSCOPY;  Surgeon: Burnard Bowers, MD;  Location: [facility];  Service: Gynecology;  Laterality: N/A;   DILATION AND CURETTAGE OF UTERUS     LAPAROSCOPIC LYSIS OF ADHESIONS  01/12/2018   Procedure: LAPAROSCOPIC LYSIS OF ADHESIONS;  Surgeon: Bowers Burnard, MD;  Location: [facility];  Service: Gynecology;;   LAPAROSCOPY N/A 01/12/2018   Procedure: Diagnostic Laparoscopy;  Surgeon: Bowers Burnard, MD;  Location: [facility];  Service: Gynecology;  Laterality: N/A;   REDUCTION MAMMAPLASTY Bilateral    ROBOTIC ASSISTED LAPAROSCOPIC LYSIS OF ADHESION  06/12/2015   Procedure: ROBOTIC ASSISTED LAPAROSCOPIC LYSIS OF ADHESION;  Surgeon: Burnard Bowers, MD;  Location: [facility];  Service: Gynecology;;   WISDOM TOOTH EXTRACTION      FAMILY HISTORY: Family History  Problem Relation Age of Onset   High blood pressure  Mother    Migraines Mother    High Cholesterol Father    Aortic stenosis Father    Arrhythmia Father        Pacemaker   Breast cancer Maternal Grandmother    Breast cancer Maternal Aunt    Lung cancer Maternal Aunt    Mental retardation Daughter     SOCIAL HISTORY: Social History   Socioeconomic History   Marital status: Married    Spouse name: Not on file   Number of children: 4   Years of education: Master's   Highest education level: Not on file  Occupational History   Occupation: Geophysicist/field Seismologist and Language  Tobacco Use   Smoking status: Former    Current packs/day: 0.00    Average packs/day: 0.3 packs/day for 20.0 years (5.0 ttl pk-yrs)    Types: Cigarettes    Start date: 08/26/1996    Quit date: 08/26/2016    Years since quitting: 7.8   Smokeless tobacco: Never  Vaping Use   Vaping status: Never Used  Substance and Sexual Activity    Alcohol use: Yes    Comment: occ   Drug use: No   Sexual activity: Yes    Birth control/protection: Surgical  Other Topics Concern   Not on file  Social History Narrative   Lives at home w/ her husband and children   Right-handed   Caffeine : 1-2 cups per day   Social Drivers of Health   Financial Resource Strain: Low Risk  (12/20/2023)   Received from [facility]   Overall Financial Resource Strain (CARDIA)    Difficulty of Paying Living Expenses: Not hard at all  Food Insecurity: No Food Insecurity (12/20/2023)   Received from [facility]   Hunger Vital Sign    Within the past 12 months, you worried that your food would run out before you got the money to buy more.: Never true    Within the past 12 months, the food you bought just didn't last and you didn't have money to get more.: Never true  Transportation Needs: No Transportation Needs (12/20/2023)   Received from [facility] - Transportation    Lack of Transportation (Medical): No    Lack of Transportation (Non-Medical): No  Physical Activity: Unknown (12/20/2023)   Received from [facility]   Exercise Vital Sign    On average, how many days per week do you engage in moderate to strenuous exercise (like a brisk walk)?: 0 days    Minutes of Exercise per Session: Not on file  Stress: Stress Concern Present (12/20/2023)   Received from [facility] - Occupational Stress Questionnaire    Feeling of Stress : Very much  Social Connections: Somewhat Isolated (12/20/2023)   Received from [facility]   Social Network    How would you rate your social network (family, work, friends)?: Restricted participation with some degree of social isolation  Intimate Partner Violence: Not At Risk (12/20/2023)   Received from [facility]   HITS    Over the last 12 months how often did your partner physically hurt you?: Never    Over the last 12 months how often did your partner  insult you or talk down to you?: Never    Over the last 12 months how often did your partner threaten you with physical harm?: Never    Over the last 12 months how often did your partner scream or curse at you?: Never  Hannie Shoe, M.D. Ph.D.  [facility] [address], [address] [place], [place] [address] Ph: [phone] Fax: [phone]  CC:  Cleotilde Planas, MD [address] [place],  [place] [address]  Cleotilde Planas, MD

## 2024-07-08 ENCOUNTER — Encounter (INDEPENDENT_AMBULATORY_CARE_PROVIDER_SITE_OTHER): Payer: Self-pay | Admitting: Neurology

## 2024-07-08 DIAGNOSIS — G43711 Chronic migraine without aura, intractable, with status migrainosus: Secondary | ICD-10-CM

## 2024-07-08 DIAGNOSIS — G500 Trigeminal neuralgia: Secondary | ICD-10-CM

## 2024-07-08 DIAGNOSIS — Z0289 Encounter for other administrative examinations: Secondary | ICD-10-CM

## 2024-07-12 NOTE — Telephone Encounter (Signed)
 Pt called  back informed  That she will get a call back

## 2024-07-12 NOTE — Telephone Encounter (Signed)
Called, left message for her to call back.  

## 2024-07-14 ENCOUNTER — Ambulatory Visit: Attending: Rheumatology | Admitting: Rheumatology

## 2024-07-14 ENCOUNTER — Encounter: Payer: Self-pay | Admitting: Rheumatology

## 2024-07-14 VITALS — BP 117/78 | HR 87 | Temp 98.0°F | Resp 14 | Ht 64.5 in | Wt 182.2 lb

## 2024-07-14 DIAGNOSIS — M542 Cervicalgia: Secondary | ICD-10-CM

## 2024-07-14 DIAGNOSIS — I7300 Raynaud's syndrome without gangrene: Secondary | ICD-10-CM

## 2024-07-14 DIAGNOSIS — Q6672 Congenital pes cavus, left foot: Secondary | ICD-10-CM

## 2024-07-14 DIAGNOSIS — M2550 Pain in unspecified joint: Secondary | ICD-10-CM | POA: Diagnosis not present

## 2024-07-14 DIAGNOSIS — E782 Mixed hyperlipidemia: Secondary | ICD-10-CM

## 2024-07-14 DIAGNOSIS — Z87891 Personal history of nicotine dependence: Secondary | ICD-10-CM

## 2024-07-14 DIAGNOSIS — M2520 Flail joint, unspecified joint: Secondary | ICD-10-CM

## 2024-07-14 DIAGNOSIS — G43711 Chronic migraine without aura, intractable, with status migrainosus: Secondary | ICD-10-CM

## 2024-07-14 DIAGNOSIS — E559 Vitamin D deficiency, unspecified: Secondary | ICD-10-CM

## 2024-07-14 DIAGNOSIS — R5383 Other fatigue: Secondary | ICD-10-CM

## 2024-07-14 DIAGNOSIS — F411 Generalized anxiety disorder: Secondary | ICD-10-CM

## 2024-07-14 DIAGNOSIS — K582 Mixed irritable bowel syndrome: Secondary | ICD-10-CM

## 2024-07-14 DIAGNOSIS — M791 Myalgia, unspecified site: Secondary | ICD-10-CM

## 2024-07-14 DIAGNOSIS — G500 Trigeminal neuralgia: Secondary | ICD-10-CM

## 2024-07-14 DIAGNOSIS — Q6671 Congenital pes cavus, right foot: Secondary | ICD-10-CM

## 2024-07-14 DIAGNOSIS — F5101 Primary insomnia: Secondary | ICD-10-CM

## 2024-07-14 DIAGNOSIS — F902 Attention-deficit hyperactivity disorder, combined type: Secondary | ICD-10-CM

## 2024-07-14 DIAGNOSIS — E7410 Disorder of fructose metabolism, unspecified: Secondary | ICD-10-CM

## 2024-07-14 DIAGNOSIS — F633 Trichotillomania: Secondary | ICD-10-CM

## 2024-07-15 MED ORDER — LAMOTRIGINE 25 MG PO TABS: ORAL_TABLET | ORAL | 0 refills | Status: DC

## 2024-07-15 NOTE — Addendum Note (Signed)
 Addended by: Aryona Sill on: 07/15/2024 11:55 AM   Modules accepted: Orders

## 2024-07-15 NOTE — Telephone Encounter (Signed)
 I called patient, she has recurrent shocking pain, and burning sensation involving right V3 territory, constant 100% of time  Previously tried  Lyrica- bad side effect, word finding difficulty, weight gain, 75mg  bid,   Gabapentin -400mg  tid. Similar side effect  Tegretol  xr 200mg  bid-similar side effect, not helping her pain.  Decide on Lamotrigine  25mg , titrating to 75mg  bid, call for progress report, may then increase to 100mg  bid.  Taper off tegretol   weeks Tegretol  ER 200g (am/pm) Lamotrigine  25mg   1st 0/1 1/1  2nd stop 2/2   3rd   3/3

## 2024-07-15 NOTE — Telephone Encounter (Signed)
Please see the MyChart message reply(ies) for my assessment and plan.    This patient gave consent for this Medical Advice Message and is aware that it may result in a bill to [employer], as well as the possibility of receiving a bill for a co-payment or deductible. They are an established patient, but are not seeking medical advice exclusively about a problem treated during an in person or video visit in the last seven days. I did not recommend an in person or video visit within seven days of my reply.    I spent a total of 12 minutes cumulative time within 7 days through [employer].  Levert Feinstein, MD

## 2024-07-18 ENCOUNTER — Ambulatory Visit: Payer: Self-pay | Admitting: Rheumatology

## 2024-07-18 LAB — CBC WITH DIFFERENTIAL/PLATELET
Absolute Lymphocytes: 1290 {cells}/uL (ref 850–3900)
Absolute Monocytes: 536 {cells}/uL (ref 200–950)
Basophils Absolute: 42 {cells}/uL (ref 0–200)
Basophils Relative: 0.8 %
Eosinophils Absolute: 109 {cells}/uL (ref 15–500)
Eosinophils Relative: 2.1 %
HCT: 42.9 % (ref 35.0–45.0)
Hemoglobin: 14.3 g/dL (ref 11.7–15.5)
MCH: 29.9 pg (ref 27.0–33.0)
MCHC: 33.3 g/dL (ref 32.0–36.0)
MCV: 89.6 fL (ref 80.0–100.0)
MPV: 9.9 fL (ref 7.5–12.5)
Monocytes Relative: 10.3 %
Neutro Abs: 3224 {cells}/uL (ref 1500–7800)
Neutrophils Relative %: 62 %
Platelets: 279 Thousand/uL (ref 140–400)
RBC: 4.79 Million/uL (ref 3.80–5.10)
RDW: 13.1 % (ref 11.0–15.0)
Total Lymphocyte: 24.8 %
WBC: 5.2 Thousand/uL (ref 3.8–10.8)

## 2024-07-18 LAB — COMPREHENSIVE METABOLIC PANEL WITH GFR
AG Ratio: 1.5 (calc) (ref 1.0–2.5)
ALT: 22 U/L (ref 6–29)
AST: 18 U/L (ref 10–35)
Albumin: 4.1 g/dL (ref 3.6–5.1)
Alkaline phosphatase (APISO): 75 U/L (ref 37–153)
BUN: 12 mg/dL (ref 7–25)
CO2: 29 mmol/L (ref 20–32)
Calcium: 8.8 mg/dL (ref 8.6–10.4)
Chloride: 103 mmol/L (ref 98–110)
Creat: 0.72 mg/dL (ref 0.50–1.03)
Globulin: 2.7 g/dL (ref 1.9–3.7)
Glucose, Bld: 91 mg/dL (ref 65–99)
Potassium: 4.4 mmol/L (ref 3.5–5.3)
Sodium: 139 mmol/L (ref 135–146)
Total Bilirubin: 0.2 mg/dL (ref 0.2–1.2)
Total Protein: 6.8 g/dL (ref 6.1–8.1)
eGFR: 101 mL/min/1.73m2 (ref >=60)

## 2024-07-18 LAB — RHEUMATOID FACTOR: Rheumatoid fact SerPl-aCnc: <10 [IU]/mL (ref <14)

## 2024-07-18 LAB — PROTEIN ELECTROPHORESIS, SERUM, WITH REFLEX
Albumin ELP: 4 g/dL (ref 3.8–4.8)
Alpha 1: 0.3 g/dL (ref 0.2–0.3)
Alpha 2: 0.7 g/dL (ref 0.5–0.9)
Beta 2: 0.4 g/dL (ref 0.2–0.5)
Beta Globulin: 0.4 g/dL (ref 0.4–0.6)
Gamma Globulin: 1.1 g/dL (ref 0.8–1.7)
Total Protein: 6.9 g/dL (ref 6.1–8.1)

## 2024-07-18 LAB — ANTI-SMITH ANTIBODY: ENA SM Ab Ser-aCnc: <1 AI

## 2024-07-18 LAB — SEDIMENTATION RATE: Sed Rate: 2 mm/h (ref 0–30)

## 2024-07-18 LAB — VITAMIN B12: Vitamin B-12: 883 pg/mL (ref 200–1100)

## 2024-07-18 LAB — ANTI-DNA ANTIBODY, DOUBLE-STRANDED: ds DNA Ab: <1 [IU]/mL

## 2024-07-18 LAB — CARDIOLIPIN ANTIBODIES, IGG, IGM, IGA
Anticardiolipin IgA: <2 [APL'U]/mL (ref <20.0)
Anticardiolipin IgG: <2 [GPL'U]/mL (ref <20.0)
Anticardiolipin IgM: <2 [MPL'U]/mL (ref <20.0)

## 2024-07-18 LAB — C3 AND C4
C3 Complement: 150 mg/dL (ref 83–193)
C4 Complement: 24 mg/dL (ref 15–57)

## 2024-07-18 LAB — BETA-2 GLYCOPROTEIN ANTIBODIES
Beta-2 Glyco 1 IgA: <2 U/mL (ref <20.0)
Beta-2 Glyco 1 IgM: <2 U/mL (ref <20.0)
Beta-2 Glyco I IgG: <2 U/mL (ref <20.0)

## 2024-07-18 LAB — SJOGRENS SYNDROME-A EXTRACTABLE NUCLEAR ANTIBODY: SSA (Ro) (ENA) Antibody, IgG: <1 AI

## 2024-07-18 LAB — ANTI-SCLERODERMA ANTIBODY: Scleroderma (Scl-70) (ENA) Antibody, IgG: <1 AI

## 2024-07-18 LAB — ANA: Anti Nuclear Antibody (ANA): NEGATIVE

## 2024-07-18 LAB — RNP ANTIBODY: Ribonucleic Protein(ENA) Antibody, IgG: <1 AI

## 2024-07-18 LAB — VITAMIN D 25 HYDROXY (VIT D DEFICIENCY, FRACTURES): Vit D, 25-Hydroxy: 61 ng/mL (ref 30–100)

## 2024-07-18 LAB — CYCLIC CITRUL PEPTIDE ANTIBODY, IGG: Cyclic Citrullin Peptide Ab: <16 U

## 2024-07-18 LAB — CK: Total CK: 87 U/L (ref 21–240)

## 2024-07-18 LAB — SJOGRENS SYNDROME-B EXTRACTABLE NUCLEAR ANTIBODY: SSB (La) (ENA) Antibody, IgG: <1 AI

## 2024-07-19 ENCOUNTER — Telehealth: Payer: Self-pay | Admitting: Pharmacist

## 2024-07-19 NOTE — Telephone Encounter (Signed)
 Pharmacy Patient Advocate Encounter  Received notification from [facility] that Prior Authorization for NURTEC 75 MG PO TBDP has been APPROVED from 07/19/2024 to 07/19/2025   PA #/Case ID/Reference #: [id number]

## 2024-07-19 NOTE — Telephone Encounter (Signed)
 Pharmacy Patient Advocate Encounter   Received notification from Patient Pharmacy that prior authorization for Nurtec 75MG  dispersible tablets is required/requested.   Insurance verification completed.   The patient is insured through [facility].   Per test claim: PA required; PA submitted to above mentioned insurance via Latent Key/confirmation #/EOC [id number] Status is pending

## 2024-07-28 ENCOUNTER — Emergency Department (HOSPITAL_BASED_OUTPATIENT_CLINIC_OR_DEPARTMENT_OTHER)

## 2024-07-28 ENCOUNTER — Other Ambulatory Visit: Payer: Self-pay

## 2024-07-28 ENCOUNTER — Emergency Department (HOSPITAL_BASED_OUTPATIENT_CLINIC_OR_DEPARTMENT_OTHER)
Admission: EM | Admit: 2024-07-28 | Discharge: 2024-07-28 | Disposition: A | Discharge status: Home / Self Care | Attending: Emergency Medicine | Admitting: Emergency Medicine

## 2024-07-28 ENCOUNTER — Encounter (HOSPITAL_BASED_OUTPATIENT_CLINIC_OR_DEPARTMENT_OTHER): Payer: Self-pay

## 2024-07-28 ENCOUNTER — Encounter: Payer: Self-pay | Admitting: Neurology

## 2024-07-28 DIAGNOSIS — R0789 Other chest pain: Secondary | ICD-10-CM | POA: Diagnosis present

## 2024-07-28 DIAGNOSIS — R079 Chest pain, unspecified: Secondary | ICD-10-CM

## 2024-07-28 HISTORY — DX: Trigeminal neuralgia: G50.0

## 2024-07-28 LAB — CBC
HCT: 41.2 % (ref 36.0–46.0)
Hemoglobin: 14.1 g/dL (ref 12.0–15.0)
MCH: 29.6 pg (ref 26.0–34.0)
MCHC: 34.2 g/dL (ref 30.0–36.0)
MCV: 86.6 fL (ref 80.0–100.0)
Platelets: 278 K/uL (ref 150–400)
RBC: 4.76 MIL/uL (ref 3.87–5.11)
RDW: 13 % (ref 11.5–15.5)
WBC: 7.9 K/uL (ref 4.0–10.5)
nRBC: 0 % (ref 0.0–0.2)

## 2024-07-28 LAB — BASIC METABOLIC PANEL WITH GFR
Anion gap: 10 (ref 5–15)
BUN: 12 mg/dL (ref 6–20)
CO2: 26 mmol/L (ref 22–32)
Calcium: 8.9 mg/dL (ref 8.9–10.3)
Chloride: 105 mmol/L (ref 98–111)
Creatinine, Ser: 0.73 mg/dL (ref 0.44–1.00)
GFR, Estimated: >60 mL/min (ref >60)
Glucose, Bld: 95 mg/dL (ref 70–99)
Potassium: 3.8 mmol/L (ref 3.5–5.1)
Sodium: 140 mmol/L (ref 135–145)

## 2024-07-28 LAB — TROPONIN T, HIGH SENSITIVITY
Troponin T High Sensitivity: <15 ng/L (ref 0–19)
Troponin T High Sensitivity: <15 ng/L (ref 0–19)

## 2024-07-28 NOTE — ED Triage Notes (Signed)
 Reports R sided chest pain around 1530 today. States has moved to L side of chest since.   Denies SHOB or NV

## 2024-07-28 NOTE — ED Notes (Signed)
 Pt. Reports she was placed on a new med and poss. Thinks it was caused by this.

## 2024-07-28 NOTE — Discharge Instructions (Addendum)
 As we discussed, your labs, EKG, CXR are all reassuring. Please follow up with Dr. Erin for her input regarding the lamictal . A referral to cardiology has also been provided for further outpatient evaluation of chest pain.   Return to the ED with any new or concerning symptoms at any time.

## 2024-07-28 NOTE — ED Notes (Signed)
 ED Provider at bedside.

## 2024-07-28 NOTE — ED Provider Notes (Signed)
 [place] [facility] Provider Note   CSN: [id number] Arrival date & time: 07/28/24  1647     Patient presents with: Chest Pain   Shari Booth is a 53 y.o. female.   Patient to ED for evaluation of chest pain that started around 3:30 this afternoon in the right chest described as sharp and without modifying factors. It then moved into the left chest before resolving. No SOB, nausea, vomiting. No history of heart disease. No recent illness, cough, fever. The pain was non-radiating.   The history is provided by the patient. No language interpreter was used.  Chest Pain      Prior to Admission medications   Medication Sig Start Date End Date Taking? Authorizing Provider  albuterol  (PROVENTIL ) (2.5 MG/3ML) 0.083% nebulizer solution Take 3 mLs (2.5 mg total) by nebulization every 6 (six) hours as needed for wheezing or shortness of breath. 05/13/24   Maranda Jamee Jacob, MD  BOTOX  200 units injection INJECT 155 UNITS INTRAMUSCULARLY EVERY 3 MONTHS 08/26/23   Ines Onetha KATHEE, MD  carbamazepine  (TEGRETOL -XR) 200 MG 12 hr tablet Take 1 tablet (200 mg total) by mouth 2 (two) times daily. 04/07/24   Ines Onetha KATHEE, MD  clonazePAM  (KLONOPIN ) 0.5 MG tablet 1 tablet Orally Once a day As needed 01/13/23   [provider]  cyclobenzaprine  (FLEXERIL ) 5 MG tablet Take 1 tablet (5 mg total) by mouth every 8 (eight) hours as needed for muscle spasms. 06/30/24   Onita Duos, MD  desvenlafaxine (PRISTIQ) 100 MG 24 hr tablet Take 200 mg by mouth daily.    [provider]  Erenumab -aooe (AIMOVIG ) 70 MG/ML SOAJ Inject 70 mg into the skin every 30 (thirty) days. Patient not taking: Reported on 07/14/2024 06/30/24   Onita Duos, MD  lamoTRIgine  (LAMICTAL ) 25 MG tablet One bid x1 week, 2 bid x2nd week, 3 bid 07/15/24   Onita Duos, MD  Multiple Vitamin (MULTIVITAMIN WITH MINERALS) TABS tablet Take 1 tablet by mouth daily.    [provider]   oxyCODONE  ER (XTAMPZA  ER) 18 MG C12A Take by mouth. bid    [provider]  Rimegepant Sulfate (NURTEC) 75 MG TBDP Take 1 tablet (75 mg total) by mouth daily as needed. For migraines. Take as close to onset of migraine as possible. One daily maximum. 06/30/24   Onita Duos, MD  WEGOVY  1.7 MG/0.75ML SOAJ  04/29/23   [provider]    Allergies: Topamax  [topiramate ] and Zonegran  [zonisamide ]    Review of Systems  Cardiovascular:  Positive for chest pain.    Updated Vital Signs BP 122/80 (BP Location: Right Arm)   Pulse 86   Temp 99.7 F (37.6 C)   Resp 17   SpO2 95%   Physical Exam Vitals and nursing note reviewed.  Constitutional:      Appearance: She is well-developed.  HENT:     Head: Normocephalic.  Eyes:     Conjunctiva/sclera: Conjunctivae normal.  Cardiovascular:     Rate and Rhythm: Normal rate and regular rhythm.     Heart sounds: No murmur heard. Pulmonary:     Effort: Pulmonary effort is normal.     Breath sounds: Normal breath sounds. No wheezing, rhonchi or rales.  Chest:     Chest wall: No tenderness.  Abdominal:     General: Bowel sounds are normal.     Palpations: Abdomen is soft.     Tenderness: There is no abdominal tenderness. There is no guarding  or rebound.  Musculoskeletal:        General: Normal range of motion.     Cervical back: Normal range of motion and neck supple.     Right lower leg: No edema.     Left lower leg: No edema.  Skin:    General: Skin is warm and dry.  Neurological:     General: No focal deficit present.     Mental Status: She is alert and oriented to person, place, and time.     (all labs ordered are listed, but only abnormal results are displayed) Labs Reviewed  BASIC METABOLIC PANEL WITH GFR  CBC  TROPONIN T, HIGH SENSITIVITY  TROPONIN T, HIGH SENSITIVITY    EKG: EKG Interpretation Date/Time:  Wednesday July 28 2024 16:54:51 EST Ventricular Rate:  82 PR Interval:  165 QRS Duration:  92 QT  Interval:  354 QTC Calculation: 414 R Axis:   53  Text Interpretation: Sinus rhythm since last tracing no significant change Confirmed by Lenor Hollering [phone]) on 07/28/2024 7:52:01 PM  Radiology: DG Chest 2 View Result Date: 07/28/2024 EXAM: 2 VIEW(S) XRAY OF THE CHEST 07/28/2024 05:15:05 PM COMPARISON: 04/29/2024 CLINICAL HISTORY: chest pain FINDINGS: LUNGS AND PLEURA: No focal pulmonary opacity. No pleural effusion. No pneumothorax. HEART AND MEDIASTINUM: No acute abnormality of the cardiac and mediastinal silhouettes. BONES AND SOFT TISSUES: No acute osseous abnormality. IMPRESSION: 1. No acute cardiopulmonary process. Electronically signed by: Lynwood Seip MD 07/28/2024 05:58 PM EST RP Workstation: [id number]     Procedures   Medications Ordered in the ED - No data to display  Clinical Course as of 07/28/24 2042  Wed Jul 28, 2024  1946 Patient to ED for evaluation of chest pain, starting at 3:30 while at rest in the right chest, moved into the left and resolved. She continues to be pain free in the ED. Labs reviewed and are unremarkable including troponin of <15.  [SU]  2038 EKG [SU]  2038 EKG: EKG Interpretation Date/Time:  Wednesday July 28 2024 16:54:51 EST Ventricular Rate:  82 PR Interval:  165 QRS Duration:  92 QT Interval:  354 QTC Calculation: 414 R Axis:   53  Text Interpretation: Sinus rhythm since last tracing no significant change Confirmed by Lenor Hollering [phone]) on 07/28/2024 7:52:01 PM  Delta troponin obtained and is negative. Patient is stable for discharge. Will refer to cardiology. She also plans to discuss the possibility of her symptoms being a side effect of lamotrigine  with Dr. Erin. Patient is comfortable with plan of discharge to outpatient follow up. [SU]    Clinical Course User Index [SU] Odell Balls, PA-C                                 Medical Decision Making Amount and/or Complexity of Data Reviewed Labs: ordered. Radiology:  ordered.        Final diagnoses:  Nonspecific chest pain    ED Discharge Orders     None          Odell Balls, DEVONNA 07/28/24 2042    Lenor Hollering, MD 07/28/24 2320

## 2024-07-28 NOTE — ED Notes (Signed)
 No chest pain

## 2024-07-29 ENCOUNTER — Telehealth: Admitting: Neurology

## 2024-07-29 DIAGNOSIS — G500 Trigeminal neuralgia: Secondary | ICD-10-CM

## 2024-07-29 DIAGNOSIS — G43709 Chronic migraine without aura, not intractable, without status migrainosus: Secondary | ICD-10-CM

## 2024-07-29 DIAGNOSIS — G43711 Chronic migraine without aura, intractable, with status migrainosus: Secondary | ICD-10-CM

## 2024-07-29 MED ORDER — LAMOTRIGINE 25 MG PO TABS: 100.0000 mg | ORAL_TABLET | Freq: Two times a day (BID) | ORAL | 3 refills | Status: DC

## 2024-07-29 NOTE — Telephone Encounter (Signed)
 From ER visit:      Clinical Course as of 07/28/24 2042  Wed Jul 28, 2024  1946 Patient to ED for evaluation of chest pain, starting at 3:30 while at rest in the right chest, moved into the left and resolved. She continues to be pain free in the ED. Labs reviewed and are unremarkable including troponin of <15.  [SU]  2038 EKG [SU]  2038 EKG: EKG Interpretation Date/Time:            Wednesday July 28 2024 16:54:51 EST Ventricular Rate:   82 PR Interval:                165 QRS Duration:              92 QT Interval:                354 QTC Calculation:414 R Axis:               53   Text Interpretation:Sinus rhythm since last tracing no significant change Confirmed by Lenor Hollering [phone]) on 07/28/2024 7:52:01 PM   Delta troponin obtained and is negative. Patient is stable for discharge. Will refer to cardiology. She also plans to discuss the possibility of her symptoms being a side effect of lamotrigine  with Dr. Yin. Patient is comfortable with plan of discharge to outpatient follow up. [SU]

## 2024-07-29 NOTE — Telephone Encounter (Signed)
 Pt has called explaining information she included in my chart from 12/3 she went to ED for chest pain, she wants to discuss the lamoTRIgine  (LAMICTAL ) 25 MG tablet she plans to f/u with Cardiologist as well.  If pt is not available she is asking that her husband(on DPR) is called at [phone]

## 2024-07-29 NOTE — Progress Notes (Signed)
 ASSESSMENT AND PLAN  Shari Booth is a 53 y.o. female  Right trigeminal neuralgia   oxycodone  ER 18 mg twice a day, reported flareup of right jaw pain in November, tapered off Tegretol , on titrating dose of lamotrigine , advised her slow titrating to 100 mg twice a day,  She also had a second opinion by [facility] Dr. Rosine July Booth suggest gamma knife radiosurgery, she is hesitant to go through the procedure  Her transient chest pain on December 3, Booth was evaluated at emergency room, negative troponin and normal EKG less likely related to lamotrigine  use  Chronic migraine headaches  Botox  injection for chronic migraine prevention, injection was performed according to [place] protocol, last injection was on November 5, Booth, she complains of recurrent right jaw pain since last injection, is questioning whether she should continue Botox  injection, other option would be CGRP antagonist   DIAGNOSTIC DATA (LABS, IMAGING, TESTING) - I reviewed patient records, labs, notes, testing and imaging myself where available.   MEDICAL HISTORY:  Shari Booth is a 53 year old female, patient Dr. Ines, return for Botox  injection as migraine prevention, his primary care physician is Dr. Cleotilde, Shari Booth,   History is obtained from the patient and review of electronic medical records. I personally reviewed pertinent available imaging films in PACS.   PMHx of  Depression Right Trigeminal neuralgia.  She had a history of right trigeminal neuralgia since 2010, initially mainly involving right V3 branch, lightening bolt sensation, is under the pain management at [facility], had some intervention in the past, SPG injection, C2-6 ablation, made her symptoms worse, she was eventually treated with polypharmacy, now taking extended release oxycodone  18 mg twice a day, Tegretol -XR 200 mg twice a day, her pain is under reasonable control  MRI of trigeminal and face with without contrast in November 2024 was  normal  She began to receive migraine over the past few years, every 3 months, did help her headache, she still has intermittent flareup, holoacranial headache with light noise sensitivity but shorter lasting, less frequent, Nurtec as needed was helpful    Virtual Visit via video UPDATE Dec 4th Booth I discussed the limitations of evaluation and management by telemedicine and the availability of in person appointments. The patient expressed understanding and agreed to proceed  Location: Provider: [facility] office; Patient: Home  I connected with Shari Booth  on Dec 4th Booth by a video enabled telemedicine application and verified that I am speaking with the correct person using two identifiers.  UPDATED HiSTORY She has been on Lamotrigine  25mg  bid since Nov 20th, did not notice any difference of her right facial pain, she has stopped taking Tegretol , but did not titrating up lamotrigine  dose as she was instructed, she is also on Oxycodone  ER 18mg  bid by pain management Dr. Eichman,  flexeril  5mg  tid,   pristiqu 100mg  2 tabs daily,  For her right facial pain, previously tried Lyrica- bad side effect, word finding difficulty, weight gain, 75mg  bid,    Gabapentin -400mg  tid. Similar side effect    Trileptal, Tegretol  xr 200mg  bid-similar side effect, not helping her pain.  She also had a second opinion by [facility] Dr. Rosine July Booth suggest gamma knife radiosurgery, she is hesitant to go through the procedure  She presented to emergency room December 3, Booth for sudden onset of chest pain, troponin was negative, EKG showed no changes  She searched through Internet, worried that might be caused by lamotrigine , complains of 8 out of 10  right lower jaw pain, right neck muscle tension,   Observations/Objective: I have reviewed problem lists, medications, allergies. Awake, alert, oriented to history taking and casual conversation, facial symmetric, not in acute distress   REVIEW OF SYSTEMS:   Full 14 system review of systems performed and notable only for as above All other review of systems were negative.   ALLERGIES: Allergies  Allergen Reactions   Topamax  [Topiramate ] Other (See Comments)    Tingling   Zonegran  [Zonisamide ] Itching    HOME MEDICATIONS: Current Outpatient Medications  Medication Sig Dispense Refill   albuterol  (PROVENTIL ) (2.5 MG/3ML) 0.083% nebulizer solution Take 3 mLs (2.5 mg total) by nebulization every 6 (six) hours as needed for wheezing or shortness of breath. 75 mL 0   BOTOX  200 units injection INJECT 155 UNITS INTRAMUSCULARLY EVERY 3 MONTHS 1 each 3   carbamazepine  (TEGRETOL -XR) 200 MG 12 hr tablet Take 1 tablet (200 mg total) by mouth 2 (two) times daily. 60 tablet 3   clonazePAM  (KLONOPIN ) 0.5 MG tablet 1 tablet Orally Once a day As needed     cyclobenzaprine  (FLEXERIL ) 5 MG tablet Take 1 tablet (5 mg total) by mouth every 8 (eight) hours as needed for muscle spasms. 90 tablet 11   desvenlafaxine (PRISTIQ) 100 MG 24 hr tablet Take 200 mg by mouth daily.     Erenumab -aooe (AIMOVIG ) 70 MG/ML SOAJ Inject 70 mg into the skin every 30 (thirty) days. (Patient not taking: Reported on 11/19/Booth) 1.12 mL 11   lamoTRIgine  (LAMICTAL ) 25 MG tablet One bid x1 week, 2 bid x2nd week, 3 bid 84 tablet 0   Multiple Vitamin (MULTIVITAMIN WITH MINERALS) TABS tablet Take 1 tablet by mouth daily.     oxyCODONE  ER (XTAMPZA  ER) 18 MG C12A Take by mouth. bid     Rimegepant Sulfate (NURTEC) 75 MG TBDP Take 1 tablet (75 mg total) by mouth daily as needed. For migraines. Take as close to onset of migraine as possible. One daily maximum. 9 tablet 5   WEGOVY  1.7 MG/0.75ML SOAJ      Current Facility-Administered Medications  Medication Dose Route Frequency Provider Last Rate Last Admin   botulinum toxin Type A  (BOTOX ) injection 200 Units  200 Units Intramuscular Once Onita Duos, MD        PAST MEDICAL HISTORY: Past Medical History:  Diagnosis Date   Anxiety     Asthma    Hx with pregnancy only - No inhaler- no current problems   Bell's palsy    at age 76 & age 79, no current problems   Common migraine with intractable migraine 07/29/2016   Depression    Fibromyalgia    IBS (irritable bowel syndrome)    diet controlled   Neuromuscular disorder (HCC)    trigeminal nuralga - not currently active   Trigeminal neuralgia     PAST SURGICAL HISTORY: Past Surgical History:  Procedure Laterality Date   ABDOMINAL HYSTERECTOMY  2016   ABLATION     BREAST REDUCTION SURGERY Bilateral 08/12/2018   Procedure: BILATERAL MAMMARY REDUCTION  (BREAST);  Surgeon: Lowery Estefana RAMAN, DO;  Location: [facility];  Service: Plastics;  Laterality: Bilateral;   ceserean section x 3     COLONOSCOPY     CYSTOSCOPY N/A 06/12/2015   Procedure: CYSTOSCOPY;  Surgeon: Burnard Bowers, MD;  Location: [facility];  Service: Gynecology;  Laterality: N/A;   DILATION AND CURETTAGE OF UTERUS     LAPAROSCOPIC LYSIS OF ADHESIONS  01/12/2018   Procedure: LAPAROSCOPIC LYSIS  OF ADHESIONS;  Surgeon: Kandyce Sor, MD;  Location: [facility];  Service: Gynecology;;   LAPAROSCOPY N/A 01/12/2018   Procedure: Diagnostic Laparoscopy;  Surgeon: Kandyce Sor, MD;  Location: [facility];  Service: Gynecology;  Laterality: N/A;   REDUCTION MAMMAPLASTY Bilateral    ROBOTIC ASSISTED LAPAROSCOPIC LYSIS OF ADHESION  06/12/2015   Procedure: ROBOTIC ASSISTED LAPAROSCOPIC LYSIS OF ADHESION;  Surgeon: Sor Kandyce, MD;  Location: [facility];  Service: Gynecology;;   si joint fusion Left 2019   WISDOM TOOTH EXTRACTION      FAMILY HISTORY: Family History  Problem Relation Age of Onset   High blood pressure Mother    Migraines Mother    Anxiety disorder Mother    Kidney disease Mother    High Cholesterol Father    Aortic stenosis Father    Arrhythmia Father        Pacemaker   Obesity Father    Prostate cancer Brother    Anxiety disorder Brother    Depression Brother    Breast cancer  Maternal Aunt    Lung cancer Maternal Aunt    Breast cancer Maternal Grandmother    Fibromyalgia Daughter    Colitis Daughter    Anxiety disorder Daughter    Anxiety disorder Daughter    Gallbladder disease Daughter        removed   Depression Daughter     SOCIAL HISTORY: Social History   Socioeconomic History   Marital status: Married    Spouse name: Not on file   Number of children: 4   Years of education: Master's   Highest education level: Not on file  Occupational History   Occupation: Geophysicist/field Seismologist and Language  Tobacco Use   Smoking status: Former    Current packs/day: 0.00    Average packs/day: 0.3 packs/day for 20.0 years (5.0 ttl pk-yrs)    Types: Cigarettes    Start date: 08/26/1996    Quit date: 08/26/2016    Years since quitting: 7.9    Passive exposure: Never   Smokeless tobacco: Never  Vaping Use   Vaping status: Never Used  Substance and Sexual Activity   Alcohol use: Yes    Comment: occ   Drug use: No   Sexual activity: Yes    Birth control/protection: Surgical  Other Topics Concern   Not on file  Social History Narrative   Lives at home w/ her husband and children   Right-handed   Caffeine : 1-2 cups per day   Social Drivers of Health   Financial Resource Strain: Low Risk  (4/26/Booth)   Received from [facility]   Overall Financial Resource Strain (CARDIA)    Difficulty of Paying Living Expenses: Not hard at all  Food Insecurity: No Food Insecurity (4/26/Booth)   [facility]   Hunger Vital Sign    Within the past 12 months, you worried that your food would run out before you got the money to buy more.: Never true    Within the past 12 months, the food you bought just didn't last and you didn't have money to get more.: Never true  Transportation Needs: No Transportation Needs (4/26/Booth)   Received from [facility] - Transportation    Lack of Transportation (Medical): No    Lack of Transportation  (Non-Medical): No  Physical Activity: Unknown (4/26/Booth)   Received from [facility]   Exercise Vital Sign    On average, how many days per week do you engage in moderate  to strenuous exercise (like a brisk walk)?: 0 days    Minutes of Exercise per Session: Not on file  Stress: Stress Concern Present (4/26/Booth)   Received from [facility] - Occupational Stress Questionnaire    Feeling of Stress : Very much  Social Connections: Somewhat Isolated (4/26/Booth)   Received from [facility]   Social Network    How would you rate your social network (family, work, friends)?: Restricted participation with some degree of social isolation  Intimate Partner Violence: Not At Risk (4/26/Booth)   Received from [facility]   HITS    Over the last 12 months how often did your partner physically hurt you?: Never    Over the last 12 months how often did your partner insult you or talk down to you?: Never    Over the last 12 months how often did your partner threaten you with physical harm?: Never    Over the last 12 months how often did your partner scream or curse at you?: Never      Modena Callander, M.D. Ph.D.  [facility] [address] [place], [place] [address] Ph: [phone] Fax: [phone]  CC:  Shari Booth Planas, MD [address] [place],  [place] [address]  Shari Booth Planas, MD

## 2024-07-30 ENCOUNTER — Telehealth: Payer: Self-pay

## 2024-07-30 ENCOUNTER — Other Ambulatory Visit (HOSPITAL_COMMUNITY): Payer: Self-pay

## 2024-07-30 NOTE — Telephone Encounter (Signed)
 Pharmacy Patient Advocate Encounter  Received notification from [employer] that Prior Authorization for Aimovig  has been APPROVED from 07/30/2024 to 07/30/2025. Ran test claim, Copay is $0. This test claim was processed through [facility]- copay amounts may vary at other pharmacies due to pharmacy/plan contracts, or as the patient moves through the different stages of their insurance plan.   PA #/Case ID/Reference #: [id number]

## 2024-07-30 NOTE — Telephone Encounter (Signed)
 Pharmacy Patient Advocate Encounter   Received notification from Patient Advice Request messages that prior authorization for Aimovig  is required/requested.   Insurance verification completed.   The patient is insured through [facility].   Per test claim: PA required; PA submitted to above mentioned insurance via Latent Key/confirmation #/EOC [id number] Status is pending

## 2024-08-01 NOTE — Progress Notes (Unsigned)
 "  Office Visit Note  Patient: Shari Booth             Date of Birth: Oct 28, 1970           MRN: [id number]             PCP: Cleotilde Planas, MD Referring: Cleotilde Planas, MD Visit Date: 08/13/2024 Occupation: Data Unavailable  Subjective:  Generalized pain   History of Present Illness: Shari Booth is a 53 y.o. female with polyarthralgia and fatigue.  She returns today after her initial visit on July 14, 2024.  She states she continues to have generalized pain and discomfort.  She gives history of morning stiffness, discomfort climbing stairs and doing routine activities.  She gives history of dry mouth, arthralgias and myalgias.  She notices Raynaud's phenomenon and photosensitivity.  There is no history of oral ulcers, nasal ulcers, malar rash, inflammatory arthritis.    Activities of Daily Living:  Patient reports morning stiffness for 30-60 minutes.   Patient Reports nocturnal pain.  Difficulty dressing/grooming: Denies Difficulty climbing stairs: Reports Difficulty getting out of chair: Denies Difficulty using hands for taps, buttons, cutlery, and/or writing: Denies  Review of Systems  Constitutional:  Positive for fatigue.  HENT:  Positive for mouth dryness. Negative for mouth sores.   Eyes:  Negative for dryness.  Respiratory:  Positive for shortness of breath.   Cardiovascular:  Negative for chest pain and palpitations.  Gastrointestinal:  Positive for constipation and diarrhea. Negative for blood in stool.  Endocrine: Positive for increased urination.  Genitourinary:  Negative for involuntary urination.  Musculoskeletal:  Positive for joint pain, joint pain, myalgias, muscle weakness, morning stiffness, muscle tenderness and myalgias. Negative for gait problem and joint swelling.  Skin:  Positive for color change and sensitivity to sunlight. Negative for rash and hair loss.  Allergic/Immunologic: Negative for susceptible to infections.  Neurological:  Positive  for dizziness and headaches.  Hematological:  Negative for swollen glands.  Psychiatric/Behavioral:  Positive for depressed mood and sleep disturbance. The patient is nervous/anxious.     PMFS History:  Patient Active Problem List   Diagnosis Date Noted   Fructose intolerance 06/30/2024   Primary insomnia 06/30/2024   Mixed hyperlipidemia 06/30/2024   Trigeminal neuralgia of right side of face 04/29/2024   Back pain 07/10/2018   Neck pain 07/10/2018   Symptomatic mammary hypertrophy 07/10/2018   Chronic migraine without aura, with intractable migraine, so stated, with status migrainosus 07/29/2016   S/P laparoscopic hysterectomy 06/12/2015    Past Medical History:  Diagnosis Date   Anxiety    Asthma    Hx with pregnancy only - No inhaler- no current problems   Bell's palsy    at age 10 & age 73, no current problems   Common migraine with intractable migraine 07/29/2016   Depression    Fibromyalgia    IBS (irritable bowel syndrome)    diet controlled   Neuromuscular disorder (HCC)    trigeminal nuralga - not currently active   Trigeminal neuralgia     Family History  Problem Relation Age of Onset   High blood pressure Mother    Migraines Mother    Anxiety disorder Mother    Kidney disease Mother    High Cholesterol Father    Aortic stenosis Father    Arrhythmia Father        Pacemaker   Obesity Father    Prostate cancer Brother    Anxiety disorder Brother    Depression Brother  Breast cancer Maternal Aunt    Lung cancer Maternal Aunt    Breast cancer Maternal Grandmother    Fibromyalgia Daughter    Colitis Daughter    Anxiety disorder Daughter    Anxiety disorder Daughter    Gallbladder disease Daughter        removed   Depression Daughter    Past Surgical History:  Procedure Laterality Date   ABDOMINAL HYSTERECTOMY  2016   ABLATION     BREAST REDUCTION SURGERY Bilateral 08/12/2018   Procedure: BILATERAL MAMMARY REDUCTION  (BREAST);  Surgeon:  Lowery Estefana RAMAN, DO;  Location: [facility];  Service: Plastics;  Laterality: Bilateral;   ceserean section x 3     COLONOSCOPY     CYSTOSCOPY N/A 06/12/2015   Procedure: CYSTOSCOPY;  Surgeon: Burnard Bowers, MD;  Location: [facility];  Service: Gynecology;  Laterality: N/A;   DILATION AND CURETTAGE OF UTERUS     LAPAROSCOPIC LYSIS OF ADHESIONS  01/12/2018   Procedure: LAPAROSCOPIC LYSIS OF ADHESIONS;  Surgeon: Bowers Burnard, MD;  Location: [facility];  Service: Gynecology;;   LAPAROSCOPY N/A 01/12/2018   Procedure: Diagnostic Laparoscopy;  Surgeon: Bowers Burnard, MD;  Location: [facility];  Service: Gynecology;  Laterality: N/A;   REDUCTION MAMMAPLASTY Bilateral    ROBOTIC ASSISTED LAPAROSCOPIC LYSIS OF ADHESION  06/12/2015   Procedure: ROBOTIC ASSISTED LAPAROSCOPIC LYSIS OF ADHESION;  Surgeon: Burnard Bowers, MD;  Location: [facility];  Service: Gynecology;;   si joint fusion Left 2019   WISDOM TOOTH EXTRACTION     Social History   Tobacco Use   Smoking status: Former    Current packs/day: 0.00    Average packs/day: 0.3 packs/day for 20.0 years (5.0 ttl pk-yrs)    Types: Cigarettes    Start date: 08/26/1996    Quit date: 08/26/2016    Years since quitting: 7.9    Passive exposure: Never   Smokeless tobacco: Never  Vaping Use   Vaping status: Never Used  Substance Use Topics   Alcohol use: Yes    Comment: occ   Drug use: No   Social History   Social History Narrative   Lives at home w/ her husband and children   Right-handed   Caffeine : 1-2 cups per day      There is no immunization history on file for this patient.   Objective: Vital Signs: BP 95/63 (BP Location: Right Arm, Patient Position: Sitting, Cuff Size: Normal)   Pulse 86   Temp (!) 97.4 F (36.3 C)   Resp 14   Ht 5' 4 (1.626 m)   Wt 179 lb 12.8 oz (81.6 kg)   BMI 30.86 kg/m    Physical Exam Vitals and nursing note reviewed.  Constitutional:      Appearance: She is well-developed.  HENT:      Head: Normocephalic and atraumatic.  Eyes:     Conjunctiva/sclera: Conjunctivae normal.  Cardiovascular:     Rate and Rhythm: Normal rate and regular rhythm.     Heart sounds: Normal heart sounds.  Pulmonary:     Effort: Pulmonary effort is normal.     Breath sounds: Normal breath sounds.  Abdominal:     General: Bowel sounds are normal.     Palpations: Abdomen is soft.  Musculoskeletal:     Cervical back: Normal range of motion.  Lymphadenopathy:     Cervical: No cervical adenopathy.  Skin:    General: Skin is warm and dry.     Capillary Refill: Capillary refill takes  less than 2 seconds.  Neurological:     Mental Status: She is alert and oriented to person, place, and time.  Psychiatric:        Behavior: Behavior normal.      Musculoskeletal Exam: Cervical, thoracic and lumbar spine were in good range of motion.  There was no SI joint tenderness.  Shoulder joints, elbow joints, wrist joints, MCPs, PIPs and DIPs were in good range of motion with no synovitis.  Hip joints and knee joints were in good range of motion without any warmth swelling or effusion.  There was no tenderness over ankles or MTPs.  Hypermobility was noted in all of her joints.  She had bilateral trapezius spasm.  Generalized hyperalgesia was noted.   CDAI Exam: CDAI Score: -- Patient Global: --; Provider Global: -- Swollen: --; Tender: -- Joint Exam 08/13/2024   No joint exam has been documented for this visit   There is currently no information documented on the homunculus. Go to the Rheumatology activity and complete the homunculus joint exam.  Investigation: No additional findings.  Imaging: DG Chest 2 View Result Date: 07/28/2024 EXAM: 2 VIEW(S) XRAY OF THE CHEST 07/28/2024 05:15:05 PM COMPARISON: 04/29/2024 CLINICAL HISTORY: chest pain FINDINGS: LUNGS AND PLEURA: No focal pulmonary opacity. No pleural effusion. No pneumothorax. HEART AND MEDIASTINUM: No acute abnormality of the cardiac and  mediastinal silhouettes. BONES AND SOFT TISSUES: No acute osseous abnormality. IMPRESSION: 1. No acute cardiopulmonary process. Electronically signed by: Lynwood Seip MD 07/28/2024 05:58 PM EST RP Workstation: [id number]    Recent Labs: Lab Results  Component Value Date   WBC 7.9 07/28/2024   HGB 14.1 07/28/2024   PLT 278 07/28/2024   NA 140 07/28/2024   K 3.8 07/28/2024   CL 105 07/28/2024   CO2 26 07/28/2024   GLUCOSE 95 07/28/2024   BUN 12 07/28/2024   CREATININE 0.73 07/28/2024   BILITOT 0.2 07/14/2024   ALKPHOS 62 11/12/2022   AST 18 07/14/2024   ALT 22 07/14/2024   PROT 6.8 07/14/2024   PROT 6.9 07/14/2024   ALBUMIN 3.6 11/12/2022   CALCIUM 8.9 07/28/2024   GFRAA >60 01/09/2018   July 14, 2024 SPEP normal, ANA negative, ENA panel negative, C3-C4 normal, anticardiolipin negative, beta-2  GP 1 negative, sed rate 2, RF negative, anti-CCP negative, B12 normal, CK 87, vitamin D61  Speciality Comments: No specialty comments available.  Procedures:  No procedures performed Allergies: Topiramate  and Zonegran  [zonisamide ]   Assessment / Plan:     Visit Diagnoses: Polyarthralgia - History of joint pain since she was in her 30s.  History of discomfort in her entire spine, shoulders, wrists, knees and feet.  Labs obtained at the last visit including ANA, ENA panel, complements, anticardiolipin, beta-2  GP 1, sed rate, rheumatoid factor, anti-CCP were negative which were discussed with the patient and her husband.  Neck pain - trapezius spasm noted.  Range of motion exercises were emphasized.  Pes cavus of both feet - She for pes cavus and hammertoes.  You for support and metatarsal pads were discussed.  Raynaud's disease without gangrene - History of Raynaud's since she was in her 30s.  No nailbed capillary changes or sclerodactyly noted.  All autoimmune workup negative.  Lab results were discussed with the patient.  Anticardiolipin antibody, beta-2  GP 1 was  negative.  Fibromyalgia -she continues to have generalized muscle pain she had positive tender points and hyperalgesia.  Benefits of water  aerobics discussed.  Patient is looking into starting water  aerobics.  Isometric  exercises were discussed.  Other fatigue - She has fatigue since she was in her 78s.  Joint laxity-she has hypermobility in most of her joints.  Her children also have hypermobility.  Laxity of the skin was noted.  She may have EDS.  I will refer her to Dr. Joane.  Vitamin D  deficiency-vitamin D  was normal 61 on July 14, 2024.  She was advised to continue on the current dose of vitamin D .  Trigeminal neuralgia pain - Right trigeminal neuralgia since 2010.  She is followed by GI.  Mixed hyperlipidemia  Irritable bowel syndrome with both constipation and diarrhea  Primary insomnia  Fructose intolerance  Trichotillomania  Generalized anxiety disorder  Chronic migraine without aura, intractable, with status migrainosus  Attention deficit hyperactivity disorder (ADHD), combined type  Former smoker - She quit smoking in 2015.  She smoked 1 pack/day for about 22 years.  Orders: Orders Placed This Encounter  Procedures   AMB referral to sports medicine   No orders of the defined types were placed in this encounter.    Follow-Up Instructions: Return if symptoms worsen or fail to improve, for polyarthralgia.   Maya Nash, MD  Note - This record has been created using Animal nutritionist.  Chart creation errors have been sought, but may not always  have been located. Such creation errors do not reflect on  the standard of medical care. "

## 2024-08-02 ENCOUNTER — Other Ambulatory Visit: Payer: Self-pay | Admitting: Neurology

## 2024-08-02 ENCOUNTER — Ambulatory Visit: Admitting: Cardiology

## 2024-08-06 ENCOUNTER — Encounter: Payer: Self-pay | Admitting: Neurology

## 2024-08-13 ENCOUNTER — Ambulatory Visit: Attending: Rheumatology | Admitting: Rheumatology

## 2024-08-13 ENCOUNTER — Encounter: Payer: Self-pay | Admitting: Rheumatology

## 2024-08-13 VITALS — BP 95/63 | HR 86 | Temp 97.4°F | Resp 14 | Ht 64.0 in | Wt 179.8 lb

## 2024-08-13 DIAGNOSIS — M797 Fibromyalgia: Secondary | ICD-10-CM | POA: Diagnosis not present

## 2024-08-13 DIAGNOSIS — E782 Mixed hyperlipidemia: Secondary | ICD-10-CM

## 2024-08-13 DIAGNOSIS — F411 Generalized anxiety disorder: Secondary | ICD-10-CM

## 2024-08-13 DIAGNOSIS — M2550 Pain in unspecified joint: Secondary | ICD-10-CM | POA: Diagnosis not present

## 2024-08-13 DIAGNOSIS — I7300 Raynaud's syndrome without gangrene: Secondary | ICD-10-CM | POA: Diagnosis not present

## 2024-08-13 DIAGNOSIS — F902 Attention-deficit hyperactivity disorder, combined type: Secondary | ICD-10-CM

## 2024-08-13 DIAGNOSIS — M542 Cervicalgia: Secondary | ICD-10-CM

## 2024-08-13 DIAGNOSIS — M2520 Flail joint, unspecified joint: Secondary | ICD-10-CM

## 2024-08-13 DIAGNOSIS — G500 Trigeminal neuralgia: Secondary | ICD-10-CM

## 2024-08-13 DIAGNOSIS — E559 Vitamin D deficiency, unspecified: Secondary | ICD-10-CM | POA: Diagnosis not present

## 2024-08-13 DIAGNOSIS — E7410 Disorder of fructose metabolism, unspecified: Secondary | ICD-10-CM

## 2024-08-13 DIAGNOSIS — Z87891 Personal history of nicotine dependence: Secondary | ICD-10-CM

## 2024-08-13 DIAGNOSIS — G43711 Chronic migraine without aura, intractable, with status migrainosus: Secondary | ICD-10-CM

## 2024-08-13 DIAGNOSIS — F5101 Primary insomnia: Secondary | ICD-10-CM | POA: Diagnosis not present

## 2024-08-13 DIAGNOSIS — R5383 Other fatigue: Secondary | ICD-10-CM | POA: Diagnosis not present

## 2024-08-13 DIAGNOSIS — Q6672 Congenital pes cavus, left foot: Secondary | ICD-10-CM

## 2024-08-13 DIAGNOSIS — K582 Mixed irritable bowel syndrome: Secondary | ICD-10-CM | POA: Diagnosis not present

## 2024-08-13 DIAGNOSIS — M249 Joint derangement, unspecified: Secondary | ICD-10-CM

## 2024-08-13 DIAGNOSIS — Q6671 Congenital pes cavus, right foot: Secondary | ICD-10-CM | POA: Diagnosis not present

## 2024-08-13 DIAGNOSIS — F633 Trichotillomania: Secondary | ICD-10-CM

## 2024-08-17 NOTE — Progress Notes (Signed)
 "    Shari Pinal, MD Reason for referral-chest pain  HPI: 53 year old female for evaluation of chest pain at request of Olam Pinal, MD.  Exercise treadmill May 2017 normal.  Patient seen in the emergency room July 28, 2024 with chest pain.  Troponins were normal.  Chest x-ray showed no acute infiltrates.  Hemoglobin 14.1.  Creatinine 0.73.  Cardiology now asked to evaluate.  Patient states that on the day of her evaluation she developed right sided chest pain described as a squeezing sensation without radiation.  No associated nausea, diaphoresis or dyspnea.  Not pleuritic.  Lasted approximately 15 minutes then radiated to the left side lasting 2 to 3 hours.  She has had no chest pain since then.  She has some dyspnea on exertion but no orthopnea, PND, pedal edema or recent syncope.  Current Outpatient Medications  Medication Sig Dispense Refill   albuterol  (PROVENTIL ) (2.5 MG/3ML) 0.083% nebulizer solution Take 3 mLs (2.5 mg total) by nebulization every 6 (six) hours as needed for wheezing or shortness of breath. 75 mL 0   BOTOX  200 units injection INJECT 155 UNITS INTRAMUSCULARLY EVERY 3 MONTHS 1 each 3   carbamazepine  (TEGRETOL -XR) 200 MG 12 hr tablet Take 1 tablet (200 mg total) by mouth 2 (two) times daily. 60 tablet 3   clonazePAM  (KLONOPIN ) 0.5 MG tablet 1 tablet Orally Once a day As needed     Cyanocobalamin (VITAMIN B-12 PO) Take by mouth.     cyclobenzaprine  (FLEXERIL ) 5 MG tablet Take 1 tablet (5 mg total) by mouth every 8 (eight) hours as needed for muscle spasms. 90 tablet 11   desvenlafaxine (PRISTIQ) 100 MG 24 hr tablet Take 200 mg by mouth daily.     Erenumab -aooe (AIMOVIG ) 70 MG/ML SOAJ Inject 70 mg into the skin every 30 (thirty) days. 1.12 mL 11   estradiol (VIVELLE-DOT) 0.025 MG/24HR SMARTSIG:Patch T-DERMAL     Eszopiclone 3 MG TABS Take 3 mg by mouth at bedtime as needed.     FIBER PO Take by mouth.     lamoTRIgine  (LAMICTAL ) 100 MG tablet Take 100 mg by mouth  2 (two) times daily.     Multiple Vitamin (MULTIVITAMIN WITH MINERALS) TABS tablet Take 1 tablet by mouth daily.     naloxone (NARCAN) nasal spray 4 mg/0.1 mL SMARTSIG:Both Nares     oxyCODONE  ER (XTAMPZA  ER) 18 MG C12A Take by mouth. bid     Probiotic Product (PROBIOTIC PO) Take by mouth.     Rimegepant Sulfate (NURTEC) 75 MG TBDP Take 1 tablet (75 mg total) by mouth daily as needed. For migraines. Take as close to onset of migraine as possible. One daily maximum. 9 tablet 5   SUTAB [phone] MG TABS Take 1 tablet by mouth daily.     VITAMIN D  PO Take by mouth.     Vitamin D -Vitamin K (VITAMIN K2-VITAMIN D3 PO) Take by mouth.     WEGOVY  1.7 MG/0.75ML SOAJ      Current Facility-Administered Medications  Medication Dose Route Frequency Provider Last Rate Last Admin   botulinum toxin Type A  (BOTOX ) injection 200 Units  200 Units Intramuscular Once Onita Duos, MD        Allergies[1]   Past Medical History:  Diagnosis Date   Anxiety    Asthma    Hx with pregnancy only - No inhaler- no current problems   Bell's palsy    at age 3 & age 76, no current problems   Common migraine with intractable  migraine 07/29/2016   Depression    Fibromyalgia    IBS (irritable bowel syndrome)    diet controlled   Neuromuscular disorder (HCC)    trigeminal nuralga - not currently active   Trigeminal neuralgia     Past Surgical History:  Procedure Laterality Date   ABDOMINAL HYSTERECTOMY  2016   ABLATION     BREAST REDUCTION SURGERY Bilateral 08/12/2018   Procedure: BILATERAL MAMMARY REDUCTION  (BREAST);  Surgeon: Lowery Estefana RAMAN, DO;  Location: [facility];  Service: Plastics;  Laterality: Bilateral;   ceserean section x 3     COLONOSCOPY     CYSTOSCOPY N/A 06/12/2015   Procedure: CYSTOSCOPY;  Surgeon: Burnard Bowers, MD;  Location: [facility];  Service: Gynecology;  Laterality: N/A;   DILATION AND CURETTAGE OF UTERUS     LAPAROSCOPIC LYSIS OF ADHESIONS  01/12/2018    Procedure: LAPAROSCOPIC LYSIS OF ADHESIONS;  Surgeon: Bowers Burnard, MD;  Location: [facility];  Service: Gynecology;;   LAPAROSCOPY N/A 01/12/2018   Procedure: Diagnostic Laparoscopy;  Surgeon: Bowers Burnard, MD;  Location: [facility];  Service: Gynecology;  Laterality: N/A;   REDUCTION MAMMAPLASTY Bilateral    ROBOTIC ASSISTED LAPAROSCOPIC LYSIS OF ADHESION  06/12/2015   Procedure: ROBOTIC ASSISTED LAPAROSCOPIC LYSIS OF ADHESION;  Surgeon: Burnard Bowers, MD;  Location: [facility];  Service: Gynecology;;   si joint fusion Left 2019   WISDOM TOOTH EXTRACTION      Social History   Socioeconomic History   Marital status: Married    Spouse name: Not on file   Number of children: 4   Years of education: Master's   Highest education level: Not on file  Occupational History   Occupation: Geophysicist/field Seismologist and Language  Tobacco Use   Smoking status: Former    Current packs/day: 0.00    Average packs/day: 0.3 packs/day for 20.0 years (5.0 ttl pk-yrs)    Types: Cigarettes    Start date: 08/26/1996    Quit date: 08/26/2016    Years since quitting: 8.0    Passive exposure: Never   Smokeless tobacco: Never  Vaping Use   Vaping status: Never Used  Substance and Sexual Activity   Alcohol use: Yes    Comment: occ   Drug use: No   Sexual activity: Yes    Birth control/protection: Surgical  Other Topics Concern   Not on file  Social History Narrative   Lives at home w/ her husband and children   Right-handed   Caffeine : 1-2 cups per day   Social Drivers of Health   Tobacco Use: Medium Risk (08/27/2024)   Patient History    Smoking Tobacco Use: Former    Smokeless Tobacco Use: Never    Passive Exposure: Never  Physicist, Medical Strain: Low Risk (12/20/2023)   Received from [facility]   Overall Financial Resource Strain (CARDIA)    Difficulty of Paying Living Expenses: Not hard at all  Food Insecurity: No Food Insecurity (12/20/2023)   Received from [facility]   Epic    Within the  past 12 months, you worried that your food would run out before you got the money to buy more.: Never true    Within the past 12 months, the food you bought just didn't last and you didn't have money to get more.: Never true  Transportation Needs: No Transportation Needs (12/20/2023)   Received [facility] - Transportation    Lack of Transportation (Medical): No    Lack of  Transportation (Non-Medical): No  Physical Activity: Unknown (12/20/2023)   Received from [facility]   Exercise Vital Sign    On average, how many days per week do you engage in moderate to strenuous exercise (like a brisk walk)?: 0 days    Minutes of Exercise per Session: Not on file  Stress: Stress Concern Present (12/20/2023)   Received from [facility] - Occupational Stress Questionnaire    Feeling of Stress : Very much  Social Connections: Somewhat Isolated (12/20/2023)   Received from [facility]   Social Network    How would you rate your social network (family, work, friends)?: Restricted participation with some degree of social isolation  Intimate Partner Violence: Not At Risk (12/20/2023)   Received from [facility]   HITS    Over the last 12 months how often did your partner physically hurt you?: Never    Over the last 12 months how often did your partner insult you or talk down to you?: Never    Over the last 12 months how often did your partner threaten you with physical harm?: Never    Over the last 12 months how often did your partner scream or curse at you?: Never  Depression (PHQ2-9): Not on file  Alcohol Screen: Not on file  Housing: Low Risk (12/20/2023)   Received from [facility]    In the last 12 months, was there a time when you were not able to pay the mortgage or rent on time?: No    In the past 12 months, how many times have you moved where you were living?: 0    At any time in the past 12 months, were you homeless or  living in a shelter (including now)?: No  Utilities: Not At Risk (12/20/2023)   Received from [facility]ities    Threatened with loss of utilities: No  Health Literacy: Not on file    Family History  Problem Relation Age of Onset   High blood pressure Mother    Migraines Mother    Anxiety disorder Mother    Kidney disease Mother    High Cholesterol Father    Aortic stenosis Father    Arrhythmia Father        Pacemaker   Obesity Father    Coronary artery disease Father    Prostate cancer Brother    Anxiety disorder Brother    Depression Brother    Breast cancer Maternal Grandmother    Fibromyalgia Daughter    Colitis Daughter    Anxiety disorder Daughter    Anxiety disorder Daughter    Gallbladder disease Daughter        removed   Depression Daughter    Breast cancer Maternal Aunt    Lung cancer Maternal Aunt     ROS: no fevers or chills, productive cough, hemoptysis, dysphasia, odynophagia, melena, hematochezia, dysuria, hematuria, rash, seizure activity, orthopnea, PND, pedal edema, claudication. Remaining systems are negative.  Physical Exam:   Blood pressure 106/76, pulse 87, height 5' 4 (1.626 m), weight 181 lb 3.2 oz (82.2 kg), SpO2 95%.  General:  Well developed/well nourished in NAD Skin warm/dry Patient not depressed No peripheral clubbing Back-normal HEENT-normal/normal eyelids Neck supple/normal carotid upstroke bilaterally; no bruits; no JVD; no thyromegaly chest - CTA/ normal expansion CV - RRR/normal S1 and S2; no murmurs, rubs or gallops;  PMI nondisplaced Abdomen -NT/ND, no HSM, no mass, + bowel  sounds, no bruit 2+ femoral pulses, no bruits Ext-no edema, chords, 2+ DP Neuro-grossly nonfocal  ECG -July 28, 2024-normal sinus rhythm with nonspecific ST changes.  Personally reviewed  A/P  1 chest pain-symptoms are atypical.  Will plan coronary CTA to rule out obstructive coronary disease.  2 history of fibromyalgia-per primary  care.  Redell Shallow, MD     [1]  Allergies Allergen Reactions   Topiramate  Other (See Comments)    Tingling  Other Reaction(s): Not available  topiramate    Zonegran  [Zonisamide ] Itching   "

## 2024-08-27 ENCOUNTER — Ambulatory Visit: Attending: Cardiology | Admitting: Cardiology

## 2024-08-27 ENCOUNTER — Encounter: Payer: Self-pay | Admitting: Cardiology

## 2024-08-27 VITALS — BP 106/76 | HR 87 | Ht 64.0 in | Wt 181.2 lb

## 2024-08-27 DIAGNOSIS — R072 Precordial pain: Secondary | ICD-10-CM | POA: Diagnosis not present

## 2024-08-27 MED ORDER — METOPROLOL TARTRATE 25 MG PO TABS: ORAL_TABLET | ORAL | 0 refills | Status: AC

## 2024-08-27 NOTE — Patient Instructions (Addendum)
" ° °  Follow-Up: At [facility], you and your health needs are our priority.  As part of our continuing mission to provide you with exceptional heart care, our providers are all part of one team.  This team includes your primary Cardiologist (physician) and Advanced Practice Providers or APPs (Physician Assistants and Nurse Practitioners) who all work together to provide you with the care you need, when you need it.  Your next appointment:    AS NEEDED  Other Instructions    Your cardiac CT will be scheduled at     [facility] [address]  [place], [place] [address]   If scheduled at the Heart and Vascular Tower at [address], please enter the parking lot using the [address] entrance and use the FREE valet service at the patient drop-off area. Enter the building and check-in with registration on the main floor.  Please follow these instructions carefully (unless otherwise directed):  An IV will be required for this test and Nitroglycerin  will be given.   On the Night Before the Test: Be sure to Drink plenty of water . Do not consume any caffeinated/decaffeinated beverages or chocolate 12 hours prior to your test. Do not take any antihistamines 12 hours prior to your test.  On the Day of the Test: Drink plenty of water  until 1 hour prior to the test. Do not eat any food 1 hour prior to test. You may take your regular medications prior to the test.  Take metoprolol (Lopressor) 25 MG two hours prior to test. FEMALES- please wear underwire-free bra if available, avoid dresses & tight clothing      After the Test: Drink plenty of water . After receiving IV contrast, you may experience a mild flushed feeling. This is normal. On occasion, you may experience a mild rash up to 24 hours after the test. This is not dangerous. If this occurs, you can take Benadryl 25 mg, Zyrtec, Claritin, or Allegra and increase your fluid intake. (Patients  taking Tikosyn should avoid Benadryl, and may take Zyrtec, Claritin, or Allegra) If you experience trouble breathing, this can be serious. If it is severe call 911 IMMEDIATELY. If it is mild, please call our office.  We will call to schedule your test 2-4 weeks out understanding that some insurance companies will need an authorization prior to the service being performed.   For more information and frequently asked questions, please visit our website : [email or web address]  For non-scheduling related questions, please contact the cardiac imaging nurse navigator should you have any questions/concerns: Cardiac Imaging Nurse Navigators Direct Office Dial: [phone]   For scheduling needs, including cancellations and rescheduling, please call Brittany, [phone].          "

## 2024-08-31 ENCOUNTER — Ambulatory Visit: Admitting: Family Medicine

## 2024-08-31 ENCOUNTER — Telehealth: Payer: Self-pay | Admitting: Cardiology

## 2024-08-31 VITALS — BP 102/60 | HR 86 | Ht 64.0 in | Wt 181.0 lb

## 2024-08-31 DIAGNOSIS — Q7962 Hypermobile Ehlers-Danlos syndrome: Secondary | ICD-10-CM | POA: Diagnosis not present

## 2024-08-31 DIAGNOSIS — F32A Depression, unspecified: Secondary | ICD-10-CM | POA: Insufficient documentation

## 2024-08-31 DIAGNOSIS — R209 Unspecified disturbances of skin sensation: Secondary | ICD-10-CM | POA: Insufficient documentation

## 2024-08-31 DIAGNOSIS — R1032 Left lower quadrant pain: Secondary | ICD-10-CM | POA: Insufficient documentation

## 2024-08-31 DIAGNOSIS — F419 Anxiety disorder, unspecified: Secondary | ICD-10-CM | POA: Insufficient documentation

## 2024-08-31 DIAGNOSIS — R1084 Generalized abdominal pain: Secondary | ICD-10-CM | POA: Insufficient documentation

## 2024-08-31 DIAGNOSIS — R202 Paresthesia of skin: Secondary | ICD-10-CM | POA: Insufficient documentation

## 2024-08-31 DIAGNOSIS — E669 Obesity, unspecified: Secondary | ICD-10-CM | POA: Insufficient documentation

## 2024-08-31 DIAGNOSIS — K529 Noninfective gastroenteritis and colitis, unspecified: Secondary | ICD-10-CM | POA: Insufficient documentation

## 2024-08-31 DIAGNOSIS — E663 Overweight: Secondary | ICD-10-CM | POA: Insufficient documentation

## 2024-08-31 DIAGNOSIS — Z8669 Personal history of other diseases of the nervous system and sense organs: Secondary | ICD-10-CM | POA: Insufficient documentation

## 2024-08-31 DIAGNOSIS — F411 Generalized anxiety disorder: Secondary | ICD-10-CM | POA: Insufficient documentation

## 2024-08-31 DIAGNOSIS — R198 Other specified symptoms and signs involving the digestive system and abdomen: Secondary | ICD-10-CM | POA: Insufficient documentation

## 2024-08-31 DIAGNOSIS — Z87891 Personal history of nicotine dependence: Secondary | ICD-10-CM | POA: Insufficient documentation

## 2024-08-31 DIAGNOSIS — Z83719 Family history of colon polyps, unspecified: Secondary | ICD-10-CM | POA: Insufficient documentation

## 2024-08-31 DIAGNOSIS — K589 Irritable bowel syndrome without diarrhea: Secondary | ICD-10-CM | POA: Insufficient documentation

## 2024-08-31 NOTE — Telephone Encounter (Signed)
 Patient is having a colonoscopy and then having a CT cardiac morph two days later.  She wants to make sure that would be okay, or does she need to reschedule the colonoscopy. Please advise

## 2024-08-31 NOTE — Telephone Encounter (Signed)
 Pt is no longer getting this per previous message.

## 2024-08-31 NOTE — Addendum Note (Signed)
 Addended by: NEYSA PARTICIA RAMAN on: 08/31/2024 11:45 AM   Modules accepted: Orders

## 2024-08-31 NOTE — Patient Instructions (Signed)
 Thank you for coming in today.   Check out the book Disjointed Navigating the Diagnosis and Management of Hypermobile Ehlers-Danlos Syndrome and Hypermobility Spectrum Disorders.    For POTS symptoms: Consume 3 L water and 8-12 gram of salt per day   Check out these websites for exercises to try if you have POTS ([employer]OP Protocol, [place] Protocol, and [employer] Protocol) Https://www.taylor-robbins.com/ https://dean.info/   Guide to Prof. 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 A representative from the physical therapy office will contact you to coordinate scheduling after confirming your benefits with your insurance provider. If you do not hear from the physical therapy office within the next 1-2 weeks, please let us  know.   See you back in 1 month

## 2024-08-31 NOTE — Telephone Encounter (Signed)
Left message for patient with Dr Creshaw's recommendations.   

## 2024-08-31 NOTE — Telephone Encounter (Signed)
 [employer] sent a fax requesting Botox  refills.

## 2024-08-31 NOTE — Progress Notes (Unsigned)
"       ° °  I, Claretha Schimke am a scribe for Dr. Artist Lloyd, MD.  Shari Booth is a 54 y.o. 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 Hx of fibromyalgia and is under the care of Dr. Dolphus.  MS: Chronic joint pain and Chronic wide spread muscle pain, chronic neck and back pain, polyarthralgia,  Skin/Immune reactions: Hyperextensible/ stretchy Skin, Fragile Skin that tears easily, Poor Wound Healing, Easy Bruising / Bleeding, Atrophic Scars, Rashes / Hives, and Medication / Chemical / Food Sensitivities  Neurological: Headache  Migraine and Cognitive Dysfunction / Brain Fog ANS: Fatigue, Raynaud's , and Anxiety / Panic Respiratory: Dyspnea and Chest Tightness CV: {EDSCARDIO:33816}none GI:  GERD and Painful ABD Bloating Genitourinary: Pelvic Pain / Fullness / Pressure Hands & Feet: Raynaud's Phenomenon   Treatments tried: Chiropractor, PT, Massage, Neuro surgeons,Neurologist, acupuncture, dry needling, meds, pain clinics, Cardiologist, GI doctor, phychiatric care     Patient states she is really dizzy from moving her head. Constipation and diarrhea.   Pertinent review of systems: ***  Relevant historical information: IBS, Trigeminal Neurologia Aug 2024, TMJ,  neck ablastin, Fibromyalgia, Raynauds   Exam:  There were no vitals taken for this visit. General: Well Developed, well nourished, and in no acute distress.   MSK: ***    Lab and Radiology Results No results found for this or any previous visit (from the past 72 hours). No results found.     Assessment and Plan: 54 y.o. female with ***   PDMP not reviewed this encounter. No orders of the defined types were placed in this encounter.  No orders of the defined types were placed in this encounter.    Discussed warning signs or symptoms. Please see discharge instructions. Patient expresses understanding.   ***  "

## 2024-09-01 ENCOUNTER — Encounter: Payer: Self-pay | Admitting: Family Medicine

## 2024-09-01 DIAGNOSIS — Q7962 Hypermobile Ehlers-Danlos syndrome: Secondary | ICD-10-CM | POA: Insufficient documentation

## 2024-09-07 ENCOUNTER — Telehealth (HOSPITAL_COMMUNITY): Payer: Self-pay | Admitting: Emergency Medicine

## 2024-09-07 NOTE — Telephone Encounter (Signed)
 Reaching out to patient to offer assistance regarding upcoming cardiac imaging study; pt verbalizes understanding of appt date/time, parking situation and where to check in, pre-test NPO status and medications ordered, and verified current allergies; name and call back number provided for further questions should they arise Rockwell Alexandria RN Navigator Cardiac Imaging [facility] [phone] office [phone] cell

## 2024-09-08 ENCOUNTER — Ambulatory Visit (HOSPITAL_COMMUNITY)
Admission: RE | Admit: 2024-09-08 | Discharge: 2024-09-08 | Disposition: A | Discharge status: Home / Self Care | Source: Ambulatory Visit | Attending: Cardiology | Admitting: Cardiology

## 2024-09-08 DIAGNOSIS — R072 Precordial pain: Secondary | ICD-10-CM | POA: Insufficient documentation

## 2024-09-08 MED ORDER — NITROGLYCERIN 0.4 MG SL SUBL
SUBLINGUAL_TABLET | SUBLINGUAL | Status: AC
  Filled 2024-09-08: qty 2

## 2024-09-08 MED ORDER — IOHEXOL 350 MG/ML SOLN
100.0000 mL | Freq: Once | INTRAVENOUS | Status: AC | PRN
  Administered 2024-09-08: 100 mL via INTRAVENOUS

## 2024-09-08 MED ORDER — NITROGLYCERIN 0.4 MG SL SUBL
0.8000 mg | SUBLINGUAL_TABLET | Freq: Once | SUBLINGUAL | Status: AC
  Administered 2024-09-08: 0.8 mg via SUBLINGUAL

## 2024-09-09 ENCOUNTER — Ambulatory Visit: Payer: Self-pay | Admitting: Cardiology

## 2024-09-09 DIAGNOSIS — I272 Pulmonary hypertension, unspecified: Secondary | ICD-10-CM

## 2024-09-14 ENCOUNTER — Telehealth: Payer: Self-pay | Admitting: Neurology

## 2024-09-14 NOTE — Telephone Encounter (Signed)
 Pt called to cancel appt   Due to conflict   Appt Cancelled

## 2024-09-15 ENCOUNTER — Telehealth: Payer: Self-pay | Admitting: *Deleted

## 2024-09-15 NOTE — Telephone Encounter (Signed)
 Received OV from Dr Franky Sprinkles at [facility]. Visit date: 09/09/24. Note placed in Dr Georgianne office for review.

## 2024-09-27 ENCOUNTER — Encounter: Payer: Self-pay | Admitting: Family Medicine

## 2024-09-29 ENCOUNTER — Ambulatory Visit: Admitting: Pulmonary Disease

## 2024-09-30 ENCOUNTER — Ambulatory Visit: Admitting: Neurology

## 2024-09-30 ENCOUNTER — Ambulatory Visit: Admitting: Adult Health

## 2024-10-07 ENCOUNTER — Ambulatory Visit: Admitting: Family Medicine

## 2024-10-12 ENCOUNTER — Ambulatory Visit: Admitting: Pulmonary Disease

## 2024-10-21 ENCOUNTER — Ambulatory Visit: Admitting: Family Medicine
